# Patient Record
Sex: Female | Born: 1945 | Race: White | Hispanic: No | Smoking: Former smoker
Health system: Southern US, Community
[De-identification: ages and names within clinical notes are randomized; demographics above are authoritative.]

## PROBLEM LIST (undated history)

## (undated) DIAGNOSIS — K5790 Diverticulosis of intestine, part unspecified, without perforation or abscess without bleeding: Secondary | ICD-10-CM

## (undated) DIAGNOSIS — R32 Unspecified urinary incontinence: Secondary | ICD-10-CM

## (undated) DIAGNOSIS — Z86018 Personal history of other benign neoplasm: Secondary | ICD-10-CM

## (undated) DIAGNOSIS — K219 Gastro-esophageal reflux disease without esophagitis: Secondary | ICD-10-CM

## (undated) DIAGNOSIS — M810 Age-related osteoporosis without current pathological fracture: Secondary | ICD-10-CM

## (undated) DIAGNOSIS — T783XXA Angioneurotic edema, initial encounter: Secondary | ICD-10-CM

## (undated) DIAGNOSIS — G4730 Sleep apnea, unspecified: Secondary | ICD-10-CM

## (undated) DIAGNOSIS — G4733 Obstructive sleep apnea (adult) (pediatric): Secondary | ICD-10-CM

## (undated) DIAGNOSIS — K449 Diaphragmatic hernia without obstruction or gangrene: Secondary | ICD-10-CM

## (undated) DIAGNOSIS — K635 Polyp of colon: Secondary | ICD-10-CM

## (undated) DIAGNOSIS — L719 Rosacea, unspecified: Secondary | ICD-10-CM

## (undated) DIAGNOSIS — F329 Major depressive disorder, single episode, unspecified: Secondary | ICD-10-CM

## (undated) DIAGNOSIS — I10 Essential (primary) hypertension: Secondary | ICD-10-CM

## (undated) DIAGNOSIS — E7800 Pure hypercholesterolemia, unspecified: Secondary | ICD-10-CM

## (undated) HISTORY — DX: Gastro-esophageal reflux disease without esophagitis: K21.9

## (undated) HISTORY — DX: Obstructive sleep apnea (adult) (pediatric): G47.33

## (undated) HISTORY — DX: Pure hypercholesterolemia, unspecified: E78.00

## (undated) HISTORY — DX: Rosacea, unspecified: L71.9

## (undated) HISTORY — PX: CATARACT EXTRACTION: SUR2

## (undated) HISTORY — DX: Major depressive disorder, single episode, unspecified: F32.9

## (undated) HISTORY — DX: Polyp of colon: K63.5

## (undated) HISTORY — DX: Angioneurotic edema, initial encounter: T78.3XXA

## (undated) HISTORY — DX: Essential (primary) hypertension: I10

## (undated) HISTORY — DX: Age-related osteoporosis without current pathological fracture: M81.0

## (undated) HISTORY — DX: Unspecified urinary incontinence: R32

## (undated) HISTORY — DX: Sleep apnea, unspecified: G47.30

## (undated) HISTORY — PX: SKIN CANCER EXCISION: SHX779

## (undated) HISTORY — DX: Diaphragmatic hernia without obstruction or gangrene: K44.9

## (undated) HISTORY — PX: KNEE ARTHROSCOPY: SUR90

## (undated) HISTORY — PX: BUNIONECTOMY: SHX129

## (undated) HISTORY — DX: Personal history of other benign neoplasm: Z86.018

## (undated) HISTORY — PX: CHOLECYSTECTOMY: SHX55

## (undated) HISTORY — DX: Diverticulosis of intestine, part unspecified, without perforation or abscess without bleeding: K57.90

---

## 1978-12-19 HISTORY — PX: OTHER SURGICAL HISTORY: SHX169

## 1999-11-23 ENCOUNTER — Other Ambulatory Visit: Admission: RE | Admit: 1999-11-23 | Discharge: 1999-11-23 | Payer: Self-pay | Admitting: Internal Medicine

## 2001-03-02 ENCOUNTER — Other Ambulatory Visit: Admission: RE | Admit: 2001-03-02 | Discharge: 2001-03-02 | Payer: Self-pay | Admitting: Internal Medicine

## 2001-03-06 ENCOUNTER — Encounter (INDEPENDENT_AMBULATORY_CARE_PROVIDER_SITE_OTHER): Payer: Self-pay | Admitting: *Deleted

## 2001-03-06 ENCOUNTER — Ambulatory Visit (HOSPITAL_COMMUNITY): Admission: RE | Admit: 2001-03-06 | Discharge: 2001-03-06 | Payer: Self-pay | Admitting: Internal Medicine

## 2001-03-13 ENCOUNTER — Ambulatory Visit (HOSPITAL_COMMUNITY): Admission: RE | Admit: 2001-03-13 | Discharge: 2001-03-13 | Payer: Self-pay | Admitting: Internal Medicine

## 2001-03-13 ENCOUNTER — Encounter: Payer: Self-pay | Admitting: Internal Medicine

## 2001-03-13 ENCOUNTER — Encounter (INDEPENDENT_AMBULATORY_CARE_PROVIDER_SITE_OTHER): Payer: Self-pay | Admitting: *Deleted

## 2001-04-19 HISTORY — PX: TUMOR REMOVAL: SHX12

## 2001-05-15 ENCOUNTER — Encounter: Payer: Self-pay | Admitting: Surgery

## 2001-05-23 ENCOUNTER — Inpatient Hospital Stay (HOSPITAL_COMMUNITY): Admission: RE | Admit: 2001-05-23 | Discharge: 2001-05-27 | Payer: Self-pay | Admitting: Surgery

## 2001-05-23 ENCOUNTER — Encounter (INDEPENDENT_AMBULATORY_CARE_PROVIDER_SITE_OTHER): Payer: Self-pay | Admitting: Specialist

## 2001-11-03 LAB — HM COLONOSCOPY

## 2002-05-09 ENCOUNTER — Encounter: Payer: Self-pay | Admitting: Surgery

## 2002-05-09 ENCOUNTER — Encounter: Admission: RE | Admit: 2002-05-09 | Discharge: 2002-05-09 | Payer: Self-pay | Admitting: Surgery

## 2002-08-02 ENCOUNTER — Other Ambulatory Visit: Admission: RE | Admit: 2002-08-02 | Discharge: 2002-08-02 | Payer: Self-pay | Admitting: Endocrinology

## 2002-08-21 ENCOUNTER — Ambulatory Visit (HOSPITAL_BASED_OUTPATIENT_CLINIC_OR_DEPARTMENT_OTHER): Admission: RE | Admit: 2002-08-21 | Discharge: 2002-08-21 | Payer: Self-pay | Admitting: Endocrinology

## 2002-11-15 ENCOUNTER — Ambulatory Visit (HOSPITAL_BASED_OUTPATIENT_CLINIC_OR_DEPARTMENT_OTHER): Admission: RE | Admit: 2002-11-15 | Discharge: 2002-11-15 | Payer: Self-pay | Admitting: Pulmonary Disease

## 2002-12-14 ENCOUNTER — Encounter: Payer: Self-pay | Admitting: Endocrinology

## 2002-12-14 ENCOUNTER — Encounter: Admission: RE | Admit: 2002-12-14 | Discharge: 2002-12-14 | Payer: Self-pay | Admitting: Endocrinology

## 2003-12-24 ENCOUNTER — Encounter: Admission: RE | Admit: 2003-12-24 | Discharge: 2003-12-24 | Payer: Self-pay | Admitting: Endocrinology

## 2004-11-23 ENCOUNTER — Ambulatory Visit (HOSPITAL_COMMUNITY): Admission: RE | Admit: 2004-11-23 | Discharge: 2004-11-23 | Payer: Self-pay | Admitting: Orthopedic Surgery

## 2005-01-12 ENCOUNTER — Ambulatory Visit: Payer: Self-pay | Admitting: Endocrinology

## 2005-01-19 ENCOUNTER — Ambulatory Visit: Payer: Self-pay | Admitting: Endocrinology

## 2005-01-28 ENCOUNTER — Ambulatory Visit: Payer: Self-pay | Admitting: Endocrinology

## 2005-02-05 ENCOUNTER — Encounter: Admission: RE | Admit: 2005-02-05 | Discharge: 2005-02-05 | Payer: Self-pay | Admitting: Endocrinology

## 2005-02-09 ENCOUNTER — Ambulatory Visit: Payer: Self-pay | Admitting: Pulmonary Disease

## 2006-01-31 ENCOUNTER — Ambulatory Visit: Payer: Self-pay | Admitting: Endocrinology

## 2006-01-31 LAB — CONVERTED CEMR LAB
Albumin: 3.9 g/dL (ref 3.5–5.2)
Basophils Absolute: 0 10*3/uL (ref 0.0–0.1)
CO2: 31 meq/L (ref 19–32)
Chol/HDL Ratio, serum: 2.3
Creatinine, Ser: 0.8 mg/dL (ref 0.4–1.2)
Hemoglobin: 14 g/dL (ref 12.0–15.0)
Ketones, ur: NEGATIVE mg/dL
LDL Cholesterol: 52 mg/dL (ref 0–99)
MCHC: 33.6 g/dL (ref 30.0–36.0)
Monocytes Absolute: 0.7 10*3/uL (ref 0.2–0.7)
Monocytes Relative: 14.4 % — ABNORMAL HIGH (ref 3.0–11.0)
Neutro Abs: 2.5 10*3/uL (ref 1.4–7.7)
Neutrophils Relative %: 47.8 % (ref 43.0–77.0)
Nitrite: NEGATIVE
RBC: 4.36 M/uL (ref 3.87–5.11)
RDW: 12.9 % (ref 11.5–14.6)
Sodium: 142 meq/L (ref 135–145)
Specific Gravity, Urine: 1.02 (ref 1.000–1.03)
Total Bilirubin: 0.7 mg/dL (ref 0.3–1.2)
Total Protein, Urine: NEGATIVE mg/dL
Urine Glucose: NEGATIVE mg/dL

## 2006-02-07 ENCOUNTER — Encounter: Admission: RE | Admit: 2006-02-07 | Discharge: 2006-02-07 | Payer: Self-pay | Admitting: Endocrinology

## 2006-02-07 ENCOUNTER — Ambulatory Visit: Payer: Self-pay | Admitting: Endocrinology

## 2006-02-07 HISTORY — PX: ELECTROCARDIOGRAM: SHX264

## 2006-02-14 ENCOUNTER — Encounter: Payer: Self-pay | Admitting: Endocrinology

## 2006-02-14 ENCOUNTER — Ambulatory Visit: Payer: Self-pay

## 2006-02-14 HISTORY — PX: OTHER SURGICAL HISTORY: SHX169

## 2006-02-22 ENCOUNTER — Encounter: Admission: RE | Admit: 2006-02-22 | Discharge: 2006-02-22 | Payer: Self-pay | Admitting: Endocrinology

## 2006-04-18 ENCOUNTER — Ambulatory Visit: Payer: Self-pay | Admitting: Endocrinology

## 2006-07-18 ENCOUNTER — Ambulatory Visit: Payer: Self-pay | Admitting: Endocrinology

## 2006-08-27 ENCOUNTER — Ambulatory Visit: Payer: Self-pay | Admitting: Family Medicine

## 2006-10-19 ENCOUNTER — Ambulatory Visit: Payer: Self-pay | Admitting: Internal Medicine

## 2006-11-17 ENCOUNTER — Encounter: Payer: Self-pay | Admitting: Internal Medicine

## 2006-11-17 ENCOUNTER — Ambulatory Visit: Payer: Self-pay | Admitting: Internal Medicine

## 2006-11-17 HISTORY — PX: ESOPHAGOGASTRODUODENOSCOPY: SHX1529

## 2006-11-30 ENCOUNTER — Ambulatory Visit (HOSPITAL_BASED_OUTPATIENT_CLINIC_OR_DEPARTMENT_OTHER): Admission: RE | Admit: 2006-11-30 | Discharge: 2006-11-30 | Payer: Self-pay | Admitting: Orthopedic Surgery

## 2006-12-03 ENCOUNTER — Encounter: Payer: Self-pay | Admitting: Endocrinology

## 2006-12-03 DIAGNOSIS — F329 Major depressive disorder, single episode, unspecified: Secondary | ICD-10-CM

## 2006-12-03 DIAGNOSIS — E039 Hypothyroidism, unspecified: Secondary | ICD-10-CM

## 2006-12-03 DIAGNOSIS — R32 Unspecified urinary incontinence: Secondary | ICD-10-CM

## 2006-12-03 DIAGNOSIS — F325 Major depressive disorder, single episode, in full remission: Secondary | ICD-10-CM

## 2006-12-03 DIAGNOSIS — Z860100 Personal history of colon polyps, unspecified: Secondary | ICD-10-CM | POA: Insufficient documentation

## 2006-12-03 DIAGNOSIS — I10 Essential (primary) hypertension: Secondary | ICD-10-CM

## 2006-12-03 DIAGNOSIS — N3281 Overactive bladder: Secondary | ICD-10-CM

## 2006-12-03 DIAGNOSIS — M810 Age-related osteoporosis without current pathological fracture: Secondary | ICD-10-CM

## 2006-12-03 DIAGNOSIS — K635 Polyp of colon: Secondary | ICD-10-CM

## 2006-12-03 DIAGNOSIS — F3289 Other specified depressive episodes: Secondary | ICD-10-CM

## 2006-12-03 HISTORY — DX: Age-related osteoporosis without current pathological fracture: M81.0

## 2006-12-03 HISTORY — DX: Major depressive disorder, single episode, unspecified: F32.9

## 2006-12-03 HISTORY — DX: Polyp of colon: K63.5

## 2006-12-03 HISTORY — DX: Unspecified urinary incontinence: R32

## 2006-12-03 HISTORY — DX: Essential (primary) hypertension: I10

## 2006-12-03 HISTORY — DX: Other specified depressive episodes: F32.89

## 2007-02-27 ENCOUNTER — Ambulatory Visit: Payer: Self-pay | Admitting: Endocrinology

## 2007-02-27 LAB — CONVERTED CEMR LAB
ALT: 27 units/L (ref 0–35)
AST: 28 units/L (ref 0–37)
Basophils Relative: 0.9 % (ref 0.0–1.0)
Bilirubin, Direct: 0.1 mg/dL (ref 0.0–0.3)
CO2: 31 meq/L (ref 19–32)
Calcium: 9.3 mg/dL (ref 8.4–10.5)
Chloride: 103 meq/L (ref 96–112)
Cholesterol: 142 mg/dL (ref 0–200)
Creatinine, Ser: 0.6 mg/dL (ref 0.4–1.2)
Eosinophils Relative: 4.5 % (ref 0.0–5.0)
Glucose, Bld: 94 mg/dL (ref 70–99)
HCT: 39 % (ref 36.0–46.0)
Ketones, ur: NEGATIVE mg/dL
LDL Cholesterol: 61 mg/dL (ref 0–99)
MCV: 93 fL (ref 78.0–100.0)
Neutrophils Relative %: 48.3 % (ref 43.0–77.0)
Nitrite: NEGATIVE
RBC: 4.19 M/uL (ref 3.87–5.11)
RDW: 12.7 % (ref 11.5–14.6)
Sodium: 141 meq/L (ref 135–145)
Specific Gravity, Urine: 1.02 (ref 1.000–1.03)
TSH: 3.64 microintl units/mL (ref 0.35–5.50)
Total Bilirubin: 0.7 mg/dL (ref 0.3–1.2)
Total Protein, Urine: NEGATIVE mg/dL
Urobilinogen, UA: 0.2 (ref 0.0–1.0)
WBC: 4.1 10*3/uL — ABNORMAL LOW (ref 4.5–10.5)
pH: 6 (ref 5.0–8.0)

## 2007-03-03 ENCOUNTER — Ambulatory Visit: Payer: Self-pay | Admitting: Endocrinology

## 2007-03-03 ENCOUNTER — Other Ambulatory Visit: Admission: RE | Admit: 2007-03-03 | Discharge: 2007-03-03 | Payer: Self-pay | Admitting: Endocrinology

## 2007-03-03 ENCOUNTER — Encounter: Payer: Self-pay | Admitting: Endocrinology

## 2007-03-03 DIAGNOSIS — K219 Gastro-esophageal reflux disease without esophagitis: Secondary | ICD-10-CM | POA: Insufficient documentation

## 2007-03-03 HISTORY — DX: Gastro-esophageal reflux disease without esophagitis: K21.9

## 2007-03-09 ENCOUNTER — Ambulatory Visit: Payer: Self-pay | Admitting: Internal Medicine

## 2007-03-09 ENCOUNTER — Encounter: Payer: Self-pay | Admitting: Endocrinology

## 2007-04-21 ENCOUNTER — Encounter: Admission: RE | Admit: 2007-04-21 | Discharge: 2007-04-21 | Payer: Self-pay | Admitting: Endocrinology

## 2008-01-06 ENCOUNTER — Ambulatory Visit: Payer: Self-pay | Admitting: Family Medicine

## 2008-01-16 ENCOUNTER — Ambulatory Visit: Payer: Self-pay | Admitting: Endocrinology

## 2008-02-27 ENCOUNTER — Ambulatory Visit: Payer: Self-pay | Admitting: Endocrinology

## 2008-02-28 LAB — CONVERTED CEMR LAB
ALT: 28 units/L (ref 0–35)
AST: 26 units/L (ref 0–37)
Albumin: 4 g/dL (ref 3.5–5.2)
Alkaline Phosphatase: 64 units/L (ref 39–117)
BUN: 17 mg/dL (ref 6–23)
Bilirubin Urine: NEGATIVE
Bilirubin, Direct: 0.1 mg/dL (ref 0.0–0.3)
CO2: 30 meq/L (ref 19–32)
Chloride: 107 meq/L (ref 96–112)
Eosinophils Relative: 4.9 % (ref 0.0–5.0)
Glucose, Bld: 99 mg/dL (ref 70–99)
HCT: 39.9 % (ref 36.0–46.0)
Hemoglobin, Urine: NEGATIVE
Leukocytes, UA: NEGATIVE
Lymphocytes Relative: 33.6 % (ref 12.0–46.0)
Monocytes Absolute: 0.7 10*3/uL (ref 0.1–1.0)
Monocytes Relative: 12.3 % — ABNORMAL HIGH (ref 3.0–12.0)
Nitrite: NEGATIVE
Platelets: 219 10*3/uL (ref 150–400)
Potassium: 4.4 meq/L (ref 3.5–5.1)
RDW: 12.9 % (ref 11.5–14.6)
Sodium: 143 meq/L (ref 135–145)
Total CHOL/HDL Ratio: 3.1
Total Protein, Urine: NEGATIVE mg/dL
Total Protein: 6.5 g/dL (ref 6.0–8.3)
VLDL: 38 mg/dL (ref 0–40)
WBC: 5.3 10*3/uL (ref 4.5–10.5)
pH: 5.5 (ref 5.0–8.0)

## 2008-03-04 ENCOUNTER — Ambulatory Visit: Payer: Self-pay | Admitting: Endocrinology

## 2008-03-19 ENCOUNTER — Encounter: Payer: Self-pay | Admitting: Endocrinology

## 2008-05-11 ENCOUNTER — Ambulatory Visit: Payer: Self-pay | Admitting: Family Medicine

## 2008-05-14 ENCOUNTER — Encounter: Admission: RE | Admit: 2008-05-14 | Discharge: 2008-05-14 | Payer: Self-pay | Admitting: Endocrinology

## 2008-05-22 ENCOUNTER — Telehealth (INDEPENDENT_AMBULATORY_CARE_PROVIDER_SITE_OTHER): Payer: Self-pay | Admitting: *Deleted

## 2008-05-27 ENCOUNTER — Ambulatory Visit: Payer: Self-pay | Admitting: Endocrinology

## 2008-08-22 ENCOUNTER — Encounter: Payer: Self-pay | Admitting: Endocrinology

## 2008-09-04 ENCOUNTER — Telehealth (INDEPENDENT_AMBULATORY_CARE_PROVIDER_SITE_OTHER): Payer: Self-pay | Admitting: *Deleted

## 2008-09-13 ENCOUNTER — Encounter (INDEPENDENT_AMBULATORY_CARE_PROVIDER_SITE_OTHER): Payer: Self-pay | Admitting: *Deleted

## 2008-12-14 ENCOUNTER — Ambulatory Visit: Payer: Self-pay | Admitting: Internal Medicine

## 2009-02-28 ENCOUNTER — Ambulatory Visit: Payer: Self-pay | Admitting: Endocrinology

## 2009-03-05 ENCOUNTER — Ambulatory Visit: Payer: Self-pay | Admitting: Endocrinology

## 2009-03-06 LAB — CONVERTED CEMR LAB
ALT: 38 units/L — ABNORMAL HIGH (ref 0–35)
Albumin: 4.2 g/dL (ref 3.5–5.2)
Alkaline Phosphatase: 69 units/L (ref 39–117)
Basophils Relative: 0.1 % (ref 0.0–3.0)
Bilirubin Urine: NEGATIVE
Bilirubin, Direct: 0.1 mg/dL (ref 0.0–0.3)
CO2: 28 meq/L (ref 19–32)
Chloride: 107 meq/L (ref 96–112)
Creatinine, Ser: 0.6 mg/dL (ref 0.4–1.2)
Eosinophils Relative: 0.1 % (ref 0.0–5.0)
Hemoglobin, Urine: NEGATIVE
Hemoglobin: 13.6 g/dL (ref 12.0–15.0)
LDL Cholesterol: 71 mg/dL (ref 0–99)
Lymphocytes Relative: 11.1 % — ABNORMAL LOW (ref 12.0–46.0)
MCV: 96.6 fL (ref 78.0–100.0)
Monocytes Absolute: 1.1 10*3/uL — ABNORMAL HIGH (ref 0.1–1.0)
Neutro Abs: 12.6 10*3/uL — ABNORMAL HIGH (ref 1.4–7.7)
Neutrophils Relative %: 81.3 % — ABNORMAL HIGH (ref 43.0–77.0)
Potassium: 4.5 meq/L (ref 3.5–5.1)
RBC: 4.11 M/uL (ref 3.87–5.11)
Sodium: 144 meq/L (ref 135–145)
Total CHOL/HDL Ratio: 2
Total Protein, Urine: NEGATIVE mg/dL
Total Protein: 6.9 g/dL (ref 6.0–8.3)
Triglycerides: 116 mg/dL (ref 0.0–149.0)
Urine Glucose: NEGATIVE mg/dL
WBC: 15.4 10*3/uL — ABNORMAL HIGH (ref 4.5–10.5)

## 2009-03-10 ENCOUNTER — Ambulatory Visit: Payer: Self-pay | Admitting: Endocrinology

## 2009-03-10 ENCOUNTER — Telehealth (INDEPENDENT_AMBULATORY_CARE_PROVIDER_SITE_OTHER): Payer: Self-pay | Admitting: *Deleted

## 2009-03-21 ENCOUNTER — Telehealth: Payer: Self-pay | Admitting: Endocrinology

## 2009-05-06 ENCOUNTER — Ambulatory Visit: Payer: Self-pay | Admitting: Endocrinology

## 2009-05-06 ENCOUNTER — Ambulatory Visit: Payer: Self-pay | Admitting: Family Medicine

## 2009-05-13 ENCOUNTER — Telehealth: Payer: Self-pay | Admitting: Internal Medicine

## 2009-08-07 ENCOUNTER — Encounter: Admission: RE | Admit: 2009-08-07 | Discharge: 2009-08-07 | Payer: Self-pay | Admitting: Endocrinology

## 2009-08-12 ENCOUNTER — Encounter (INDEPENDENT_AMBULATORY_CARE_PROVIDER_SITE_OTHER): Payer: Self-pay

## 2009-08-12 ENCOUNTER — Ambulatory Visit: Payer: Self-pay | Admitting: Internal Medicine

## 2009-08-22 ENCOUNTER — Ambulatory Visit: Payer: Self-pay | Admitting: Internal Medicine

## 2009-08-22 DIAGNOSIS — T783XXA Angioneurotic edema, initial encounter: Secondary | ICD-10-CM

## 2009-08-22 HISTORY — DX: Angioneurotic edema, initial encounter: T78.3XXA

## 2009-08-22 LAB — HM COLONOSCOPY

## 2009-08-25 ENCOUNTER — Encounter: Payer: Self-pay | Admitting: Internal Medicine

## 2009-12-03 ENCOUNTER — Ambulatory Visit: Payer: Self-pay | Admitting: Internal Medicine

## 2009-12-04 ENCOUNTER — Telehealth: Payer: Self-pay | Admitting: Internal Medicine

## 2009-12-11 ENCOUNTER — Ambulatory Visit: Payer: Self-pay | Admitting: Endocrinology

## 2009-12-11 ENCOUNTER — Telehealth: Payer: Self-pay | Admitting: Endocrinology

## 2009-12-11 DIAGNOSIS — T783XXA Angioneurotic edema, initial encounter: Secondary | ICD-10-CM | POA: Insufficient documentation

## 2009-12-12 LAB — CONVERTED CEMR LAB
Calcium: 9.5 mg/dL (ref 8.4–10.5)
Creatinine, Ser: 0.8 mg/dL (ref 0.4–1.2)
GFR calc non Af Amer: 80.02 mL/min (ref >60)

## 2009-12-13 ENCOUNTER — Encounter: Payer: Self-pay | Admitting: Endocrinology

## 2009-12-15 ENCOUNTER — Telehealth: Payer: Self-pay | Admitting: Endocrinology

## 2009-12-16 ENCOUNTER — Telehealth: Payer: Self-pay | Admitting: Endocrinology

## 2009-12-19 ENCOUNTER — Ambulatory Visit (HOSPITAL_COMMUNITY): Admission: RE | Admit: 2009-12-19 | Discharge: 2009-12-19 | Payer: Self-pay | Admitting: Endocrinology

## 2010-01-01 ENCOUNTER — Telehealth: Payer: Self-pay | Admitting: Endocrinology

## 2010-01-02 ENCOUNTER — Ambulatory Visit: Payer: Self-pay | Admitting: Endocrinology

## 2010-01-08 ENCOUNTER — Ambulatory Visit: Payer: Self-pay | Admitting: Internal Medicine

## 2010-01-08 DIAGNOSIS — L719 Rosacea, unspecified: Secondary | ICD-10-CM

## 2010-01-08 DIAGNOSIS — L299 Pruritus, unspecified: Secondary | ICD-10-CM

## 2010-01-08 HISTORY — DX: Rosacea, unspecified: L71.9

## 2010-01-22 LAB — CONVERTED CEMR LAB: IgE (Immunoglobulin E), Serum: 7.2 intl units/mL (ref 0.0–180.0)

## 2010-02-23 ENCOUNTER — Ambulatory Visit: Payer: Self-pay | Admitting: Endocrinology

## 2010-02-23 DIAGNOSIS — E7800 Pure hypercholesterolemia, unspecified: Secondary | ICD-10-CM

## 2010-02-23 HISTORY — DX: Pure hypercholesterolemia, unspecified: E78.00

## 2010-02-23 LAB — CONVERTED CEMR LAB
AST: 29 units/L (ref 0–37)
Albumin: 4 g/dL (ref 3.5–5.2)
Alkaline Phosphatase: 53 units/L (ref 39–117)
Basophils Absolute: 0 10*3/uL (ref 0.0–0.1)
Calcium: 9.1 mg/dL (ref 8.4–10.5)
Direct LDL: 92.9 mg/dL
GFR calc non Af Amer: 113.14 mL/min (ref >60)
Glucose, Bld: 102 mg/dL — ABNORMAL HIGH (ref 70–99)
HDL: 57.6 mg/dL (ref >39.00)
Hemoglobin, Urine: NEGATIVE
Hemoglobin: 13.7 g/dL (ref 12.0–15.0)
Leukocytes, UA: NEGATIVE
Lymphocytes Relative: 28.8 % (ref 12.0–46.0)
Monocytes Relative: 13.7 % — ABNORMAL HIGH (ref 3.0–12.0)
Neutro Abs: 2.7 10*3/uL (ref 1.4–7.7)
Nitrite: NEGATIVE
RBC: 4.18 M/uL (ref 3.87–5.11)
RDW: 13.9 % (ref 11.5–14.6)
Sodium: 141 meq/L (ref 135–145)
Urobilinogen, UA: 0.2 (ref 0.0–1.0)
VLDL: 61 mg/dL — ABNORMAL HIGH (ref 0.0–40.0)
WBC: 5.3 10*3/uL (ref 4.5–10.5)

## 2010-05-17 LAB — CONVERTED CEMR LAB
ALT: 31 units/L (ref 0–35)
AST: 25 units/L (ref 0–37)
Albumin: 3.9 g/dL (ref 3.5–5.2)
Alkaline Phosphatase: 63 units/L (ref 39–117)
Total Protein: 6.3 g/dL (ref 6.0–8.3)

## 2010-05-19 NOTE — Miscellaneous (Signed)
Summary: Lec previsit  Clinical Lists Changes  Medications: Added new medication of MIRALAX   POWD (POLYETHYLENE GLYCOL 3350) As per prep  instructions. - Signed Added new medication of REGLAN 10 MG  TABS (METOCLOPRAMIDE HCL) As per prep instructions. - Signed Added new medication of DULCOLAX 5 MG  TBEC (BISACODYL) Day before procedure take 2 at 3pm and 2 at 8pm. - Signed Rx of MIRALAX   POWD (POLYETHYLENE GLYCOL 3350) As per prep  instructions.;  #255gm x 0;  Signed;  Entered by: Ulis Rias RN;  Authorized by: Hart Carwin MD;  Method used: Electronically to [facility] [phone]*, [address] [place], [place], [place]  [address], Ph: [phone] or [phone], Fax: [phone] Rx of REGLAN 10 MG  TABS (METOCLOPRAMIDE HCL) As per prep instructions.;  #2 x 0;  Signed;  Entered by: Ulis Rias RN;  Authorized by: Hart Carwin MD;  Method used: Electronically to [facility] [phone]*, [address] [place], [place], [place]  [address], Ph: [phone] or [phone], Fax: [phone] Rx of DULCOLAX 5 MG  TBEC (BISACODYL) Day before procedure take 2 at 3pm and 2 at 8pm.;  #4 x 0;  Signed;  Entered by: Ulis Rias RN;  Authorized by: Hart Carwin MD;  Method used: Electronically to [facility] [phone]*, [address], [place], [place], [place]  [address], Ph: [phone] or [phone], Fax: [phone] Observations: Added new observation of ALLERGY REV: Done (08/12/2009 12:40)    Prescriptions: DULCOLAX 5 MG  TBEC (BISACODYL) Day before procedure take 2 at 3pm and 2 at 8pm.  #4 x 0   Entered by:   Ulis Rias RN   Authorized by:   Hart Carwin MD   Signed by:   Ulis Rias RN on 08/12/2009   Method used:   Electronically to        [facility] [phone]* (retail)       [address]       [place], [place]  [address]       Ph: [phone] or [phone]       Fax: [phone]   RxID:   [id number] REGLAN 10 MG  TABS  (METOCLOPRAMIDE HCL) As per prep instructions.  #2 x 0   Entered by:   Ulis Rias RN   Authorized by:   Hart Carwin MD   Signed by:   Ulis Rias RN on 08/12/2009   Method used:   Electronically to        [facility] [phone]* (retail)       [address]       [place], [place]  [address]       Ph: [phone] or [phone]       Fax: [phone]   RxID:   [id number] MIRALAX   POWD (POLYETHYLENE GLYCOL 3350) As per prep  instructions.  #255gm x 0   Entered by:   Ulis Rias RN   Authorized by:   Hart Carwin MD   Signed by:   Ulis Rias RN on 08/12/2009   Method used:   Electronically to        [facility] [phone]* (retail)       [address]       [place], [place]  [address]       Ph: [phone] or [phone]  Fax: [phone]   RxID:   [id number]   Appended Document: Lec previsit Pt requested to take the Osmoprep instead of the Miralax prep. Spoke with Dr Juanda Chance and it was OK'd with her as long as the pt signed the waiver. Pt states she understood the risks  and did sign the waiver regarding the side effects with Osmoprep. New RX for Osmoprep called [facility], operational at [facility].Pt will call our office if she has any questions.

## 2010-05-19 NOTE — Progress Notes (Signed)
Summary: rash/dizziness  Phone Note Call from Patient Call back at Home Phone [phone]   Caller: Patient Call For: Minus Breeding MD Reason for Call: Talk to Doctor Summary of Call: Pt left msg on vm came and saw md couple weeks ago for rash. md d/c losartan and rx HCTZ. Pt states rash has came back and she would like to start back on losartan the HCTZ makes her dizzy. Initial call taken by: Orlan Leavens RMA,  January 01, 2010 1:33 PM  Follow-up for Phone Call        i would like to see pt back in the office, as there are several probs:  htn, dizziness, and rash. Follow-up by: Minus Breeding MD,  January 01, 2010 2:10 PM  Additional Follow-up for Phone Call Additional follow up Details #1::        called pt was'nt @ home left msg with husband for her to RTC Additional Follow-up by: Orlan Leavens RMA,  January 01, 2010 2:40 PM    Additional Follow-up for Phone Call Additional follow up Details #2::    Pt return call back. Gave her md recomendation. Pt made appt for tomorrow @ 11:00am Follow-up by: Orlan Leavens RMA,  January 01, 2010 2:56 PM

## 2010-05-19 NOTE — Progress Notes (Signed)
  Phone Note Call from Patient Call back at Home Phone [phone]   Caller: Patient Call For: Dr Yetta Barre Summary of Call: Pt states rash around mouth is far worse today than it was yesterday. Pt had o.v yesterday. Please advise. Initial call taken by: Verdell Face,  December 04, 2009 8:47 AM  Follow-up for Phone Call        increase valtrex to three times a day for 10 days Follow-up by: Etta Grandchild MD,  December 04, 2009 9:03 AM  Additional Follow-up for Phone Call Additional follow up Details #1::        Pt informed  Additional Follow-up by: Lamar Sprinkles, CMA,  December 04, 2009 10:06 AM    New/Updated Medications: VALTREX 500 MG TABS (VALACYCLOVIR HCL) 1 three times a day x 10 days Prescriptions: VALTREX 500 MG TABS (VALACYCLOVIR HCL) 1 three times a day x 10 days  #30 x 0   Entered by:   Lamar Sprinkles, CMA   Authorized by:   Etta Grandchild MD   Signed by:   Lamar Sprinkles, CMA on 12/04/2009   Method used:   Electronically to        [facility] [phone]* (retail)       [address]       [place], [place]  [address]       Ph: [phone] or [phone]       Fax: [phone]   RxID:   [id number]

## 2010-05-19 NOTE — Progress Notes (Signed)
Summary: allergist referral  Phone Note Call from Patient Call back at Home Phone [phone]   Caller: Patient Reason for Call: Referral Summary of Call: Pt called about status of an referral to an allergist Initial call taken by: Brenton Grills MA,  December 15, 2009 4:37 PM  Follow-up for Phone Call        sent Follow-up by: Minus Breeding MD,  December 15, 2009 7:06 PM  Additional Follow-up for Phone Call Additional follow up Details #1::        pt notified Additional Follow-up by: Brenton Grills MA,  December 16, 2009 1:50 PM

## 2010-05-19 NOTE — Progress Notes (Signed)
       New/Updated Medications: CALCIUM CARBONATE 600 MG TABS (CALCIUM CARBONATE) take 1 tablet two times a day VITAMIN D 400 UNIT CAPS (CHOLECALCIFEROL) take 1 by mouth once daily

## 2010-05-19 NOTE — Assessment & Plan Note (Signed)
Summary: allergies/jd   Vital Signs:  Patient profile:   65 year old female Height:      68 inches Weight:      178.25 pounds BMI:     27.20 O2 Sat:      95 % on Room air Pulse rate:   81 / minute BP sitting:   138 / 92  (left arm) Cuff size:   regular  Vitals Entered By: Reynaldo Minium CMA (January 08, 2010 10:20 AM)  O2 Flow:  Room air CC: Allergy Consult-Dr. Roselyn Meier around lips.   Primary Demarr Kluever/Referring Reena Borromeo:  ellison  CC:  Allergy Consult-Dr. Roselyn Meier around lips..  History of Present Illness: January 08, 2010- 63 yoF seen in allergy consultation at kind request of FDr Everardo All, mostly concerned about a skin problem with bumps around her lip. This is new in past 6-8 weeks. Mostly bumps on upper lip and corners of mouth without scab or skin break, itch or burn. Not in mouth or nose. May have had something similar in the past once, self-limited then. No benefit from Valtrex and not obviously related to sun exposure, cosmetics or foods. Denies hx allergic skin problems otherwise and no hx of unusually sensitive skin. Prednisone pak helped temmporarily. OTC antihistamine may have helped some. Valtrex for genital herpes w/ no effect on this rash. Denies anemia or unusual diet. .Incidental mild pollen rhinitis in Spring.  Preventive Screening-Counseling & Management  Alcohol-Tobacco     Smoking Status: quit     Year Started: 30 years 2 ppd     Year Quit: 05-22-2001  Current Medications (verified): 1)  Multivitamins   Tabs (Multiple Vitamin) .... Take 1 By Mouth Qd 2)  Ditropan Xl 10 Mg  Tb24 (Oxybutynin Chloride) .... Qd 3)  Prilosec Otc 20 Mg Tbec (Omeprazole Magnesium) .Marland Kitchen.. 1 By Mouth Once Daily 4)  Valtrex 500 Mg Tabs (Valacyclovir Hcl) .Marland Kitchen.. 1 Qd 5)  Prednisone (Pak) 10 Mg Tabs (Prednisone) .... 5-Day Pack, As Dir 6)  Vitamin D 400 Unit Caps (Cholecalciferol) .... Take 1 By Mouth Once Daily 7)  Amlodipine Besylate 2.5 Mg Tabs (Amlodipine Besylate) .Marland Kitchen.. 1 Tab  Once Daily 8)  Claritin-D 24 Hour 10-240 Mg Xr24h-Tab (Loratadine-Pseudoephedrine) .... Take 1 By Mouth Once Daily As Needed Allergies 9)  Aspirin 81 Mg Tbec (Aspirin) .... Take 1 By Mouth Once Daily 10)  Vitamin C 1200mg  .... Take 1 By Mouth Once Daily  Allergies (verified): 1)  ! Doxycycline 2)  ! Cozaar (Losartan Potassium) 3)  ! * Shrimp  Past History:  Family History: Last updated: 03/10/2009 father had colon cancer at an advanced age brother died from cancer involving the pancreas and liver. mother has melanoma  Social History: Last updated: 01/08/2010 retired - Engineering geologist and travel married, 1 dog Drug use-no Regular exercise-yes Patient states former smoker. 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 Hx of nasal septal reconstruction in the 1980's for deviated septum. 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 Quit 05-22-2001 Smoking Status:  quit  Review of Systems      See HPI       The patient complains of headaches, nasal congestion/difficulty breathing through nose, sneezing, itching, joint stiffness or pain, and rash.  The patient denies shortness of breath with activity,  shortness of breath at rest, productive cough, non-productive cough, coughing up blood, chest pain, irregular heartbeats, acid heartburn, indigestion, loss of appetite, weight change, abdominal pain, difficulty swallowing, sore throat, tooth/dental problems, ear ache, anxiety, depression, hand/feet swelling, change in color of mucus, and fever.    Physical Exam  Additional Exam:  General: A/Ox3; pleasant and cooperative, NAD, wdwn SKIN: no rash, lesions. No rash evident to me on upper lip now. Minimal 1-2 small red spots upper lip NODES: no lymphadenopathy HEENT: Malone/AT, EOM- WNL, Conjuctivae- clear, PERRLA, TM-WNL, Nose- clear, Throat- clear and wnl, Mallampati  II.  Tongue and oral mucosa NL NECK: Supple w/ fair ROM, JVD- none, normal carotid impulses w/o bruits Thyroid- normal to palpation CHEST: Clear to P&A HEART: RRR, no m/g/r heard ABDOMEN: Soft and nl; nml bowel sounds; no organomegaly or masses noted ZOX:WRUE, nl pulses, no edema  NEURO: Grossly intact to observation      Impression & Recommendations:  Problem # 1:  DERMATITIS, PERIORAL (ICD-695.3)  Nonspecific persistent rash. It isn't obviously sun related, allergic or viral, but could be any of those. There is barely any dermatosis evident now. Vitamin deficiency maight give an angular stomatitiis but that isn't quite what she is describing. i would rather have her avoid systemic steroids. We will send a food IgE panel, but have her try a weak topical steroid cream as needed. If this isn't sufficient she already uses Dr Donzetta Starch and is encouraged to see him about this problem.,.  Medications Added to Medication List This Visit: 1)  Claritin-d 24 Hour 10-240 Mg Xr24h-tab (Loratadine-pseudoephedrine) .... Take 1 by mouth once daily as needed allergies 2)  Aspirin 81 Mg Tbec (Aspirin) .... Take 1 by mouth once daily 3)  Vitamin C 1200mg   .... Take 1 by mouth once daily  Other Orders: Consultation Level III (45409) T-Food  Allergy Profile Specific IgE (86003/[id number])  Patient Instructions: 1)  Please schedule a follow-up appointment as needed. 2)  Try using a weak topical otc steroid cream like hydrocortisone when needed. If this isn't sufficient I suggest you see your dermatilogist. 3)  Lab

## 2010-05-19 NOTE — Assessment & Plan Note (Signed)
Summary: LUMP ON HER NECK   Vital Signs:  Patient profile:   65 year old female Weight:      175 pounds Temp:     98.2 degrees F oral Pulse rate:   94 / minute Pulse rhythm:   regular BP sitting:   120 / 80  (left arm) Cuff size:   regular  Vitals Entered By: Mervin Hack CMA (AAMA) (December 14, 2008 10:20 AM)  History of Present Illness: Chief Complaint: lump in neck  Masseuse  noted a lump in her left neck 2 days ago goes regularly for chronic problems--last was about 1 month ago  She was not aware of lump No pain  No fever No mouth sores No teeth problems (regular periodontal exams but no active problems) No ear pain  No other adenopathy that she is aware of   Allergies: 1)  ! Doxycycline 2)  ! * Shrimp  Past History:  Past medical, surgical, family and social histories (including risk factors) reviewed for relevance to current acute and chronic problems.  Past Medical History: Reviewed history from 03/03/2007 and no changes required. Hypertension Hypothyroidism Osteoporosis Urinary incontinence Significant for a benign tumor that weighed approximately 5 pounds and was resected. Hx of nasal septal reconstruction in the 1980's for deviated septum. Depression Dyslipidemia Colonic polyps, hx of quit smoking 2003 GERD with hiatal hernia  Past Surgical History: Reviewed history from 12/03/2006 and no changes required. Cholecystectomy (R) Knee Arothroscopy EGD (11/17/2006) Stress Cardiolite (02/14/2006) EKG (02/07/2006)  Family History: Reviewed history from 03/03/2007 and no changes required. father had colon cancer at an advanced age brother recently died from cancer involving the pancreas and liver. mother has melanoma  Social History: Reviewed history from 01/06/2008 and no changes required. retired  married non smoker   Review of Systems       Weight has been stable appetite is fine  Physical Exam  General:  alert and normal  appearance.   Eyes:  pupils equal and pupils round.   Ears:  R ear normal and L ear normal.   Mouth:  good dentition, no erythema, no exudates, no lesions, and no tongue abnormalities.   Neck:  supple, full ROM, no masses, no thyromegaly, no carotid bruits, and no cervical lymphadenopathy.   Axillary Nodes:  No palpable lymphadenopathy Inguinal Nodes:  No significant adenopathy   Impression & Recommendations:  Problem # 1:  NECK MASS (ICD-784.2) Assessment New felt by masseuse 2 days ago No abnormalities on exam now ?muscle spasm along SCM Reassured her  Complete Medication List: 1)  Multivitamins Tabs (Multiple vitamin) .... Take 1 by mouth qd 2)  Bl Vitamin C 1000 Mg Tabs (Ascorbic acid) .... Take 1 by mouth qd 3)  Zocor 40 Mg Tabs (Simvastatin) .... Take 1 by mouth qd 4)  Ditropan Xl 10 Mg Tb24 (Oxybutynin chloride) .... Qd 5)  Prilosec Otc 20 Mg Tbec (Omeprazole magnesium) .Marland Kitchen.. 1 by mouth once daily 6)  Adult Aspirin Low Strength 81 Mg Tbdp (Aspirin) .... Take 1 by mouth qd 7)  Cozaar 100 Mg Tabs (Losartan potassium) .... Qd 8)  Valtrex 500 Mg Tabs (Valacyclovir hcl) .Marland Kitchen.. 1 qd  Patient Instructions: 1)  Please schedule a follow-up appointment as needed .   Current Allergies (reviewed today): ! DOXYCYCLINE ! * SHRIMP

## 2010-05-19 NOTE — Assessment & Plan Note (Signed)
Summary: rash around mouth/sae/cd   Vital Signs:  Patient profile:   65 year old female Height:      68 inches Weight:      170 pounds BMI:     25.94 O2 Sat:      96 % on Room air Temp:     98.3 degrees F oral Pulse rate:   77 / minute Pulse rhythm:   regular Resp:     16 per minute BP sitting:   124 / 72  (left arm) Cuff size:   large  Vitals Entered By: Rock Nephew CMA (December 03, 2009 8:42 AM)  Nutrition Counseling: Patient's BMI is greater than 25 and therefore counseled on weight management options.  O2 Flow:  Room air CC: swollen lips x 10 days w/ occasional pain Is Patient Diabetic? No Pain Assessment Patient in pain? no       Does patient need assistance? Functional Status Self care Ambulation Normal Comments Per pt fluorouracil and fluticasone were just temp med/la   Primary Care Provider:  ellison  CC:  swollen lips x 10 days w/ occasional pain.  History of Present Illness: New to me she complains of a 10 day hx. of upper and lower lip swelling. The same thing happened about 2 years ago and went away with OTC antihistmaines. This episode has gotten a little better with OTC antihistamines but has not completely resolved. She has a hx. of cold sores but she has not seen any cold sores during the last week. She takes Advil for pain intermittently.  Preventive Screening-Counseling & Management  Alcohol-Tobacco     Alcohol drinks/day: <1     Alcohol type: wine     >5/day in last 3 mos: no     Alcohol Counseling: not indicated; use of alcohol is not excessive or problematic     Feels need to cut down: no     Feels annoyed by complaints: no     Feels guilty re: drinking: no     Needs 'eye opener' in am: no     Smoking Status: quit > 6 months     Tobacco Counseling: to remain off tobacco products  Caffeine-Diet-Exercise     Does Patient Exercise: yes  Hep-HIV-STD-Contraception     Hepatitis Risk: no risk noted     HIV Risk: no risk noted     STD  Risk: no risk noted      Drug Use:  no.    Medications Prior to Update: 1)  Multivitamins   Tabs (Multiple Vitamin) .... Take 1 By Mouth Qd 2)  Zocor 40 Mg  Tabs (Simvastatin) .... Take 1 By Mouth Qd 3)  Ditropan Xl 10 Mg  Tb24 (Oxybutynin Chloride) .... Qd 4)  Prilosec Otc 20 Mg Tbec (Omeprazole Magnesium) .Marland Kitchen.. 1 By Mouth Once Daily 5)  Adult Aspirin Low Strength 81 Mg Tbdp (Aspirin) .... Take 1 By Mouth Qd 6)  Valtrex 500 Mg Tabs (Valacyclovir Hcl) .Marland Kitchen.. 1 Qd 7)  Losartan Potassium 50 Mg Tabs (Losartan Potassium) .Marland Kitchen.. 1 Qd 8)  40 Fluorouracil 5% .... Apply To Legs Twice Daily For 4 Weeks 9)  60 Fluticasone Propionate 0.05 .... Apply To Legs Twice Daily As Needed  Current Medications (verified): 1)  Multivitamins   Tabs (Multiple Vitamin) .... Take 1 By Mouth Qd 2)  Zocor 40 Mg  Tabs (Simvastatin) .... Take 1 By Mouth Qd 3)  Ditropan Xl 10 Mg  Tb24 (Oxybutynin Chloride) .... Qd 4)  Prilosec Otc 20  Mg Tbec (Omeprazole Magnesium) .Marland Kitchen.. 1 By Mouth Once Daily 5)  Adult Aspirin Low Strength 81 Mg Tbdp (Aspirin) .... Take 1 By Mouth Qd 6)  Valtrex 500 Mg Tabs (Valacyclovir Hcl) .Marland Kitchen.. 1 Qd 7)  Losartan Potassium 50 Mg Tabs (Losartan Potassium) .Marland Kitchen.. 1 Qd 8)  40 Fluorouracil 5% .... Apply To Legs Twice Daily For 4 Weeks 9)  60 Fluticasone Propionate 0.05 .... Apply To Legs Twice Daily As Needed 10)  Silenor 6 Mg Tabs (Doxepin Hcl) .... One By Mouth At Bedtime For Allergy  Allergies (verified): 1)  ! Doxycycline 2)  ! 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 Hx of nasal septal reconstruction in the 1980's for deviated septum. Depression Dyslipidemia Colonic polyps, hx of quit smoking 2003 GERD with hiatal hernia  Past Surgical History: Last updated: 03/10/2009 Cholecystectomy (R) Knee Arothroscopy EGD  (11/17/2006) Stress Cardiolite (02/14/2006) EKG (02/07/2006) removal 5-lb benign tumor from abdomen 2003  Family History: Last updated: 03/10/2009 father had colon cancer at an advanced age brother died from cancer involving the pancreas and liver. mother has melanoma  Social History: Last updated: 12/03/2009 retired  married Drug use-no Regular exercise-yes  Risk Factors: Alcohol Use: <1 (12/03/2009) >5 drinks/d w/in last 3 months: no (12/03/2009) Exercise: yes (12/03/2009)  Risk Factors: Smoking Status: quit > 6 months (12/03/2009)  Family History: Reviewed history from 03/10/2009 and no changes required. father had colon cancer at an advanced age brother died from cancer involving the pancreas and liver. mother has melanoma  Social History: Reviewed history from 01/06/2008 and no changes required. retired  married Drug use-no Regular exercise-yes Smoking Status:  quit > 6 months Hepatitis Risk:  no risk noted HIV Risk:  no risk noted STD Risk:  no risk noted Drug Use:  no Does Patient Exercise:  yes  Review of Systems  The patient denies anorexia, fever, weight loss, weight gain, chest pain, syncope, dyspnea on exertion, peripheral edema, prolonged cough, headaches, hemoptysis, abdominal pain, suspicious skin lesions, depression, abnormal bleeding, and enlarged lymph nodes.   General:  Denies chills, fatigue, fever, loss of appetite, malaise, sleep disorder, sweats, weakness, and weight loss. Endo:  Denies cold intolerance, excessive hunger, excessive thirst, excessive urination, heat intolerance, polyuria, and weight change.  Physical Exam  General:  alert, well-developed, well-nourished, well-hydrated, appropriate dress, normal appearance, healthy-appearing, cooperative to examination, and good hygiene.   Head:  normocephalic, atraumatic, no abnormalities observed, and no abnormalities palpated.   Eyes:  vision grossly intact, pupils equal, pupils round,  pupils reactive to light, pupils react to accomodation, corneas and lenses clear, and no injection.   Ears:  R ear normal and L ear normal.   Nose:  External nasal examination shows no deformity or inflammation. Nasal mucosa are pink and moist without lesions or exudates. Mouth:  upper and lower lips are mildly and diffusely swollen but there are no discreet lesions, vesicles, excoriations, induration, erythema, or scaling. Neck:  supple, full ROM, no masses, no thyromegaly, no thyroid nodules or tenderness, no JVD, normal carotid upstroke, no carotid bruits, and no cervical lymphadenopathy.   Lungs:  normal respiratory effort, no intercostal retractions, no accessory muscle use, normal breath sounds, no dullness, no fremitus, no crackles, and no wheezes.   Heart:  normal rate, regular rhythm, no murmur, no gallop, and no rub.   Abdomen:  soft, non-tender, normal bowel sounds,  no distention, no masses, no guarding, no rigidity, no rebound tenderness, no abdominal hernia, no inguinal hernia, no hepatomegaly, and no splenomegaly.   Msk:  normal ROM, no joint tenderness, no joint swelling, no joint warmth, no redness over joints, no joint deformities, no joint instability, and no crepitation.   Pulses:  R and L carotid,radial,femoral,dorsalis pedis and posterior tibial pulses are full and equal bilaterally Extremities:  No clubbing, cyanosis, edema, or deformity noted with normal full range of motion of all joints.   Neurologic:  No cranial nerve deficits noted. Station and gait are normal. Plantar reflexes are down-going bilaterally. DTRs are symmetrical throughout. Sensory, motor and coordinative functions appear intact. Skin:  turgor normal, color normal, no rashes, no suspicious lesions, no ecchymoses, no petechiae, no purpura, no ulcerations, and no edema.   Cervical Nodes:  no anterior cervical adenopathy and no posterior cervical adenopathy.   Axillary Nodes:  no R axillary adenopathy and no L  axillary adenopathy.   Inguinal Nodes:  no R inguinal adenopathy and no L inguinal adenopathy.   Psych:  Cognition and judgment appear intact. Alert and cooperative with normal attention span and concentration. 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) Assessment New I asked her to stop all nsaids and alcohol, will try doxepin for the powerful antihistamine effetcs, check labs to look for secondary causes Orders: Venipuncture (82956) TLB-BMP (Basic Metabolic Panel-BMET) (80048-METABOL) TLB-CBC Platelet - w/Differential (85025-CBCD) TLB-Hepatic/Liver Function Pnl (80076-HEPATIC) TLB-TSH (Thyroid Stimulating Hormone) (84443-TSH) TLB-Sedimentation Rate (ESR) (85652-ESR)  Problem # 2:  HYPOTHYROIDISM (ICD-244.9) Assessment: Unchanged  Orders: Venipuncture (21308) TLB-BMP (Basic Metabolic Panel-BMET) (80048-METABOL) TLB-CBC Platelet - w/Differential (85025-CBCD) TLB-Hepatic/Liver Function Pnl (80076-HEPATIC) TLB-TSH (Thyroid Stimulating Hormone) (84443-TSH) TLB-Sedimentation Rate (ESR) (85652-ESR)  Problem # 3:  HYPERTENSION (ICD-401.9) Assessment: Improved  Her updated medication list for this problem includes:    Losartan Potassium 50 Mg Tabs (Losartan potassium) .Marland Kitchen... 1 qd  Orders: Venipuncture (65784) TLB-BMP (Basic Metabolic Panel-BMET) (80048-METABOL) TLB-CBC Platelet - w/Differential (85025-CBCD) TLB-Hepatic/Liver Function Pnl (80076-HEPATIC) TLB-TSH (Thyroid Stimulating Hormone) (84443-TSH) TLB-Sedimentation Rate (ESR) (85652-ESR)  BP today: 124/72 Prior BP: 118/80 (03/10/2009)  Labs Reviewed: K+: 4.5 (03/05/2009) Creat: : 0.6 (03/05/2009)   Chol: 162 (03/05/2009)   HDL: 67.50 (03/05/2009)   LDL: 71 (03/05/2009)   TG: 116.0 (03/05/2009)  Complete Medication List: 1)  Multivitamins Tabs (Multiple vitamin) .... Take 1 by mouth qd 2)  Zocor 40 Mg Tabs (Simvastatin) .... Take 1 by  mouth qd 3)  Ditropan Xl 10 Mg Tb24 (Oxybutynin chloride) .... Qd 4)  Prilosec Otc 20 Mg Tbec (Omeprazole magnesium) .Marland Kitchen.. 1 by mouth once daily 5)  Valtrex 500 Mg Tabs (Valacyclovir hcl) .Marland Kitchen.. 1 qd 6)  Losartan Potassium 50 Mg Tabs (Losartan potassium) .Marland Kitchen.. 1 qd 7)  40 Fluorouracil 5%  .... Apply to legs twice daily for 4 weeks 8)  60 Fluticasone Propionate 0.05  .... Apply to legs twice daily as needed 9)  Silenor 6 Mg Tabs (Doxepin hcl) .... One by mouth at bedtime for allergy  Patient Instructions: 1)  Please schedule a follow-up appointment in 1 month. 2)  Take 650-1000mg  of Tylenol every 4-6 hours as needed for relief of pain or comfort of fever AVOID taking more than 4000mg   in a 24 hour period (can cause liver damage in higher doses). Prescriptions: SILENOR 6 MG TABS (DOXEPIN HCL) One by mouth at bedtime for allergy  #40 x 0   Entered and Authorized by:  Etta Grandchild MD   Signed by:   Etta Grandchild MD on 12/03/2009   Method used:   Samples Given   RxID:   [id number]

## 2010-05-19 NOTE — Miscellaneous (Signed)
Summary: BONE DENSITY  Clinical Lists Changes  Orders: Added new Test order of T-Bone Densitometry (77080) - Signed Added new Test order of T-Lumbar Vertebral Assessment (77082) - Signed 

## 2010-05-19 NOTE — Letter (Signed)
Summary: Patient Notice- Polyp Results  [facility]  [address] [place], [place] [address]   Phone: [phone]  Fax: [phone]        Aug 25, 2009 MRN: [id number]    [facility] [address] [place], [place]  [address]    Dear Ms. WEARIN,  I am pleased to inform you that the colon polyp(s) removed during your recent colonoscopy was (were) found to be benign (no cancer detected) upon pathologic examination.The polyp was hyperplastic ( not precancerous)  I recommend you have a repeat colonoscopy examination in 5_ years to look for recurrent polyps, as having colon polyps increases your risk for having recurrent polyps or even colon cancer in the future.  Should you develop new or worsening symptoms of abdominal pain, bowel habit changes or bleeding from the rectum or bowels, please schedule an evaluation with either your primary care physician or with me.  Additional information/recommendations:  _x_ No further action with gastroenterology is needed at this time. Please      follow-up with your primary care physician for your other healthcare      needs.  __ Please call [phone] to schedule a return visit to review your      situation.  __ Please keep your follow-up visit as already scheduled.  __ Continue treatment plan as outlined the day of your exam.  Please call us if you are having persistent problems or have questions about your condition that have not been fully answered at this time.  Sincerely,  Hart Carwin MD  This letter has been electronically signed by your physician.  Appended Document: Patient Notice- Polyp Results letter mailed 5.11.11

## 2010-05-19 NOTE — Progress Notes (Signed)
Summary: relcast  Phone Note Outgoing Call Call back at Home Phone [phone]   Call placed by: Brenton Grills MA,  December 16, 2009 3:42 PM Action Taken: Appt scheduled Summary of Call: Reclast infusion scheduled 12/19/2009 2:00pm. Pt informed.

## 2010-05-19 NOTE — Procedures (Signed)
Summary: Colonoscopy  Patient: Jean Bowen Note: All result statuses are Final unless otherwise noted.  Tests: (1) Colonoscopy (COL)   COL Colonoscopy           DONE (C)     [facility]     [address]     [place], [place]  [address]           COLONOSCOPY PROCEDURE REPORT           PATIENT:  Jean Bowen, Jean Bowen  MR#:  [id number]     BIRTHDATE:  1945-06-27, 63 yrs. old  GENDER:  female     ENDOSCOPIST:  Hedwig Morton. Juanda Chance, MD     REF. 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  No rectal exam performed. The LB [id number]-[id number]     [id number] endoscope was introduced through the anus and advanced to     the cecum, which was identified by both the appendix and ileocecal     valve, without limitations.  The quality of the prep was good,     using MiraLax.  The instrument was then slowly withdrawn as the     colon was fully examined.     <<PROCEDUREIMAGES>>           FINDINGS:  A sessile polyp was found in the sigmoid colon. 5 mm     flat polyp at 15 cm The polyp was removed using cold biopsy     forceps (see image6).  Moderate diverticulosis was found in the     sigmoid colon (see image2 and image1).  This was otherwise a     normal examination of the colon (see image4, image5, image3, and     image7).   Retroflexed views in the rectum revealed no     abnormalities.    The scope was then withdrawn from the patient     and the procedure completed.           COMPLICATIONS:  None     ENDOSCOPIC IMPRESSION:     1) Sessile polyp in the sigmoid colon     2)  Moderate diverticulosis in the sigmoid colon     3) Otherwise normal examination     RECOMMENDATIONS:     1) Await pathology results     2) High fiber diet.     REPEAT EXAM:  In 5 year(s) for.           ______________________________     Hedwig Morton. Juanda Chance, MD           CC:           n.     REVISED:  08/27/2009 05:20 PM     eSIGNED:   Hedwig Morton. Psalm Schappell at 08/27/2009 05:20 PM           Bobby Rumpf, [phone]  Note: An  exclamation mark (!) indicates a result that was not dispersed into the flowsheet. Document Creation Date: 08/27/2009 5:20 PM _______________________________________________________________________  (1) Order result status: Final Collection or observation date-time: 08/22/2009 09:59 Requested date-time:  Receipt date-time:  Reported date-time:  Referring Physician:   Ordering Physician: [name] [phone]) Specimen Source:  Source: Launa Grill Order Number: [id number] Lab site:

## 2010-05-19 NOTE — Progress Notes (Signed)
Summary: Mouth Sores  Phone Note Call from Patient Call back at Home Phone [phone]   Caller: Patient Summary of Call: Pt called stating that she was seen by [facility]J for sores around her mouth. Pt's Rx was increase but she says the sores are getting worse. Please advise, should pt follow up with PCP SAE? Initial call taken by: Margaret Pyle, CMA,  December 11, 2009 9:54 AM  Follow-up for Phone Call        yes, see what SAE says Follow-up by: Etta Grandchild MD,  December 11, 2009 10:01 AM  Additional Follow-up for Phone Call Additional follow up Details #1::        ov today Additional Follow-up by: Minus Breeding MD,  December 11, 2009 11:28 AM    Additional Follow-up for Phone Call Additional follow up Details #2::    pt's spouse informed Follow-up by: Margaret Pyle, CMA,  December 11, 2009 11:51 AM

## 2010-05-19 NOTE — Progress Notes (Signed)
Summary: Schedule recall colon   Phone Note Outgoing Call Call back at Home Phone [phone]   Call placed by: Christie Nottingham CMA Duncan Dull),  May 13, 2009 3:27 PM Call placed to: Patient Summary of Call: Called pt to schedule recall colon. Female answered the phone and states pt cannot take calls right now so I left a message with him for the pt to call back. Initial call taken by: Christie Nottingham CMA Duncan Dull),  May 13, 2009 3:29 PM  Follow-up for Phone Call        Pt states she is going on a cruise in March and only wants to schedule her procedure on Mondays in April. I told her that we do not have a schedule out for April. Pt's states she will call back to schedule next month.  Follow-up by: Christie Nottingham CMA (AAMA),  May 23, 2009 11:02 AM

## 2010-05-19 NOTE — Assessment & Plan Note (Signed)
Summary: welcome to medicare/blue medicare/#/lb   Vital Signs:  Patient profile:   64 year old female Height:      68 inches (172.72 cm) Weight:      178 pounds (80.91 kg) BMI:     27.16 O2 Sat:      95 % on Room air Temp:     98.7 degrees F (37.06 degrees C) oral Pulse rate:   88 / minute BP sitting:   124 / 72  (left arm) Cuff size:   regular  Vitals Entered By: Brenton Grills CMA Duncan Dull) (February 23, 2010 11:04 AM)  O2 Flow:  Room air CC: Medicare Wellness/pt is no longer taking Multivitamin, Claritin D, Vitamin C, or Prednisone/aj Is Patient Diabetic? No   Primary Provider:  ellison  CC:  Medicare Wellness/pt is no longer taking Multivitamin, Claritin D, Vitamin C, and or Prednisone/aj.  History of Present Illness: pt is here for medicare welllness visit.  she denies memory loss and depression.  she says she is able to perform activities of daily living without assistance.  she has no limitations to physical activity, or fall risk.  depression is well-controlled--none now.  Current Medications (verified): 1)  Multivitamins   Tabs (Multiple Vitamin) .... Take 1 By Mouth Qd 2)  Ditropan Xl 10 Mg  Tb24 (Oxybutynin Chloride) .... Qd 3)  Prilosec Otc 20 Mg Tbec (Omeprazole Magnesium) .Marland Kitchen.. 1 By Mouth Once Daily 4)  Valtrex 500 Mg Tabs (Valacyclovir Hcl) .Marland Kitchen.. 1 Qd 5)  Prednisone (Pak) 10 Mg Tabs (Prednisone) .... 5-Day Pack, As Dir 6)  Vitamin D 400 Unit Caps (Cholecalciferol) .... Take 1 By Mouth Once Daily 7)  Amlodipine Besylate 2.5 Mg Tabs (Amlodipine Besylate) .Marland Kitchen.. 1 Tab Once Daily 8)  Claritin-D 24 Hour 10-240 Mg Xr24h-Tab (Loratadine-Pseudoephedrine) .... Take 1 By Mouth Once Daily As Needed Allergies 9)  Aspirin 81 Mg Tbec (Aspirin) .... Take 1 By Mouth Once Daily 10)  Vitamin C 1200mg  .... Take 1 By Mouth Once Daily 11)  Calcium 600 Mg Tabs (Calcium) .Marland Kitchen.. 1 By Mouth Once Daily  Allergies (verified): 1)  ! Doxycycline 2)  ! Cozaar (Losartan Potassium) 3)  ! 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.1) GERD (ICD-530.81) ROUTINE GENERAL MEDICAL EXAM@HEALTH  CARE FACL (ICD-V70.0) COLONIC POLYPS, HX OF (ICD-V12.72) DEPRESSION (ICD-311) URINARY INCONTINENCE (ICD-788.30) OSTEOPOROSIS (ICD-733.00) HYPOTHYROIDISM (ICD-244.9) HYPERTENSION (ICD-401.9)  Past Surgical History: Cholecystectomy Bilateral Knee Arthroscopy- torn miniscus EGD (11/17/2006) Stress Cardiolite (02/14/2006) EKG (02/07/2006) removal 5-lb benign tumor from abdomen 2003 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 retired - Engineering geologist and travel married, 1 dog Drug use-no Regular exercise-yes Patient states former smoker. Quit 05-22-2001 alcohol is 1-2/week no illegal drugs  Physical Exam  Eyes:  (pt is up-to-date with opthal--dr hecker) Ears:  grossly normal hearing.   Additional Exam:  SEPARATE EVALUATION FOLLOWS--EACH PROBLEM HERE IS NEW, NOT RESPONDING TO TREATMENT, OR POSES SIGNIFICANT RISK TO THE PATIENT'S HEALTH: HISTORY OF THE PRESENT ILLNESS: pt says oxybutinin 10 mg does not help incontinence she did not tolerate zocor, due to myalgias. PAST MEDICAL HISTORY reviewed and up to date today REVIEW OF SYSTEMS: denies weight change PHYSICAL EXAMINATION: see  vs page does not appear anxious nor depressed ext: no edema LAB/XRAY RESULTS: Cholesterol LDL   92.9 mg/dL IMPRESSION: incontinence, needs increased rx dyslipidemia, therapy is limited by perceived drug intolerance PLAN: see instruction sheet   Impression &  Recommendations:  Problem # 1:  ROUTINE GENERAL MEDICAL EXAM@HEALTH  CARE FACL (ICD-V70.0)  Medications Added to Medication List This Visit: 1)  Calcium 600 Mg Tabs (Calcium) .Marland Kitchen.. 1 by mouth once daily 2)  Oxybutynin Chloride 15 Mg Xr24h-tab (Oxybutynin chloride) .Marland Kitchen.. 1 once daily  Other Orders: Flu Vaccine 64yrs + MEDICARE PATIENTS (W4132) Administration Flu vaccine - MCR (G0008) Est. Patient Level III (44010) Welcome to Medicare, Physical (U7253)  Patient Instructions: 1)  good diet and exercise habits are good for your health.  please let me know if you wish to be referred to a dietician. you should see an eye doctor every year. 2)  please consider these measures for your health:  minimize alcohol.  do not use tobacco products.  have a colonoscopy at least every 10 years from age 81.  keep firearms safely stored.  always use seat belts.  have working smoke alarms in your home.  see an eye doctor and dentist regularly.  never drive under the influence of alcohol or drugs (including prescription drugs).  those with fair skin should take precautions against the sun. 3)  please let me know what your wishes would be, if artificial life support measures should become necessary.  it is critically important to prevent falling down (keep floor areas well-lit, dry, and free of loose objects). 4)  here is a list of medicare-covered preventive serrvices 5)  increase oxybutinin to 15 mg once daily 6)  (update: i left message on phone-tree: i advised weight-loss) Prescriptions: OXYBUTYNIN CHLORIDE 15 MG XR24H-TAB (OXYBUTYNIN CHLORIDE) 1 once daily  #90 x 3   Entered and Authorized by:   Minus Breeding MD   Signed by:   Minus Breeding MD on 02/23/2010   Method used:   Electronically to        [facility] [phone]* (retail)       [address]       [place], [place]  [address]       Ph: [phone] or [phone]       Fax: [phone]   RxID:   [id number] VALTREX 500  MG TABS (VALACYCLOVIR HCL) 1 qd  #90 Tablet x 3   Entered and Authorized by:   Minus Breeding MD   Signed by:   Minus Breeding MD on 02/23/2010   Method used:   Electronically to        [facility] [phone]* (retail)       [address]       [place], [place]  [address]       Ph: [phone] or [phone]       Fax: [phone]   RxID:   [id number] OXYBUTYNIN CHLORIDE 15 MG XR24H-TAB (OXYBUTYNIN CHLORIDE) 1 once daily  #30 x 11   Entered and Authorized by:   Minus Breeding MD   Signed by:   Minus Breeding MD on 02/23/2010   Method used:   Electronically to        [facility] [phone]* (retail)       [address]       [facility],  [place]  [address]       Ph: [phone] or [phone]       Fax: [phone]   RxID:   [id number]    Orders Added: 1)  Flu Vaccine 51yrs + MEDICARE PATIENTS [Q2039] 2)  Administration Flu vaccine - MCR [G0008] 3)  Est. Patient Level III [51761] 4)  Welcome to Medicare, Physical [G0402]     Preventive Care Screening  Mammogram:    Date:  08/07/2009    Results:  normal  Flu Vaccine Consent Questions     Do you have a history of severe allergic reactions to this vaccine? no    Any prior history of allergic reactions to egg and/or gelatin? no    Do you have a sensitivity to the preservative Thimersol? no    Do you have a past history of Guillan-Barre Syndrome? no    Do you currently have an acute febrile illness? no    Have you ever had a severe reaction to latex? no    Vaccine information given and explained to patient? yes    Are you currently pregnant? no    Lot Number:[id number]   Exp Date:10/17/2010   Site Given  Left Deltoid IM normal           .[email or web address]

## 2010-05-19 NOTE — Assessment & Plan Note (Signed)
Summary: per md/LMB   Vital Signs:  Patient profile:   65 year old female Height:      68 inches (172.72 cm) Weight:      177.50 pounds (80.68 kg) BMI:     27.09 O2 Sat:      94 % on Room air Temp:     98.3 degrees F (36.83 degrees C) oral Pulse rate:   88 / minute BP sitting:   132 / 82  (left arm) Cuff size:   regular  Vitals Entered By: Brenton Grills MA (January 02, 2010 10:56 AM)  O2 Flow:  Room air CC: rash/dizziness from HCTZ?/pt states she has finished course of Prednisone, pt is not taking Zocor/aj   Primary Provider:  ellison  CC:  rash/dizziness from HCTZ?/pt states she has finished course of Prednisone and pt is not taking Zocor/aj.  History of Present Illness: pt started hctz as rx'ed.  since then she has recurrence of 5 days of slight swelling of upper and lower lips, but no assoc itching.  she also has slight lightheadedness, especially when bending over.   she stopped zocor due to myalgias.  Current Medications (verified): 1)  Multivitamins   Tabs (Multiple Vitamin) .... Take 1 By Mouth Qd 2)  Zocor 40 Mg  Tabs (Simvastatin) .... Take 1 By Mouth Qd 3)  Ditropan Xl 10 Mg  Tb24 (Oxybutynin Chloride) .... Qd 4)  Prilosec Otc 20 Mg Tbec (Omeprazole Magnesium) .Marland Kitchen.. 1 By Mouth Once Daily 5)  Valtrex 500 Mg Tabs (Valacyclovir Hcl) .Marland Kitchen.. 1 Qd 6)  Losartan Potassium 50 Mg Tabs (Losartan Potassium) .Marland Kitchen.. 1 Qd 7)  Prednisone (Pak) 10 Mg Tabs (Prednisone) .... 5-Day Pack, As Dir 8)  Hydrochlorothiazide 12.5 Mg Caps (Hydrochlorothiazide) .Marland Kitchen.. 1 Tab Once Daily 9)  Calcium Carbonate 600 Mg Tabs (Calcium Carbonate) .... Take 1 Tablet Two Times A Day 10)  Vitamin D 400 Unit Caps (Cholecalciferol) .... Take 1 By Mouth Once Daily  Allergies (verified): 1)  ! Doxycycline 2)  ! Cozaar (Losartan Potassium) 3)  ! * Shrimp  Past History:  Past Medical History: Last updated: 03/03/2007 Hypertension Hypothyroidism Osteoporosis Urinary incontinence Significant for a benign  tumor that weighed approximately 5 pounds and was resected. Hx of nasal septal reconstruction in the 1980's for deviated septum. Depression Dyslipidemia Colonic polyps, hx of quit smoking 2003 GERD with hiatal hernia  Review of Systems  The patient denies fever and dyspnea on exertion.    Physical Exam  General:  normal appearance.   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 recurrent  Problem # 3:  dizziness uncertain etiology  Medications Added to Medication List This Visit: 1)  Amlodipine Besylate 2.5 Mg Tabs (Amlodipine besylate) .Marland Kitchen.. 1 tab once daily  Other Orders: Est. Patient Level IV (25427)  Patient Instructions: 1)  please repeat the 5-day pred-pack. 2)  please keep appointment with allergist.  3)  stop hydrochlorothiazide. 4)  in 5 days, start amlodipine 2.5 mg once daily. 5)  we'll recheck cholesterol with your physical in november. Prescriptions: AMLODIPINE BESYLATE 2.5 MG TABS (AMLODIPINE BESYLATE) 1 tab once daily  #30 x 11   Entered and Authorized by:   Minus Breeding MD  Signed by:   Minus Breeding MD on 01/02/2010   Method used:   Electronically to        [facility] [phone]* (retail)       [address]       [place], [place]  [address]       Ph: [phone] or [phone]       Fax: [phone]   RxID:   [id number] PREDNISONE (PAK) 10 MG TABS (PREDNISONE) 5-day pack, as dir  #1 pack x 0   Entered and Authorized by:   Minus Breeding MD   Signed by:   Minus Breeding MD on 01/02/2010   Method used:   Electronically to        [facility] [phone]* (retail)       [address]       [place], [place]  [address]       Ph: [phone] or [phone]       Fax: [phone]   RxID:   [id number]

## 2010-05-19 NOTE — Assessment & Plan Note (Signed)
Summary: MOUTH IS NOT BETTER/ PER TRIAGE/NWS   Vital Signs:  Patient profile:   65 year old female Height:      68 inches (172.72 cm) Weight:      170 pounds (77.27 kg) BMI:     25.94 O2 Sat:      95 % on Room air Temp:     97.1 degrees F (36.17 degrees C) oral Pulse rate:   77 / minute Pulse rhythm:   regular BP sitting:   132 / 80  (left arm) Cuff size:   regular  Vitals Entered By: Brenton Grills MA (December 11, 2009 2:57 PM)  O2 Flow:  Room air CC: F/U on rash around mouth/pt is no longer taking Florouracil, Futicasone, or Silenor/aj   Primary Provider:  ellison  CC:  F/U on rash around mouth/pt is no longer taking Florouracil, Futicasone, and or Silenor/aj.  History of Present Illness: 10 days duration of sxs total.  since last week, pt states lip swelling is slightly worse.  the lower lip is slightly more swollen than the upper.  she is unable to cite precip factor.  no associated cough.  no ulcer inside the mouth.   she wants to pursue reclast.  Current Medications (verified): 1)  Multivitamins   Tabs (Multiple Vitamin) .... Take 1 By Mouth Qd 2)  Zocor 40 Mg  Tabs (Simvastatin) .... Take 1 By Mouth Qd 3)  Ditropan Xl 10 Mg  Tb24 (Oxybutynin Chloride) .... Qd 4)  Prilosec Otc 20 Mg Tbec (Omeprazole Magnesium) .Marland Kitchen.. 1 By Mouth Once Daily 5)  Valtrex 500 Mg Tabs (Valacyclovir Hcl) .Marland Kitchen.. 1 Qd 6)  Losartan Potassium 50 Mg Tabs (Losartan Potassium) .Marland Kitchen.. 1 Qd 7)  40 Fluorouracil 5% .... Apply To Legs Twice Daily For 4 Weeks 8)  60 Fluticasone Propionate 0.05 .... Apply To Legs Twice Daily As Needed 9)  Silenor 6 Mg Tabs (Doxepin Hcl) .... One By Mouth At Bedtime For Allergy 10)  Valtrex 500 Mg Tabs (Valacyclovir Hcl) .Marland Kitchen.. 1 Three Times A Day X 10 Days  Allergies (verified): 1)  ! Doxycycline 2)  ! * Shrimp  Past History:  Past Medical History: Last updated: 03/03/2007 Hypertension Hypothyroidism Osteoporosis Urinary incontinence Significant for a benign tumor that  weighed approximately 5 pounds and was resected. Hx of nasal septal reconstruction in the 1980's for deviated septum. Depression Dyslipidemia Colonic polyps, hx of quit smoking 2003 GERD with hiatal hernia  Review of Systems  The patient denies fever and dyspnea on exertion.    Physical Exam  General:  normal appearance.   Mouth:  lips are slightly swollen.  no open ulcer.   Neck:  Supple without thyroid enlargement or tenderness. No cervical lymphadenopathy, neck masses or tracheal deviation.    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 5-day pack, as dir 2)  Hydrochlorothiazide 12.5 Mg Caps (Hydrochlorothiazide) .Marland Kitchen.. 1 tab once daily  Other Orders: TLB-BMP (Basic Metabolic Panel-BMET) (80048-METABOL) Est. Patient Level IV ([address])  Patient Instructions: 1)  stop losartan on a trial basis. 2)  drop valacyclovir back to 500 mg once daily. 3)  take a 5-day pred-pack. 4)  refer allergist.  you will be called with a day and time for an appointment.  5)  call 4 days if no improvement. 6)  take hydrochlorothiazide 12.5 mg once daily. 7)  come in for you physical in a few months, as scheduled.   8)  blood tests are being ordered for you today.  please call [phone] to hear your test results, and i'll set you up for reclast. Prescriptions: HYDROCHLOROTHIAZIDE 12.5 MG CAPS (HYDROCHLOROTHIAZIDE) 1 tab once daily  #30 x 11   Entered and Authorized by:   Minus Breeding MD   Signed by:   Minus Breeding MD on 12/11/2009   Method used:   Electronically to        [facility] [phone]* (retail)       [address]       [place], [place]  [address]       Ph: [phone] or  [phone]       Fax: [phone]   RxID:   [id number] PREDNISONE (PAK) 10 MG TABS (PREDNISONE) 5-day pack, as dir  #1 pack x 0   Entered and Authorized by:   Minus Breeding MD   Signed by:   Minus Breeding MD on 12/11/2009   Method used:   Electronically to        [facility] [phone]* (retail)       [address]       [place], [place]  [address]       Ph: [phone] or [phone]       Fax: [phone]   RxID:   [id number]

## 2010-05-19 NOTE — Op Note (Signed)
Summary: Infusion Order  Infusion Order   Imported By: Lester Haleburg 12/29/2009 09:55:42  _____________________________________________________________________  External Attachment:    Type:   Image     Comment:   External Document

## 2010-05-19 NOTE — Letter (Signed)
Summary: Osmoprep Instructions  [facility]  [address] [place], [place] [address]   Phone: [phone]  Fax: [phone]       Jean Bowen    12-May-1965    MRN: [id number]        Procedure Day Dorna Bloom:  [name]  08/22/09     Arrival Time:  8:30am     Procedure Time:  9:30am     Location of Procedure:                    [name]  [facility] (4th Floor)     PREPARATION FOR COLONOSCOPY WITH OSMOPREP  Starting 5 days prior to your procedure  Sunday 05/01 o not eat nuts, seeds, popcorn, corn, beans, peas,  salads, or any raw vegetables.  Do not take any fiber supplements (e.g. Metamucil, Citrucel, and Benefiber). _________________________________________________________________________________________________  THE DAY BEFORE YOUR PROCEDURE             DATE 05/05 :    DAY: THURSDAY  1.   Drink clear liquids the entire day - NO SOLID FOOD.  Drink at least 64 oz. of fluid during the day to prevent hydration and help the prep work efficiently.    2.   Do not drink anything colored red or purple.  Avoid juices with pulp.  No orange juice.              CLEAR LIQUIDS INCLUDE: Water Jello Ice Popsicles Tea (sugar ok, no milk/cream) Powdered fruit flavored drinks Coffee (sugar ok, no milk/cream) Gatorade Juice: apple, white grape, white cranberry  Lemonade Clear bullion, consomm, broth Carbonated beverages (any kind) Strained chicken noodle soup Hard Candy   3.   Beginning at 5:00 p.m. or 6:00 p.m. the night before your procedure, drink one dose (4 tablets with 8 oz. of any clear liquid) every 15 minutes for a total of 5 doses (20 tablets total).  ___________________________________________________________________________________________________   THE DAY OF YOUR PROCEDURE            DATE: 05/06  DAY: FRIDAY  1.   Beginning at 4:30am  (5 hours before procedure), drink one dose (4 tablets with 8 oz. of any clear liquid) every 15 minutes for a total of  3 doses (12 tablets).  2.   You may drink clear liquids until 7:30am(2 hours before exam).  Do not drink anything after this time.       MEDICATION INSTRUCTIONS  Unless otherwise instructed, you should take regular prescription medications with a small sip of water as early as possible the morning of your procedure.  Diabetic patients - see separate instructions.  Stop taking Plavix or Aggrenox on  _  _  (7 days before procedure).     Stop taking Coumadin on  _  _  (5 days before procedure).  Additional medication instructions:_          OTHER INSTRUCTIONS  You will need a responsible adult at least 66 years of age to accompany you and drive you home.   This person must remain in the waiting room during your procedure.  Wear loose fitting clothing that is easily removed.  Leave jewelry and other valuables at home.  However, you may wish to bring a book to read or an iPod/MP3 player to listen to music as you wait for your procedure to start.  Remove all body piercing jewelry and leave at home.  Total time from sign-in until discharge is approximately  2-3 hours.  You should go home directly after your procedure and rest.  You can resume normal activities the day after your procedure.  The day of your procedure you should not:   Drive   Make legal decisions   Operate machinery   Drink alcohol   Return to work  You will receive specific instructions about eating, activities and medications before you leave.   The above instructions have been reviewed and explained to me by   _______________________   I fully understand and can verbalize these instructions _____________________________ Date _________

## 2010-09-01 NOTE — Op Note (Signed)
Jean Bowen, Jean Bowen                  ACCOUNT NO.:  [id number]   MEDICAL RECORD NO.:  [id number]          PATIENT TYPE:  AMB   LOCATION:  [facility]                         FACILITY:  [facility]   PHYSICIAN:  Ollen Gross, M.D.    DATE OF BIRTH:  1945-10-29   DATE OF PROCEDURE:  11/30/2006  DATE OF DISCHARGE:                               OPERATIVE REPORT   PREOPERATIVE DIAGNOSIS:  Left knee medial meniscal tear.   POSTOPERATIVE DIAGNOSIS:  Left knee medial meniscal tear.   PROCEDURE:  Left knee arthroscopy with meniscal debridement.   SURGEON:  Ollen Gross, M.D.  No assistant.   ANESTHESIA:  General.   ESTIMATED BLOOD LOSS:  Minimal,   DRAINS:  None.   COMPLICATIONS:  None.   CONDITION:  Stable to recovery.   BRIEF CLINICAL NOTE:  [name] is a 65 year old female with  significant history of left knee pain and mechanical symptoms.  Exam and  history is consistent with medial meniscal tear confirmed by MRI.  She  presents now for arthroscopy and debridement.   PROCEDURE IN DETAIL:  After successful administration of general  anesthetic, a tourniquet placed on the left thigh, left lower extremity  prepped and draped in the usual sterile fashion.  Standard superomedial  inferolateral incisions made, inflow cannula passed superomedial, camera  passed inferolateral.  Arthroscopic visualization proceeds.  Undersurface of the patella and trochlea looked normal.  The medial and  lateral gutters visualized and there were no loose bodies.  Flexion  valgus force applied to the knee and medial compartment was entered.  There is evidence of a degenerative tear in the body and posterior horn  of the medial meniscus.  The chondral surfaces on the femoral condyle  and tibial plateau looked normal.  Spinal needle was used to localize  the inferomedial portal.  Small incision made, dilator placed and then I  debrided the meniscus back to a stable base with baskets and a 4.2 mm  shaver.  We sealed  off the meniscus with the ArthroCare device.  The  intercondylar notch was visualized, ACL looks normal.  Lateral  compartment was entered and it is normal.  The joint was again inspected  and no further tears, loose bodies or chondral defects.  The  arthroscopic equipment was then removed from the inferior portals which  were closed with interrupted 4-0 nylon.  20 mL of 0.25% Marcaine with  epi injected through the inflow cannula and that was removed and that  portal closed with nylon.  A bulky sterile dressing was applied and she  was then awakened and transferred to recovery in stable condition.      Ollen Gross, M.D.  Electronically Signed     FA/MEDQ  D:  11/30/2006  T:  12/01/2006  Job:  [id number]

## 2010-09-04 NOTE — Op Note (Signed)
[name], [name]                  ACCOUNT NO.:  [id number]   MEDICAL RECORD NO.:  [id number]          PATIENT TYPE:  OIB   LOCATION:  2899                         FACILITY:  [facility]   PHYSICIAN:  Harvie Junior, M.D.   DATE OF BIRTH:  1945-06-25   DATE OF PROCEDURE:  11/23/2004  DATE OF DISCHARGE:  11/23/2004                                 OPERATIVE REPORT   PREOPERATIVE DIAGNOSIS:  Medial meniscal tear.   POSTOPERATIVE DIAGNOSES:  1.  Medial meniscal tear.  2.  Chondromalacia of patella.   PRINCIPAL PROCEDURES:  1.  Partial posterior horn medial meniscectomy.  2.  Debridement of chondromalacia of patella.   SURGEON:  Harvie Junior, MD.   ASSISTANT:  Marshia Ly, PA.   ANESTHESIA:  General.   BRIEF HISTORY:  [name] is a 65 year old female with a long history of  having a medial side knee pain, catching and locking.  She was originally  evaluated by Dr. Scherrie Bateman and was found to have a medial meniscal tear by  MRI.  Because of continuing complaints of symptoms, she was sent over for  evaluation and ultimately scheduled for arthroscopic intervention.  She is  brought to the operating room for this procedure.   PROCEDURE:  The patient was brought to the operating room and after an  adequate level of anesthesia obtained with general anesthetic, the patient  was placed on the operating room where the right leg was then prepped and  draped in the usual sterile fashion.  Following this, a routine arthroscopic  examination of the knee reveals an obvious posterior horn medial meniscal  tear, which was debrided back to a smooth and stable rim.  Once this was  accomplished, attention turned to the medial femoral condyle, which looked  quite good and no evidence of significant chondromalacia.  The ACL was  normal, the lateral side without significant abnormalities.  Attention was  turned up to the patellofemoral joint where there were some grade 2 and 3  changes on the lateral  aspect, which were debrided by way of a  chondroplasty.  At this point, the knee was copiously irrigated with six  liters of normal saline irrigation and suctioned dry.  The arthroscopic  portals were closed with a bandage.  20 mL of one quarter-percent  Marcaine was instilled into the knee for postoperative anesthesia.  The  patient had a sterile compressive dressing applied and was taken to the  recovery room where she was noted to be in satisfactory condition.  Estimated blood loss for the procedure was none.      Harvie Junior, M.D.  Electronically Signed     JLG/MEDQ  D:  11/23/2004  T:  11/23/2004  Job:  [id number]

## 2010-09-04 NOTE — Op Note (Signed)
[facility]  Patient:    Jean Bowen [facility] Visit Number: [id number] MRN: [id number]          Service Type: SUR Location: 4W 0442 01 Attending Physician:  Katha Cabal Dictated by:   Thornton Park Daphine Deutscher, M.D. Proc. Date: 05/23/01 Admit Date:  05/23/2001   CC:         Corwin Levins, M.D. [facility]             Dora M. Juanda Chance, M.D. [facility]                           Operative Report  PREOPERATIVE DIAGNOSIS:  Spindle cell neoplasm of the stomach.  POSTOPERATIVE DIAGNOSIS:  An approximately 9-cm spherical spindle cell neoplasm taking its origin along the anterior lesser curvature of the stomach, status post excision of gastric spindle cell tumor.  SURGEON:  Thornton Park. Daphine Deutscher, M.D.  ASSISTANT:  Adolph Pollack, M.D.  ANESTHESIA:  General endotracheal.  DESCRIPTION OF PROCEDURE:  [name] is a 65 year old lady who has noted a palpable mass in the upper abdomen. This was noted on CT and was biopsied and found it to be a benign spindle cell neoplasm. This was approached with consideration for being a GIS tumor. She was taken to room #11 and given general anesthesia. The abdomen was prepped with Betadine and draped sterilely. An upper midline incision was made and the abdomen was entered through the linea alba without difficulty. The tumor was readily apparent and it required the size of the incision I had in order to deliver this. I took down the omentum, which was stuck to it superiorly, oversewed those attachments with 2-0 Vicryl suture ligatures. This mobilized this area and we could feel the base and I was then able to get beneath the base with an approximately 3-cm margin using the measuring device. Picture was taken with the endoscopic camera and placed in the chart. It was elevated. I got beneath it with the   TA 60 using the 4.8 mm staples and fired this along the gastric wall and excised it. The rim of stomach appeared to have a good clear  margin and this was walked over by our Dr. Marvel Plan, family practice working with       Dr. Abbey Chatters. This was taken to pathology for orientation. The suture line was oversewn with some figure-of-eight and simple sutures of 2-0 Vicryl for hemostasis. A nice closure was present. An NG tube was placed above it. It appeared there was certainly an adequate tube of stomach for emptying. There was no bleeding encountered. We palpated the liver and there was no evidence of metastatic disease and no palpable adenopathy anywhere else.  The area was irrigated with saline. We changed our gloves. I closed the abdomen with a running #1 PDS from above and below, burying the knots in the above and below position and tying in the midline where the knot was. The wound was irrigated again. The skin was approximated with some vertical mattress sutures of 3-0 nylon and also with staples. The patient tolerated the procedure well and was taken to recovery room in satisfactory condition. Dictated by:   Thornton Park Daphine Deutscher, M.D. Attending Physician:  Katha Cabal DD:  05/23/01 TD:  05/23/01 Job: [id number]

## 2010-09-04 NOTE — Discharge Summary (Signed)
[facility]  Patient:    Jean Bowen [facility] Visit Number: [id number] MRN: [id number]          Service Type: SUR Location: 4W 0442 01 Attending Physician:  Katha Cabal Dictated by:   Thornton Park Daphine Deutscher, M.D. Admit Date:  05/23/2001 Discharge Date: 05/27/2001   CC:         Hedwig Morton. Juanda Chance, M.D.  Corwin Levins, M.D.   Discharge Summary  ADMISSION DIAGNOSIS:  Gastric spindle cell tumor.  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  HOSPITAL COURSE:  Annleigh Knueppel is a 65 year old lady with a palpable mass in her upper abdomen.  She was taken to the operating room, and this mass was found to be taking its origin off the less curvature.  We excised this along with the margin.  The margins were good.  We did have an NG tube down for a couple of days, and went slowly with that.  However, she did well, and she was ready for discharge on 05/27/01.  She had stopped smoking at that point, and arrangements were made as an outpatient to try to get her on some Wellbutrin which was done through the office to try to further facilitate that.  She is given Tylox to take for pain, and went home on a full liquid diet.  FINAL DIAGNOSIS:  GIS tumor as mentioned above. Dictated by:   Thornton Park Daphine Deutscher, M.D. Attending Physician:  Katha Cabal DD:  06/06/01 TD:  06/06/01 Job: [id number]

## 2010-09-04 NOTE — Procedures (Signed)
[facility]  Patient:    Jean Bowen [facility] Visit Number: [id number] MRN: [id number]          Service Type: END Location: ENDO Attending Physician:  Mervin Hack Dictated by:   Hedwig Morton. Juanda Chance, M.D. [facility] Admit Date:  03/06/2001   CC:         Corwin Levins, M.D. [facility]   Procedure Report  PROCEDURE:  Upper endoscopy.  INDICATIONS:  This 65 year old, asymptomatic, white female presented to Corwin Levins, M.D., for general physical exam and was found to have fullness in the left upper quadrant.  A CT scan of the abdomen confirmed the presence of a mass in the left upper quadrant measuring 7-8 cm in diameter being posterior to the gastric wall.  The patient denies any abdominal pain, weight loss, and early satiety.  She is undergo upper endoscopy to rule out intragastric mass and obtain appropriate biopsies.  ENDOSCOPE:  Olympus single-channel video endoscope.  SEDATION:  Versed 9 mg IV and Demerol 100 mg IV.  FINDINGS:  The Olympus single-channel video endoscope was passed under direct vision through the posterior pharynx into the esophagus.  The patient was monitored by pulse oximeter.  Oxygen saturations were normal.  She was quite cooperative.  Proximal and distal esophageal mucosa were unremarkable with normal-appearing squamocolumnar junction.  There was no evidence of esophagitis or hiatal hernia.  Stomach:  The stomach was insufflated with air.  It showed a small amount of gastric juice pooled along the greater curvature of the stomach.  There was mild paucity of the gastric folds in the body of the stomach, but the rugae were not excessively large.  They were easily distensible when the stomach was insufflated with air.  The lesser curvature of the stomach was normal.  There was no extrinsic compression of the stomach.  Retroflexion of the endoscope revealed normal symmetrical gastric fundus and cardia without evidence of compression.  There  were no mucosal lesions.  The gastric antrum and pyloric outlet were normal.  Duodenum:  The duodenal bulb showed minimal thickening of the folds in the duodenal bulb, but it was borderline of questionable significance.  The descending duodenum was normal.  There was no displacement or compression of the duodenal bulb.  Biopsies were taken from the duodenal bulb and separate biopsies were taken randomly from gastric folds.  Video photographs were obtained.  The patient tolerated the procedure well.  IMPRESSION:  Normal-appearing upper endoscopy, esophagus, stomach, and duodenum with biopsies of the duodenal bulb and gastric body and antrum.  No evidence of corresponding to abnormalities on the CT scan.  PLAN:  The CT scan was reviewed with Dr. Clearance Coots from [facility] again. Again, the colon confirms the presence of a posterior gastric mass, probably extrinsic to the stomach in the left upper quadrant.  This is a solid mass. We will proceed with MRI and if this mass is not vascular, suggest accessory spleen.  This may have to be biopsied percutaneously. Dictated by:   Hedwig Morton. Juanda Chance, M.D. [facility] Attending Physician:  Mervin Hack DD:  03/06/01 TD:  03/06/01 Job: [id number]

## 2010-10-26 ENCOUNTER — Other Ambulatory Visit: Payer: Self-pay | Admitting: Endocrinology

## 2010-10-26 DIAGNOSIS — Z1231 Encounter for screening mammogram for malignant neoplasm of breast: Secondary | ICD-10-CM

## 2010-11-09 ENCOUNTER — Ambulatory Visit
Admission: RE | Admit: 2010-11-09 | Discharge: 2010-11-09 | Disposition: A | Discharge status: Home / Self Care | Payer: Medicare Other | Source: Ambulatory Visit | Attending: Endocrinology | Admitting: Endocrinology

## 2010-11-09 DIAGNOSIS — Z1231 Encounter for screening mammogram for malignant neoplasm of breast: Secondary | ICD-10-CM

## 2010-11-13 ENCOUNTER — Other Ambulatory Visit: Payer: Self-pay | Admitting: Endocrinology

## 2010-12-26 ENCOUNTER — Other Ambulatory Visit: Payer: Self-pay | Admitting: Endocrinology

## 2010-12-29 ENCOUNTER — Telehealth: Payer: Self-pay | Admitting: *Deleted

## 2010-12-29 DIAGNOSIS — Z0000 Encounter for general adult medical examination without abnormal findings: Secondary | ICD-10-CM

## 2010-12-29 NOTE — Telephone Encounter (Signed)
Message copied by Carin Primrose on Tue Dec 29, 2010 11:44 AM ------      Message from: Newell Coral      Created: Mon Dec 28, 2010  1:19 PM      Regarding: sch physical - needs labs       This pt scheduled a physical and needs labs.  Let me know if there is anything else I can do!             Thanks so much!

## 2010-12-29 NOTE — Telephone Encounter (Signed)
Labs placed into Epic for upcoming CPX appointment.  

## 2011-02-01 LAB — POCT I-STAT 4, (NA,K, GLUC, HGB,HCT)
Glucose, Bld: 98
Potassium: 4.7
Sodium: 138

## 2011-02-15 ENCOUNTER — Other Ambulatory Visit: Payer: Self-pay | Admitting: Dermatology

## 2011-03-02 ENCOUNTER — Ambulatory Visit (INDEPENDENT_AMBULATORY_CARE_PROVIDER_SITE_OTHER): Payer: Medicare Other | Admitting: Endocrinology

## 2011-03-02 ENCOUNTER — Encounter: Payer: Self-pay | Admitting: Endocrinology

## 2011-03-02 ENCOUNTER — Other Ambulatory Visit (INDEPENDENT_AMBULATORY_CARE_PROVIDER_SITE_OTHER): Payer: Medicare Other

## 2011-03-02 VITALS — BP 122/78 | HR 81 | Temp 97.8°F | Ht 68.0 in | Wt 181.0 lb

## 2011-03-02 DIAGNOSIS — Z0000 Encounter for general adult medical examination without abnormal findings: Secondary | ICD-10-CM

## 2011-03-02 DIAGNOSIS — R1310 Dysphagia, unspecified: Secondary | ICD-10-CM | POA: Insufficient documentation

## 2011-03-02 DIAGNOSIS — Z136 Encounter for screening for cardiovascular disorders: Secondary | ICD-10-CM

## 2011-03-02 DIAGNOSIS — I10 Essential (primary) hypertension: Secondary | ICD-10-CM

## 2011-03-02 LAB — CBC WITH DIFFERENTIAL/PLATELET
Basophils Absolute: 0 10*3/uL (ref 0.0–0.1)
Basophils Relative: 0.6 % (ref 0.0–3.0)
HCT: 42.7 % (ref 36.0–46.0)
Hemoglobin: 14.4 g/dL (ref 12.0–15.0)
Lymphocytes Relative: 37.4 % (ref 12.0–46.0)
Lymphs Abs: 1.8 10*3/uL (ref 0.7–4.0)
MCHC: 33.8 g/dL (ref 30.0–36.0)
Monocytes Relative: 13.6 % — ABNORMAL HIGH (ref 3.0–12.0)
Neutro Abs: 2.1 10*3/uL (ref 1.4–7.7)
RBC: 4.57 Mil/uL (ref 3.87–5.11)
RDW: 14.1 % (ref 11.5–14.6)

## 2011-03-02 LAB — URINALYSIS, ROUTINE W REFLEX MICROSCOPIC
Nitrite: NEGATIVE
Specific Gravity, Urine: 1.02 (ref 1.000–1.030)
Total Protein, Urine: NEGATIVE
Urine Glucose: NEGATIVE
pH: 6 (ref 5.0–8.0)

## 2011-03-02 LAB — LDL CHOLESTEROL, DIRECT: Direct LDL: 95.8 mg/dL

## 2011-03-02 LAB — HEPATIC FUNCTION PANEL
ALT: 28 U/L (ref 0–35)
Bilirubin, Direct: 0 mg/dL (ref 0.0–0.3)
Total Bilirubin: 0.7 mg/dL (ref 0.3–1.2)

## 2011-03-02 LAB — BASIC METABOLIC PANEL
CO2: 31 mEq/L (ref 19–32)
Calcium: 9.2 mg/dL (ref 8.4–10.5)
GFR: 100.49 mL/min (ref >60.00)
Glucose, Bld: 87 mg/dL (ref 70–99)
Potassium: 4.5 mEq/L (ref 3.5–5.1)
Sodium: 142 mEq/L (ref 135–145)

## 2011-03-02 LAB — LIPID PANEL
HDL: 56.9 mg/dL (ref >39.00)
VLDL: 54.6 mg/dL — ABNORMAL HIGH (ref 0.0–40.0)

## 2011-03-02 MED ORDER — OXYBUTYNIN CHLORIDE 5 MG PO TABS: 5.0000 mg | ORAL_TABLET | Freq: Three times a day (TID) | ORAL | Status: DC

## 2011-03-02 MED ORDER — ACYCLOVIR 400 MG PO TABS: 800.0000 mg | ORAL_TABLET | ORAL | Status: AC

## 2011-03-02 MED ORDER — AMLODIPINE BESYLATE 2.5 MG PO TABS: 2.5000 mg | ORAL_TABLET | Freq: Every day | ORAL | Status: DC

## 2011-03-02 NOTE — Patient Instructions (Addendum)
please consider these measures for your health:  minimize alcohol.  do not use tobacco products.  have a colonoscopy at least every 10 years from age 66.  Women should have an annual mammogram from age 51.  keep firearms safely stored.  always use seat belts.  have working smoke alarms in your home.  see an eye doctor and dentist regularly.  never drive under the influence of alcohol or drugs (including prescription drugs).  those with fair skin should take precautions against the sun. please let me know what your wishes would be, if artificial life support measures should become necessary.  it is critically important to prevent falling down (keep floor areas well-lit, dry, and free of loose objects.  If you have a cane, walker, or wheelchair, you should use it, even for short trips around the house.  Also, try not to rush). blood tests are being requested for you today.  please call [phone] to hear your test results.  You will be prompted to enter the 9-digit "MRN" number that appears at the top left of this page, followed by #.  Then you will hear the message. Please return in 1 year. i'll request "reclast" for you.  you will receive a phone call, about a day and time for an appointment. Refer to a gastroenterology specialist.  you will receive a phone call, about a day and time for an appointment.   Change valacyclovir to acyclovir 2x400 mg daily.   Change oxybutinin to immediate-release 5 mg, 3x a day. Take calcium 500 mg 2x a day.

## 2011-03-02 NOTE — Progress Notes (Signed)
Subjective:    Patient ID: Jean Bowen, female    DOB: [birth year], 65 y.o.   MRN: [id number]  HPI Pt states 6 mos of intermittent slight food-only dysphagia sensation.  No assoc pain. She requests cheaper alternatives to zovirax and ditropan-xl Past Medical History  Diagnosis Date  . URINARY INCONTINENCE 12/03/2006  . OSTEOPOROSIS 12/03/2006  . HYPOTHYROIDISM 12/03/2006  . HYPERTENSION 12/03/2006  . HYPERCHOLESTEROLEMIA 02/23/2010  . GERD 03/03/2007  . DEPRESSION 12/03/2006  . COLONIC POLYPS, HX OF 12/03/2006  . ANGIOEDEMA 12/11/2009  . DERMATITIS, PERIORAL 01/08/2010  . History of benign gastric tumor     significant for a benign gastric tumor that weighed approximately 5 pounds and was resected.    Past Surgical History  Procedure Date  . Cholecystectomy   . Knee arthroscopy     Bilateral-torn meniscus  . Esophagogastroduodenoscopy 11/17/2006  . Stress cardiolite 02/14/2006  . Tumor removal 2003    removal 5 lb benign tumor from abdomen  . 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 Bunionectomy     both feet  . Electrocardiogram 02/07/2006  . Hx of nasal septal reconstruction for deviated septum 1980's    History   Social History  . Marital Status: Married    Spouse Name: N/A    Number of Children: N/A  . Years of Education: N/A   Occupational History  . 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 Smoking status: Former Smoker    Quit date: 05/22/2001  . Smokeless tobacco: Not on file  . Alcohol Use: Yes     1-2/week  . Drug Use: No  . Sexually Active: Not on file   Other Topics Concern  . Not on file   Social History Narrative   1 dogRegular exercise-yes    Current Outpatient Prescriptions on File Prior to Visit  Medication Sig Dispense Refill  . aspirin 81 MG tablet Take 81 mg by mouth daily.        Marland Kitchen loratadine-pseudoephedrine (CLARITIN-D 24-HOUR) 10-240 MG per 24 hr tablet Take 1 tablet by  mouth daily as needed. For allergies       . omeprazole (PRILOSEC OTC) 20 MG tablet Take 20 mg by mouth daily.        . calcium carbonate (OS-CAL) 600 MG TABS Take 600 mg by mouth daily.          Allergies  Allergen Reactions  . Doxycycline     REACTION: swollen lips and throat  . Losartan Potassium     REACTION: ? of angioedema    Family History  Problem Relation Age of Onset  . Melanoma Mother   . Cancer Father     Colon Cancer-at advanced age  . Cancer Brother     Cancer involving pancreas and liver    BP 122/78  Pulse 81  Temp(Src) 97.8 F (36.6 C) (Oral)  Ht 5\' 8"  (1.727 m)  Wt 181 lb (82.101 kg)  BMI 27.52 kg/m2  SpO2 94%     Review of Systems  Constitutional: Negative for fever and unexpected weight change.  Eyes: Negative for visual disturbance.  Respiratory: Negative for shortness of breath.   Cardiovascular: Negative for chest pain.  Gastrointestinal: Negative for anal bleeding.  Genitourinary: Negative for hematuria.  Psychiatric/Behavioral: Negative for dysphoric mood.       Objective:   Physical Exam VS: see vs page GEN: no distress HEAD: head: no deformity eyes:  no periorbital swelling, no proptosis external nose and ears are normal mouth: no lesion seen NECK: supple, thyroid is not enlarged CHEST WALL: no deformity LUNGS:  Clear to auscultation BREASTS:  No mass.  No d/c CV: reg rate and rhythm, no murmur ABD: abdomen is soft, nontender.  no hepatosplenomegaly.  not distended.  no hernia GENITALIA:  Normal external female.  Normal bimanual exam RECTAL: normal external and internal exam.  heme neg MUSCULOSKELETAL: muscle bulk and strength are grossly normal.  no obvious joint swelling.  gait is normal and steady EXTEMITIES: no deformity.  no ulcer on the feet.  feet are of normal color and temp.  Trace edema.  There is bilateral onychomycosis PULSES: dorsalis pedis intact bilat.  no carotid bruit NEURO:  cn 2-12 grossly intact.   readily  moves all 4's.  sensation is intact to touch on the feet SKIN:  Normal texture and temperature.  No rash or suspicious lesion is visible.   NODES:  None palpable at the neck PSYCH: alert, oriented x3.  Does not appear anxious nor depressed.   Lab Results  Component Value Date   WBC 4.8 03/02/2011   HGB 14.4 03/02/2011   HCT 42.7 03/02/2011   PLT 243.0 03/02/2011   GLUCOSE 87 03/02/2011   CHOL 231* 03/02/2011   TRIG 273.0* 03/02/2011   HDL 56.90 03/02/2011   LDLDIRECT 95.8 03/02/2011   LDLCALC 71 03/05/2009   ALT 28 03/02/2011   AST 21 03/02/2011   NA 142 03/02/2011   K 4.5 03/02/2011   CL 104 03/02/2011   CREATININE 0.6 03/02/2011   BUN 19 03/02/2011   CO2 31 03/02/2011   TSH 5.11 03/02/2011   HGBA1C 5.9 03/05/2009  i reviewed electrocardiogram    Assessment & Plan:  Dysphagia, new, uncertain etiology Dyslipidemia, well-controlled Hypothyroidism, with normal tsh now Pt requests cheaper meds     Subjective:   Patient here for Medicare annual wellness visit and management of other chronic and acute problems.     Risk factors: advanced age    Roster of Physicians Providing Medical Care to Patient: Opthal: hecker Derm: drew jones Ortho: alusio and supple.    Activities of Daily Living: In your present state of health, do you have any difficulty performing the following activities?:  Preparing food and eating?: No  Bathing yourself: No  Getting dressed: No  Using the toilet: No  Moving around from place to place: No  In the past year have you fallen or had a near fall?: No    Home Safety: Has smoke detector and wears seat belts. Firearms are safely stored. No excess sun exposure.  Diet and Exercise  Current exercise habits:  Pt says limited by arthralgias Dietary issues discussed: healthy diet   Depression Screen  Q1: Over the past two weeks, have you felt down, depressed or hopeless? no  Q2: Over the past two weeks, have you felt little interest or  pleasure in doing things? no   The following portions of the patient's history were reviewed and updated as appropriate: allergies, current medications, past family history, past medical history, past social history, past surgical history and problem list.  Past Medical History  Diagnosis Date  . URINARY INCONTINENCE 12/03/2006  . OSTEOPOROSIS 12/03/2006  . HYPOTHYROIDISM 12/03/2006  . HYPERTENSION 12/03/2006  . HYPERCHOLESTEROLEMIA 02/23/2010  . GERD 03/03/2007  . DEPRESSION 12/03/2006  . COLONIC POLYPS, HX OF 12/03/2006  . ANGIOEDEMA 12/11/2009  . DERMATITIS, PERIORAL 01/08/2010  . History of benign gastric  tumor     significant for a benign gastric tumor that weighed approximately 5 pounds and was resected.    Past Surgical History  Procedure Date  . Cholecystectomy   . Knee arthroscopy     Bilateral-torn meniscus  . Esophagogastroduodenoscopy 11/17/2006  . Stress cardiolite 02/14/2006  . Tumor removal 2003    removal 5 lb benign tumor from abdomen  . 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 Bunionectomy     both feet  . Electrocardiogram 02/07/2006  . Hx of nasal septal reconstruction for deviated septum 1980's    History   Social History  . Marital Status: Married    Spouse Name: N/A    Number of Children: N/A  . Years of Education: N/A   Occupational History  . 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 Smoking status: Former Smoker    Quit date: 05/22/2001  . Smokeless tobacco: Not on file  . Alcohol Use: Yes     1-2/week  . Drug Use: No  . Sexually Active: Not on file   Other Topics Concern  . Not on file   Social History Narrative   1 dogRegular exercise-yes    Current Outpatient Prescriptions on File Prior to Visit  Medication Sig Dispense Refill  . amLODipine (NORVASC) 2.5 MG tablet 1 TAB ONCE DAILY  30 tablet  2  . aspirin 81 MG tablet Take 81 mg by mouth daily.        Marland Kitchen  loratadine-pseudoephedrine (CLARITIN-D 24-HOUR) 10-240 MG per 24 hr tablet Take 1 tablet by mouth daily as needed. For allergies       . omeprazole (PRILOSEC OTC) 20 MG tablet Take 20 mg by mouth daily.        Marland Kitchen oxybutynin (DITROPAN XL) 15 MG 24 hr tablet Take 15 mg by mouth daily.        . valACYclovir (VALTREX) 500 MG tablet TAKE ONE TABLET BY MOUTH ONE TIME DAILY  30 tablet  3  . calcium carbonate (OS-CAL) 600 MG TABS Take 600 mg by mouth daily.          Allergies  Allergen Reactions  . Doxycycline     REACTION: swollen lips and throat  . Losartan Potassium     REACTION: ? of angioedema    Family History  Problem Relation Age of Onset  . Melanoma Mother   . Cancer Father     Colon Cancer-at advanced age  . Cancer Brother     Cancer involving pancreas and liver    BP 122/78  Pulse 81  Temp(Src) 97.8 F (36.6 C) (Oral)  Ht 5\' 8"  (1.727 m)  Wt 181 lb (82.101 kg)  BMI 27.52 kg/m2  SpO2 94%   Review of Systems  Denies hearing loss, and visual loss Objective:   Vision:  Sees opthalmologist Hearing: grossly normal Body mass index:  See vs page Msk: pt easily and quickly performs "get-up-and-go" from a sitting position. Cognitive Impairment Assessment: cognition, memory and judgment appear normal.  remembers 3/3 at 5 minutes.  excellent recall.  can easily read and write a sentence.  alert and oriented x 3   Assessment:   Medicare wellness utd on preventive parameters    Plan:   During the course of the visit the patient was educated and counseled about appropriate screening and preventive services including:        Fall prevention  Screening mammography  Bone densitometry screening  Diabetes screening  Nutrition counseling   Vaccines / LABS Zostavax / Pnemonccoal Vaccine  today  PSA  Patient Instructions (the written plan) was given to the patient.

## 2011-03-17 ENCOUNTER — Telehealth: Payer: Self-pay | Admitting: *Deleted

## 2011-03-17 MED ORDER — ZOLEDRONIC ACID 5 MG/100ML IV SOLN: 5.0000 mg | Freq: Once | INTRAVENOUS | Status: DC

## 2011-03-17 NOTE — Telephone Encounter (Signed)
Short Stay needs order for Reclast infusion placed into Epic for pt.

## 2011-03-17 NOTE — Telephone Encounter (Signed)
Pt informed of Reclast appointment date and time.

## 2011-03-17 NOTE — Telephone Encounter (Signed)
Written Order was faxed to them, but they also need orders placed into Epic now that they are now using Epic.

## 2011-03-17 NOTE — Telephone Encounter (Signed)
i did this form within the past 2 days

## 2011-03-17 NOTE — Telephone Encounter (Signed)
i entered into meds.  i there something else i need to enter?

## 2011-03-17 NOTE — Telephone Encounter (Signed)
No. Appointment scheduled for 04/01/2011 at 1:00pm. Left message for pt to callback office to inform pt of appointment.

## 2011-03-23 ENCOUNTER — Encounter: Payer: Self-pay | Admitting: *Deleted

## 2011-03-29 ENCOUNTER — Encounter: Payer: Self-pay | Admitting: Internal Medicine

## 2011-03-29 ENCOUNTER — Ambulatory Visit (INDEPENDENT_AMBULATORY_CARE_PROVIDER_SITE_OTHER): Payer: Medicare Other | Admitting: Internal Medicine

## 2011-03-29 VITALS — BP 138/84 | Ht 68.0 in | Wt 182.0 lb

## 2011-03-29 DIAGNOSIS — R1319 Other dysphagia: Secondary | ICD-10-CM

## 2011-03-29 NOTE — Progress Notes (Signed)
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 She is now complaining of a choking sensation in her throat in the mornings, feeling like dry mouth. Her last upper endoscopy in 2008 showed a small hiatal hernia. She has been on Prilosec 20 mg in the morning. She sleeps with her mouth open as per her husband's observations. She denies any dysphagia. Her last colonoscopy in May 2011 showed a hyperplastic polyp. There is a family history of colorectal cancer in her father.    Past Medical History  Diagnosis Date  . URINARY INCONTINENCE 12/03/2006  . OSTEOPOROSIS 12/03/2006  . HYPOTHYROIDISM 12/03/2006  . HYPERTENSION 12/03/2006  . HYPERCHOLESTEROLEMIA 02/23/2010  . GERD 03/03/2007  . DEPRESSION 12/03/2006  . Hyperplastic colon polyp 12/03/2006  . ANGIOEDEMA 08/22/09  . DERMATITIS, PERIORAL 01/08/2010  . History of benign gastric tumor     significant for a benign gastric tumor that weighed approximately 5 pounds and was resected.  . OSA (obstructive sleep apnea)   . Hiatal hernia   . Diverticulosis    Past Surgical History  Procedure Date  . Cholecystectomy   . Knee arthroscopy     Bilateral-torn meniscus  . Esophagogastroduodenoscopy 11/17/2006  . Stress cardiolite 02/14/2006  . Tumor removal 2003    removal 5 lb benign tumor from abdomen  . 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 Bunionectomy     both feet  . Electrocardiogram 02/07/2006  . Hx of nasal septal reconstruction for deviated septum 1980's    reports that she quit smoking about 9 years ago. She does not have any smokeless tobacco history on file. She reports that she drinks alcohol. She reports that she does not use illicit drugs. family history includes Arthritis in her father; Colon cancer in  her father; Diabetes in some unspecified family members; Heart attack in an unspecified family member; Liver cancer in her brother; Melanoma in her mother; and Pancreatic cancer in her brother. Allergies  Allergen Reactions  . Doxycycline     REACTION: swollen lips and throat  . Losartan Potassium     REACTION: ? of angioedema        Review of Systems: As needed for dysphagia. Negative for odynophagia or chest pain  The remainder of the 10 point ROS is negative except as outlined in H&P   Physical Exam: General appearance  Well developed, in no distress. Eyes- non icteric. HEENT nontraumatic, normocephalic. Mouth no lesions, tongue papillated, no cheilosis. Neck supple without adenopathy, thyroid not enlarged, no carotid bruits, no JVD. Lungs Clear to auscultation bilaterally. Cor normal S1, normal S2, regular rhythm, no murmur,  quiet precordium. Abdomen: Soft nontender with normoactive bowel sounds. Well-healed surgical scar. No mass. Rectal: Not done. Extremities no pedal edema. Skin no lesions. Neurological alert and oriented x 3. Psychological normal mood and affect.  Assessment and Plan:  Problem #1 Globus sensation in the mornings may indicate a dry mouth. She does have obstructive sleep apnea which may result in a dry mouth. We need to rule out an esophageal stricture. We also need to rule out esophageal dysmotility. We will proceed with a barium esophagram to assess the motility. I asked her to sleep on her side to favor her mouth being closed. She may  also take sips of water during the night. She will switch her Prilosec from a morning dose to bedtime dose.  Problem #2 Positive family history of colorectal cancer. A recall colonoscopy will be due in May 2016.   03/29/2011 Jean Bowen

## 2011-03-29 NOTE — Patient Instructions (Signed)
You have been scheduled for a Barium Esophogram at [facility] (1st floor of the hospital) on Friday 04/02/11 at 9:30 am. Please arrive 15 minutes prior to your appointment for registration. Make certain not to have anything to eat or drink 6 hours prior to your test. If you need to reschedule for any reason, please contact radiology at [phone] to do so. CC: Dr Romero Belling

## 2011-04-01 ENCOUNTER — Encounter (HOSPITAL_COMMUNITY)
Admission: RE | Admit: 2011-04-01 | Discharge: 2011-04-01 | Disposition: A | Discharge status: Home / Self Care | Payer: Medicare Other | Source: Ambulatory Visit | Attending: Endocrinology | Admitting: Endocrinology

## 2011-04-01 ENCOUNTER — Encounter (HOSPITAL_COMMUNITY): Payer: Self-pay

## 2011-04-01 DIAGNOSIS — M810 Age-related osteoporosis without current pathological fracture: Secondary | ICD-10-CM | POA: Insufficient documentation

## 2011-04-01 MED ORDER — SODIUM CHLORIDE 0.9 % IV SOLN
INTRAVENOUS | Status: AC
  Administered 2011-04-01: 14:00:00 via INTRAVENOUS

## 2011-04-01 MED ORDER — ZOLEDRONIC ACID 5 MG/100ML IV SOLN
5.0000 mg | Freq: Once | INTRAVENOUS | Status: AC
  Administered 2011-04-01: 5 mg via INTRAVENOUS
  Filled 2011-04-01: qty 100

## 2011-04-02 ENCOUNTER — Ambulatory Visit (HOSPITAL_COMMUNITY)
Admission: RE | Admit: 2011-04-02 | Discharge: 2011-04-02 | Disposition: A | Discharge status: Home / Self Care | Payer: Medicare Other | Source: Ambulatory Visit | Attending: Internal Medicine | Admitting: Internal Medicine

## 2011-04-02 DIAGNOSIS — R1319 Other dysphagia: Secondary | ICD-10-CM

## 2011-04-02 DIAGNOSIS — K224 Dyskinesia of esophagus: Secondary | ICD-10-CM | POA: Insufficient documentation

## 2011-04-02 DIAGNOSIS — R1310 Dysphagia, unspecified: Secondary | ICD-10-CM | POA: Insufficient documentation

## 2011-04-02 DIAGNOSIS — K449 Diaphragmatic hernia without obstruction or gangrene: Secondary | ICD-10-CM | POA: Insufficient documentation

## 2011-04-02 DIAGNOSIS — I700 Atherosclerosis of aorta: Secondary | ICD-10-CM | POA: Insufficient documentation

## 2011-04-02 DIAGNOSIS — M47812 Spondylosis without myelopathy or radiculopathy, cervical region: Secondary | ICD-10-CM | POA: Insufficient documentation

## 2011-04-05 ENCOUNTER — Telehealth: Payer: Self-pay | Admitting: *Deleted

## 2011-04-05 NOTE — Telephone Encounter (Signed)
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

## 2011-04-05 NOTE — Telephone Encounter (Signed)
Spoke with patient and gave her the results as per Dr. Juanda Chance and recommendation

## 2011-08-07 ENCOUNTER — Other Ambulatory Visit: Payer: Self-pay | Admitting: Family Medicine

## 2011-08-07 ENCOUNTER — Ambulatory Visit (INDEPENDENT_AMBULATORY_CARE_PROVIDER_SITE_OTHER): Payer: Medicare Other | Admitting: Family Medicine

## 2011-08-07 ENCOUNTER — Encounter: Payer: Self-pay | Admitting: Family Medicine

## 2011-08-07 VITALS — BP 140/88 | Temp 98.3°F | Wt 177.0 lb

## 2011-08-07 DIAGNOSIS — T148XXA Other injury of unspecified body region, initial encounter: Secondary | ICD-10-CM | POA: Diagnosis not present

## 2011-08-07 DIAGNOSIS — W57XXXA Bitten or stung by nonvenomous insect and other nonvenomous arthropods, initial encounter: Secondary | ICD-10-CM

## 2011-08-07 DIAGNOSIS — L988 Other specified disorders of the skin and subcutaneous tissue: Secondary | ICD-10-CM | POA: Diagnosis not present

## 2011-08-07 DIAGNOSIS — T148 Other injury of unspecified body region: Secondary | ICD-10-CM

## 2011-08-07 DIAGNOSIS — R238 Other skin changes: Secondary | ICD-10-CM

## 2011-08-07 MED ORDER — TRIAMCINOLONE ACETONIDE 0.5 % EX OINT: TOPICAL_OINTMENT | Freq: Two times a day (BID) | CUTANEOUS | Status: AC

## 2011-08-07 NOTE — Patient Instructions (Signed)
Wood Tick Bite Ticks are insects that attach themselves to the skin. Most tick bites are harmless, but sometimes ticks carry diseases that can make a person quite ill. The chance of getting ill depends on:  The kind of tick that bites you.   Time of year.   How long the tick is attached.   Geographic location.  Wood ticks are also called dog ticks. They are generally black. They can have white markings. They live in shrubs and grassy areas. They are larger than deer ticks. Wood ticks are about the size of a watermelon seed. They have a hard body. The most common places for ticks to attach themselves are the scalp, neck, armpits, waist, and groin. Wood ticks may stay attached for up to 2 weeks. TICKS MUST BE REMOVED AS SOON AS POSSIBLE TO HELP PREVENT DISEASES CAUSED BY TICK BITES.  TO REMOVE A TICK: 1. If available, put on latex gloves before trying to remove a tick.  2. Grasp the tick as close to the skin as possible, with curved forceps, fine tweezers or a special tick removal tool.  3. Pull gently with steady pressure until the tick lets go. Do not twist the tick or jerk it suddenly. This may break off the tick's head or mouth parts.  4. Do not crush the tick's body. This could force disease-carrying fluids from the tick into your body.  5. After the tick is removed, wash the bite area and your hands with soap and water or other disinfectant.  6. Apply a small amount of antiseptic cream or ointment to the bite site.  7. Wash and disinfect any instruments that were used.  8. Save the tick in a jar or plastic bag for later identification. Preserve the tick with a bit of alcohol or put it in the freezer.  9. Do not apply a hot match, petroleum jelly, or fingernail polish to the tick. This does not work and may increase the chances of disease from the tick bite.  YOU MAY NEED TO SEE YOUR CAREGIVER FOR A TETANUS SHOT NOW IF:  You have no idea when you had the last one.   You have never had a  tetanus shot before.  If you need a tetanus shot, and you decide not to get one, there is a rare chance of getting tetanus. Sickness from tetanus can be serious. If you get a tetanus shot, your arm may swell, get red and warm to the touch at the shot site. This is common and not a problem. PREVENTION  Wear protective clothing. Long sleeves and pants are best.   Wear white clothes to see ticks more easily   Tuck your pant legs into your socks.   If walking on trail, stay in the middle of the trail to avoid brushing against bushes.   Put insect repellent on all exposed skin and along boot tops, pant legs and sleeve cuffs   Check clothing, hair and skin repeatedly and before coming inside.   Brush off any ticks that are not attached.  SEEK MEDICAL CARE IF:   You cannot remove a tick or part of the tick that is left in the skin.   Unexplained fever.   Redness and swelling in the area of the tick bite.   Tender, swollen lymph glands.   Diarrhea.   Weight loss.   Cough.   Fatigue.   Muscle, joint or bone pain.   Belly pain.   Headache.   Rash.    SEEK IMMEDIATE MEDICAL CARE IF:   You develop an oral temperature above 102 F (38.9 C).   You are having trouble walking or moving your legs.   Numbness in the legs.   Shortness of breath.   Confusion.   Repeated vomiting.  Document Released: 04/02/2000 Document Revised: 03/25/2011 Document Reviewed: 03/11/2008 [employer] Patient Information 2012 [employer]. 

## 2011-08-07 NOTE — Progress Notes (Signed)
  Subjective:    Patient ID: Jean Bowen, female    DOB: 1946/02/10, 66 y.o.   MRN: [id number]  HPI Saw tick on her about 3 days ago but wasn't attached at the time but found it on the same leg as the rash started.  Noticed red itchy spot about 3 days ago on her right shin.  No fever or fatigue, malaise or muscle aches.  She has been using bandaids bc it is very itchey so she won't scratch it.     Review of Systems     Objective:   Physical Exam  Constitutional: She appears well-developed and well-nourished.  Skin: Skin is warm and dry.  Psychiatric: She has a normal mood and affect. Her behavior is normal.  on right shin has 2 cm erythematous macule that appear dry with a large clear vesicle in the center. N oacitive drianage or sign of cellulits.          Assessment & Plan:  Skin Lesion -Moslty likely localized inflammtory reaction from tick bite.  Wound culture obtained form the vesicle in the center to rule out secondary infection. Consider this could be viral if not really a tick bite. Consider is ringworm, if not getting better on the topical steroid cream. Can continue the antihistamine for icthing since it is helping.

## 2011-08-10 LAB — WOUND CULTURE: Organism ID, Bacteria: NO GROWTH

## 2011-09-07 ENCOUNTER — Encounter: Payer: Self-pay | Admitting: Internal Medicine

## 2011-09-09 DIAGNOSIS — H2510 Age-related nuclear cataract, unspecified eye: Secondary | ICD-10-CM | POA: Diagnosis not present

## 2011-09-09 DIAGNOSIS — H538 Other visual disturbances: Secondary | ICD-10-CM | POA: Diagnosis not present

## 2011-09-09 DIAGNOSIS — H43399 Other vitreous opacities, unspecified eye: Secondary | ICD-10-CM | POA: Diagnosis not present

## 2011-09-09 DIAGNOSIS — H43819 Vitreous degeneration, unspecified eye: Secondary | ICD-10-CM | POA: Diagnosis not present

## 2011-09-09 DIAGNOSIS — H524 Presbyopia: Secondary | ICD-10-CM | POA: Diagnosis not present

## 2011-10-05 DIAGNOSIS — H269 Unspecified cataract: Secondary | ICD-10-CM | POA: Diagnosis not present

## 2011-10-05 DIAGNOSIS — H2510 Age-related nuclear cataract, unspecified eye: Secondary | ICD-10-CM | POA: Diagnosis not present

## 2011-11-08 DIAGNOSIS — H2510 Age-related nuclear cataract, unspecified eye: Secondary | ICD-10-CM | POA: Diagnosis not present

## 2011-11-16 DIAGNOSIS — H2510 Age-related nuclear cataract, unspecified eye: Secondary | ICD-10-CM | POA: Diagnosis not present

## 2011-11-16 DIAGNOSIS — H269 Unspecified cataract: Secondary | ICD-10-CM | POA: Diagnosis not present

## 2011-11-22 ENCOUNTER — Other Ambulatory Visit: Payer: Self-pay | Admitting: Endocrinology

## 2011-11-22 DIAGNOSIS — Z1231 Encounter for screening mammogram for malignant neoplasm of breast: Secondary | ICD-10-CM

## 2011-12-09 ENCOUNTER — Ambulatory Visit
Admission: RE | Admit: 2011-12-09 | Discharge: 2011-12-09 | Disposition: A | Discharge status: Home / Self Care | Payer: Medicare Other | Source: Ambulatory Visit | Attending: Endocrinology | Admitting: Endocrinology

## 2011-12-09 DIAGNOSIS — Z1231 Encounter for screening mammogram for malignant neoplasm of breast: Secondary | ICD-10-CM

## 2012-01-03 DIAGNOSIS — Z23 Encounter for immunization: Secondary | ICD-10-CM | POA: Diagnosis not present

## 2012-01-06 ENCOUNTER — Telehealth: Payer: Self-pay | Admitting: *Deleted

## 2012-01-06 NOTE — Telephone Encounter (Signed)
Patient to come by on 01/10/12 for EGD instructions.

## 2012-01-12 ENCOUNTER — Ambulatory Visit (AMBULATORY_SURGERY_CENTER): Payer: Medicare Other | Admitting: Internal Medicine

## 2012-01-12 ENCOUNTER — Encounter: Payer: Self-pay | Admitting: Internal Medicine

## 2012-01-12 VITALS — BP 142/87 | HR 66 | Temp 98.7°F | Resp 24 | Ht 68.0 in | Wt 182.0 lb

## 2012-01-12 DIAGNOSIS — K2970 Gastritis, unspecified, without bleeding: Secondary | ICD-10-CM | POA: Diagnosis not present

## 2012-01-12 DIAGNOSIS — K219 Gastro-esophageal reflux disease without esophagitis: Secondary | ICD-10-CM | POA: Diagnosis not present

## 2012-01-12 DIAGNOSIS — I10 Essential (primary) hypertension: Secondary | ICD-10-CM | POA: Diagnosis not present

## 2012-01-12 DIAGNOSIS — R1310 Dysphagia, unspecified: Secondary | ICD-10-CM | POA: Diagnosis not present

## 2012-01-12 DIAGNOSIS — F329 Major depressive disorder, single episode, unspecified: Secondary | ICD-10-CM | POA: Diagnosis not present

## 2012-01-12 DIAGNOSIS — K2990 Gastroduodenitis, unspecified, without bleeding: Secondary | ICD-10-CM | POA: Diagnosis not present

## 2012-01-12 DIAGNOSIS — D131 Benign neoplasm of stomach: Secondary | ICD-10-CM | POA: Diagnosis not present

## 2012-01-12 DIAGNOSIS — E039 Hypothyroidism, unspecified: Secondary | ICD-10-CM | POA: Diagnosis not present

## 2012-01-12 MED ORDER — SODIUM CHLORIDE 0.9 % IV SOLN: 500.0000 mL | INTRAVENOUS | Status: DC

## 2012-01-12 MED ORDER — OMEPRAZOLE 40 MG PO CPDR: 40.0000 mg | DELAYED_RELEASE_CAPSULE | Freq: Every day | ORAL | Status: DC

## 2012-01-12 NOTE — Op Note (Signed)
[facility] [address] [place], [address]   ENDOSCOPY PROCEDURE REPORT  PATIENT: Jean, Bowen  MR#: #[id number] BIRTHDATE: 08-Mar-1946 , 65  yrs. old GENDER: Female ENDOSCOPIST: Hart Carwin, MD REFERRED BY:  Minus Breeding, M.D. PROCEDURE DATE:  01/12/2012 PROCEDURE:  EGD w/ biopsy ,Maloney dilation ASA CLASS:     Class II INDICATIONS:  dysphagia.   Barium esophagram 03/2011 showed esophageal dismotility, she is having heartburn and pill dysphagia, on Prilosec 20 mg/day. 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  The LB-GIF Q180 [id number] endoscope was introduced through the mouth and advanced to the second portion of the duodenum. Without limitations.  The instrument was slowly withdrawn as the mucosa was fully examined.      ESOPHAGUS: The mucosa of the esophagus appeared normal. there was no stricture, lumen not dilated, no retained food, Malone dilator 50 F and %2 F passed without difficulty, there was no blood on the dilator  STOMACH: Mild gastritis (inflammation) was found in the gastric antrum.  A biopsy was performed using cold forceps.  Sample obtained for helicobacter pylori testing.  Retroflexed views revealed no abnormalities.     The scope was then withdrawn from the patient and the procedure completed.  COMPLICATIONS: There were no complications. ENDOSCOPIC IMPRESSION: 1.   The mucosa of the esophagus appeared normal , no stricture, 50 ans 52 F maloney dilators passed through without resistance 2.   Gastritis (inflammation) was found in the gastric antrum; biopsy  RECOMMENDATIONS: Await pathology results dysphagia predominantle ti  pill is due to esophageal dysmotility documented on Barium esophagram last December 2012,  it may  affect the acid clearance and esophagitis, she ought to be on a long term acid supressing agent. Currently Prilosec 20 mg daily is not adequate. We will incerease to 40 mg bid x 2 weeks then 40 mg po hs Pt to take her medications with apple sauce or thick liqids  REPEAT EXAM: no recall needed  eSigned:  Hart Carwin, MD 01/12/2012 12:28 PM   CC:  PATIENT NAME:  Jean, Bowen MR#: #[id number]

## 2012-01-12 NOTE — Progress Notes (Signed)
Patient did not have preoperative order for IV antibiotic SSI prophylaxis. (G8918)  Patient did not experience any of the following events: a burn prior to discharge; a fall within the facility; wrong site/side/patient/procedure/implant event; or a hospital transfer or hospital admission upon discharge from the facility. (G8907)  

## 2012-01-12 NOTE — Progress Notes (Signed)
The pt tolerated the egd with dilatation very well. Maw   

## 2012-01-12 NOTE — Patient Instructions (Addendum)
YOU HAD AN ENDOSCOPIC PROCEDURE TODAY AT THE [facility]: Refer to the procedure report that was given to you for any specific questions about what was found during the examination.  If the procedure report does not answer your questions, please call your gastroenterologist to clarify.  If you requested that your care partner not be given the details of your procedure findings, then the procedure report has been included in a sealed envelope for you to review at your convenience later.  YOU SHOULD EXPECT: Some feelings of bloating in the abdomen. Passage of more gas than usual.  Walking can help get rid of the air that was put into your GI tract during the procedure and reduce the bloating. If you had a lower endoscopy (such as a colonoscopy or flexible sigmoidoscopy) you may notice spotting of blood in your stool or on the toilet paper. If you underwent a bowel prep for your procedure, then you may not have a normal bowel movement for a few days.  DIET: Clear liquids for one hour then soft foods for the rest of today.  Heavy or fried foods are harder to digest and may make you feel nauseous or bloated.  Likewise meals heavy in dairy and vegetables can cause extra gas to form and this can also increase the bloating.  Drink plenty of fluids but you should avoid alcoholic beverages for 24 hours.  ACTIVITY: Your care partner should take you home directly after the procedure.  You should plan to take it easy, moving slowly for the rest of the day.  You can resume normal activity the day after the procedure however you should NOT DRIVE or use heavy machinery for 24 hours (because of the sedation medicines used during the test).    SYMPTOMS TO REPORT IMMEDIATELY: A gastroenterologist can be reached at any hour.  During normal business hours, 8:30 AM to 5:00 PM Monday through Friday, call [phone].  After hours and on weekends, please call the GI answering service at [phone] who will take  a message and have the physician on call contact you.   Following lower endoscopy (colonoscopy or flexible sigmoidoscopy):  Excessive amounts of blood in the stool  Significant tenderness or worsening of abdominal pains  Swelling of the abdomen that is new, acute  Fever of 100F or higher  Following upper endoscopy (EGD)  Vomiting of blood or coffee ground material  New chest pain or pain under the shoulder blades  Painful or persistently difficult swallowing  New shortness of breath  Fever of 100F or higher  Black, tarry-looking stools  FOLLOW UP: If any biopsies were taken you will be contacted by phone or by letter within the next 1-3 weeks.  Call your gastroenterologist if you have not heard about the biopsies in 3 weeks.  Our staff will call the home number listed on your records the next business day following your procedure to check on you and address any questions or concerns that you may have at that time regarding the information given to you following your procedure. This is a courtesy call and so if there is no answer at the home number and we have not heard from you through the emergency physician on call, we will assume that you have returned to your regular daily activities without incident.  SIGNATURES/CONFIDENTIALITY: You and/or your care partner have signed paperwork which will be entered into your electronic medical record.  These signatures attest to the fact that that the information  above on your After Visit Summary has been reviewed and is understood.  Full responsibility of the confidentiality of this discharge information lies with you and/or your care-partner.  

## 2012-01-13 ENCOUNTER — Telehealth: Payer: Self-pay | Admitting: *Deleted

## 2012-01-13 NOTE — Telephone Encounter (Signed)
  Follow up Call-  Call back number 01/12/2012  Post procedure Call Back phone  # [phone]  Permission to leave phone message Yes     Patient questions:  Do you have a fever, pain , or abdominal swelling? no Pain Score  0 *  Have you tolerated food without any problems? yes  Have you been able to return to your normal activities? yes  Do you have any questions about your discharge instructions: Diet   no Medications  yes Follow up visit no  Do you have questions or concerns about your Care? no  Actions: * If pain score is 4 or above: No action needed, pain <4.  Pt. Unclear on how to take her prilosec.  Advised to take 40 mg bid for 2 weeks and then 40 mg. Daily at bedtime, per Dr. Regino Schultze note.

## 2012-01-17 ENCOUNTER — Encounter: Payer: Self-pay | Admitting: Internal Medicine

## 2012-01-27 DIAGNOSIS — H04129 Dry eye syndrome of unspecified lacrimal gland: Secondary | ICD-10-CM | POA: Diagnosis not present

## 2012-01-27 DIAGNOSIS — H43399 Other vitreous opacities, unspecified eye: Secondary | ICD-10-CM | POA: Diagnosis not present

## 2012-01-27 DIAGNOSIS — H26499 Other secondary cataract, unspecified eye: Secondary | ICD-10-CM | POA: Diagnosis not present

## 2012-01-27 DIAGNOSIS — Z961 Presence of intraocular lens: Secondary | ICD-10-CM | POA: Diagnosis not present

## 2012-02-04 ENCOUNTER — Encounter: Payer: Medicare Other | Admitting: Internal Medicine

## 2012-02-21 ENCOUNTER — Other Ambulatory Visit: Payer: Self-pay | Admitting: Dermatology

## 2012-02-21 DIAGNOSIS — T148 Other injury of unspecified body region: Secondary | ICD-10-CM | POA: Diagnosis not present

## 2012-02-21 DIAGNOSIS — L821 Other seborrheic keratosis: Secondary | ICD-10-CM | POA: Diagnosis not present

## 2012-02-21 DIAGNOSIS — L570 Actinic keratosis: Secondary | ICD-10-CM | POA: Diagnosis not present

## 2012-02-21 DIAGNOSIS — W57XXXA Bitten or stung by nonvenomous insect and other nonvenomous arthropods, initial encounter: Secondary | ICD-10-CM | POA: Diagnosis not present

## 2012-02-21 DIAGNOSIS — D485 Neoplasm of uncertain behavior of skin: Secondary | ICD-10-CM | POA: Diagnosis not present

## 2012-02-21 DIAGNOSIS — C44319 Basal cell carcinoma of skin of other parts of face: Secondary | ICD-10-CM | POA: Diagnosis not present

## 2012-02-21 DIAGNOSIS — L738 Other specified follicular disorders: Secondary | ICD-10-CM | POA: Diagnosis not present

## 2012-03-06 ENCOUNTER — Ambulatory Visit (INDEPENDENT_AMBULATORY_CARE_PROVIDER_SITE_OTHER): Payer: Medicare Other | Admitting: Endocrinology

## 2012-03-06 ENCOUNTER — Other Ambulatory Visit: Payer: Medicare Other

## 2012-03-06 VITALS — BP 116/80 | HR 77 | Temp 98.2°F | Wt 176.0 lb

## 2012-03-06 DIAGNOSIS — Z79899 Other long term (current) drug therapy: Secondary | ICD-10-CM

## 2012-03-06 DIAGNOSIS — Z0000 Encounter for general adult medical examination without abnormal findings: Secondary | ICD-10-CM

## 2012-03-06 DIAGNOSIS — E7800 Pure hypercholesterolemia, unspecified: Secondary | ICD-10-CM | POA: Diagnosis not present

## 2012-03-06 DIAGNOSIS — Z1331 Encounter for screening for depression: Secondary | ICD-10-CM | POA: Diagnosis not present

## 2012-03-06 DIAGNOSIS — I10 Essential (primary) hypertension: Secondary | ICD-10-CM

## 2012-03-06 DIAGNOSIS — M810 Age-related osteoporosis without current pathological fracture: Secondary | ICD-10-CM

## 2012-03-06 DIAGNOSIS — E039 Hypothyroidism, unspecified: Secondary | ICD-10-CM | POA: Diagnosis not present

## 2012-03-06 DIAGNOSIS — R32 Unspecified urinary incontinence: Secondary | ICD-10-CM | POA: Diagnosis not present

## 2012-03-06 LAB — CBC WITH DIFFERENTIAL/PLATELET
Basophils Absolute: 0 10*3/uL (ref 0.0–0.1)
Eosinophils Absolute: 0.2 10*3/uL (ref 0.0–0.7)
Eosinophils Relative: 3 % (ref 0–5)
Lymphocytes Relative: 35 % (ref 12–46)
MCH: 30.7 pg (ref 26.0–34.0)
MCV: 90 fL (ref 78.0–100.0)
Neutrophils Relative %: 49 % (ref 43–77)
Platelets: 252 10*3/uL (ref 150–400)
RDW: 13.8 % (ref 11.5–15.5)
WBC: 6.1 10*3/uL (ref 4.0–10.5)

## 2012-03-06 MED ORDER — ZOLEDRONIC ACID 5 MG/100ML IV SOLN: 5.0000 mg | Freq: Once | INTRAVENOUS | Status: DC

## 2012-03-06 MED ORDER — AMLODIPINE BESYLATE 2.5 MG PO TABS: 2.5000 mg | ORAL_TABLET | Freq: Every day | ORAL | Status: DC

## 2012-03-06 NOTE — Progress Notes (Signed)
Subjective:    Patient ID: Jean Bowen, female    DOB: [birth year], 66 y.o.   MRN: [id number]  HPI The state of at least three ongoing medical problems is addressed today, with interval history of each noted here: Osteoporosis: pt has tolerated reclast in the past. Hypothyroidism: she denies weight change. Dyslipidemia: she denies chest pain  and sob and dysuria. Past Medical History  Diagnosis Date  . URINARY INCONTINENCE 12/03/2006  . OSTEOPOROSIS 12/03/2006  . HYPOTHYROIDISM 12/03/2006  . HYPERTENSION 12/03/2006  . HYPERCHOLESTEROLEMIA 02/23/2010  . GERD 03/03/2007  . DEPRESSION 12/03/2006  . Hyperplastic colon polyp 12/03/2006  . ANGIOEDEMA 08/22/09  . DERMATITIS, PERIORAL 01/08/2010  . History of benign gastric tumor     significant for a benign gastric tumor that weighed approximately 5 pounds and was resected.  . OSA (obstructive sleep apnea)   . Hiatal hernia   . Diverticulosis     Past Surgical History  Procedure Date  . Cholecystectomy   . Knee arthroscopy     Bilateral-torn meniscus  . Esophagogastroduodenoscopy 11/17/2006  . Stress cardiolite 02/14/2006  . Tumor removal 2003    removal 5 lb benign tumor from abdomen  . 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 Bunionectomy     both feet  . Electrocardiogram 02/07/2006  . Hx of nasal septal reconstruction for deviated septum 1980's  . Cataract extraction     History   Social History  . Marital Status: Married    Spouse Name: N/A    Number of Children: N/A  . Years of Education: N/A   Occupational History  . 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 Smoking status: Former Smoker    Quit date: 05/22/2001  . Smokeless tobacco: Not on file  . Alcohol Use: Yes     Comment: 1-2/week  . Drug Use: No  . Sexually Active: Not on file   Other Topics Concern  . Not on file   Social History Narrative   1 dogRegular exercise-yes    Current  Outpatient Prescriptions on File Prior to Visit  Medication Sig Dispense Refill  . amLODipine (NORVASC) 2.5 MG tablet Take 1 tablet (2.5 mg total) by mouth daily.  90 tablet  3  . aspirin 81 MG tablet Take 81 mg by mouth daily.        . calcium carbonate (OS-CAL) 600 MG TABS Take 600 mg by mouth daily.        . cholecalciferol (VITAMIN D-400) 400 UNITS TABS Take 400 Units by mouth daily.        Marland Kitchen loratadine-pseudoephedrine (CLARITIN-D 24-HOUR) 10-240 MG per 24 hr tablet Take 1 tablet by mouth daily as needed. For allergies       . omeprazole (PRILOSEC) 40 MG capsule Take 1 capsule (40 mg total) by mouth daily.  90 capsule  3  . oxybutynin (DITROPAN) 5 MG tablet Take 1 tablet (5 mg total) by mouth 3 (three) times daily.  270 tablet  3  . triamcinolone ointment (KENALOG) 0.5 % Apply topically 2 (two) times daily.  30 g  0    Allergies  Allergen Reactions  . Doxycycline     REACTION: swollen lips and throat  . Losartan Potassium     REACTION: ? of angioedema    Family History  Problem Relation Age of Onset  . Melanoma Mother   . Colon cancer Father     at  advanced age  . Pancreatic cancer Brother   . Liver cancer Brother   . Arthritis Father   . Diabetes      grandmother  . Heart attack      grandfather  . Diabetes      grandfather    BP 116/80  Pulse 77  Temp 98.2 F (36.8 C) (Oral)  Wt 176 lb (79.833 kg)  SpO2 93%    Review of Systems  Respiratory: Negative for shortness of breath.   Genitourinary: Negative for dysuria.   Denies leg edema.    Objective:   Physical Exam VS: see vs page GEN: no distress HEAD: head: no deformity eyes: no periorbital swelling, no proptosis external nose and ears are normal mouth: no lesion seen NECK: supple, thyroid is not enlarged CHEST WALL: no deformity LUNGS:  Clear to auscultation CV: reg rate and rhythm, no murmur ABD: abdomen is soft, nontender.  no hepatosplenomegaly.  not distended.  no hernia MUSCULOSKELETAL:  muscle bulk and strength are grossly normal.  no obvious joint swelling.  gait is normal and steady EXTEMITIES: no deformity.  no ulcer on the feet.  feet are of normal color and temp.  no edema PULSES: dorsalis pedis intact bilat.  no carotid bruit NEURO:  cn 2-12 grossly intact.   readily moves all 4's.  sensation is intact to touch on the feet SKIN:  Normal texture and temperature.  No rash or suspicious lesion is visible.   NODES:  None palpable at the neck PSYCH: alert, oriented x3.  Does not appear anxious nor depressed.   Lab Results  Component Value Date   WBC 6.1 03/06/2012   HGB 14.2 03/06/2012   HCT 41.6 03/06/2012   PLT 252 03/06/2012   GLUCOSE 92 03/06/2012   CHOL 226* 03/06/2012   TRIG 287* 03/06/2012   HDL 48 03/06/2012   LDLDIRECT 95.8 03/02/2011   LDLCALC 121* 03/06/2012   ALT 19 03/06/2012   AST 17 03/06/2012   NA 141 03/06/2012   K 4.2 03/06/2012   CL 105 03/06/2012   CREATININE 0.63 03/06/2012   BUN 17 03/06/2012   CO2 26 03/06/2012   TSH 2.447 03/06/2012   HGBA1C 5.9 03/05/2009        Assessment & Plan:  Osteoporosis.  She is due for another infusion of reclast Dyslipidemia: she needs increased rx Hypothyroidism, well-replaced  Subjective:   Patient here for Medicare annual wellness visit and management of other chronic and acute problems.     Risk factors: advanced age    Roster of Physicians Providing Medical Care to Patient:  See "snapshot"   Activities of Daily Living: In your present state of health, do you have any difficulty performing the following activities?:  Preparing food and eating?: No  Bathing yourself: No  Getting dressed: No  Using the toilet:No  Moving around from place to place: No  In the past year have you fallen or had a near fall?: No    Home Safety: Has smoke detector and wears seat belts. Firearms are safely stored. No excess sun exposure.  Diet and Exercise  Current exercise habits: pt says good.  Dietary  issues discussed: pt reports a healthy diet   Depression Screen  Q1: Over the past two weeks, have you felt down, depressed or hopeless? no  Q2: Over the past two weeks, have you felt little interest or pleasure in doing things? no   The following portions of the patient's history were reviewed and updated as  appropriate: allergies, current medications, past family history, past medical history, past social history, past surgical history and problem list.   Review of Systems  Denies hearing loss, and visual loss Objective:   Vision:  Sees opthalmologist Hearing: grossly normal Body mass index:  See vs page Msk: pt easily and quickly performs "get-up-and-go" from a sitting position Cognitive Impairment Assessment: cognition, memory and judgment appear normal.  remembers 3/3 at 5 minutes.  excellent recall.  can easily read and write a sentence.  alert and oriented x 3.     Assessment:   Medicare wellness utd on preventive parameters.     Plan:   During the course of the visit the patient was educated and counseled about appropriate screening and preventive services including:       Fall prevention   Screening mammography  Bone densitometry screening  Diabetes screening  Nutrition counseling   Vaccines / LABS Zostavax / Pnemonccoal Vaccine  today   Patient Instructions (the written plan) was given to the patient.

## 2012-03-06 NOTE — Patient Instructions (Addendum)
please consider these measures for your health:  minimize alcohol.  do not use tobacco products.  have a colonoscopy at least every 10 years from age 66.  Women should have an annual mammogram from age 35.  keep firearms safely stored.  always use seat belts.  have working smoke alarms in your home.  see an eye doctor and dentist regularly.  never drive under the influence of alcohol or drugs (including prescription drugs).  those with fair skin should take precautions against the sun. please let me know what your wishes would be, if artificial life support measures should become necessary.  it is critically important to prevent falling down (keep floor areas well-lit, dry, and free of loose objects.  If you have a cane, walker, or wheelchair, you should use it, even for short trips around the house.  Also, try not to rush) i've requested another infusion of "reclast."  Please return in 1 year. (update: we discussed code status.  pt requests full code, but would not want to be started or maintained on artificial life-support measures if there was not a reasonable chance of recovery)

## 2012-03-07 LAB — BASIC METABOLIC PANEL
BUN: 17 mg/dL (ref 6–23)
CO2: 26 mEq/L (ref 19–32)
Calcium: 9.1 mg/dL (ref 8.4–10.5)
Chloride: 105 mEq/L (ref 96–112)
Creat: 0.63 mg/dL (ref 0.50–1.10)

## 2012-03-07 LAB — HEPATIC FUNCTION PANEL
Albumin: 4.2 g/dL (ref 3.5–5.2)
Alkaline Phosphatase: 56 U/L (ref 39–117)
Total Bilirubin: 0.4 mg/dL (ref 0.3–1.2)
Total Protein: 6.6 g/dL (ref 6.0–8.3)

## 2012-03-07 LAB — LIPID PANEL
Cholesterol: 226 mg/dL — ABNORMAL HIGH (ref 0–200)
HDL: 48 mg/dL (ref >39)
LDL Cholesterol: 121 mg/dL — ABNORMAL HIGH (ref 0–99)
Triglycerides: 287 mg/dL — ABNORMAL HIGH (ref <150)
VLDL: 57 mg/dL — ABNORMAL HIGH (ref 0–40)

## 2012-03-07 LAB — URINALYSIS, ROUTINE W REFLEX MICROSCOPIC
Bilirubin Urine: NEGATIVE
Ketones, ur: NEGATIVE mg/dL
Nitrite: NEGATIVE
Specific Gravity, Urine: 1.022 (ref 1.005–1.030)
pH: 5.5 (ref 5.0–8.0)

## 2012-03-07 LAB — PTH, INTACT AND CALCIUM: PTH: 83.8 pg/mL — ABNORMAL HIGH (ref 14.0–72.0)

## 2012-03-08 ENCOUNTER — Telehealth: Payer: Self-pay | Admitting: *Deleted

## 2012-03-08 NOTE — Telephone Encounter (Signed)
PATIENT NOTIFIED OF HER LAB RESULTS OF CHOLESTEROL. REQUEST AT THIS TIME NOT TO HAVE ANY MEDICATIONS CALLED IN FOR THIS AS DR. ELLISON SUGGESTED. PATIENT STATES WILL TRY TO HANDLE THIS WITH DIET AND EXERCISE.

## 2012-03-08 NOTE — Telephone Encounter (Signed)
Message copied by Elnora Morrison on Wed Mar 08, 2012 10:02 AM ------      Message from: Romero Belling      Created: Tue Mar 07, 2012  4:12 PM       please call patient:      Cholesterol is high.  i would be happy to prescribe a medication for you.

## 2012-03-14 ENCOUNTER — Encounter: Payer: Self-pay | Admitting: Endocrinology

## 2012-03-22 ENCOUNTER — Telehealth: Payer: Self-pay | Admitting: Endocrinology

## 2012-03-22 NOTE — Telephone Encounter (Signed)
Spoke with [facility] order faxed over to [facility] short stay

## 2012-03-24 ENCOUNTER — Other Ambulatory Visit: Payer: Self-pay

## 2012-03-24 MED ORDER — ACYCLOVIR 400 MG PO TABS: ORAL_TABLET | ORAL | Status: DC

## 2012-04-03 ENCOUNTER — Other Ambulatory Visit (HOSPITAL_COMMUNITY): Payer: Self-pay | Admitting: Endocrinology

## 2012-04-04 ENCOUNTER — Encounter (HOSPITAL_COMMUNITY): Payer: Medicare Other

## 2012-04-05 ENCOUNTER — Ambulatory Visit (HOSPITAL_COMMUNITY)
Admission: RE | Admit: 2012-04-05 | Discharge: 2012-04-05 | Disposition: A | Discharge status: Home / Self Care | Payer: Medicare Other | Source: Ambulatory Visit | Attending: Endocrinology | Admitting: Endocrinology

## 2012-04-05 ENCOUNTER — Encounter (HOSPITAL_COMMUNITY): Payer: Self-pay

## 2012-04-05 DIAGNOSIS — M810 Age-related osteoporosis without current pathological fracture: Secondary | ICD-10-CM | POA: Insufficient documentation

## 2012-04-05 MED ORDER — ZOLEDRONIC ACID 5 MG/100ML IV SOLN
5.0000 mg | Freq: Once | INTRAVENOUS | Status: AC
  Administered 2012-04-05: 5 mg via INTRAVENOUS
  Filled 2012-04-05: qty 100

## 2012-04-05 MED ORDER — SODIUM CHLORIDE 0.9 % IV SOLN
INTRAVENOUS | Status: AC
  Administered 2012-04-05: 14:00:00 via INTRAVENOUS

## 2012-04-24 DIAGNOSIS — C44319 Basal cell carcinoma of skin of other parts of face: Secondary | ICD-10-CM | POA: Diagnosis not present

## 2012-05-01 ENCOUNTER — Ambulatory Visit (INDEPENDENT_AMBULATORY_CARE_PROVIDER_SITE_OTHER)
Admission: RE | Admit: 2012-05-01 | Discharge: 2012-05-01 | Disposition: A | Discharge status: Home / Self Care | Payer: Medicare Other | Source: Ambulatory Visit

## 2012-05-01 DIAGNOSIS — M810 Age-related osteoporosis without current pathological fracture: Secondary | ICD-10-CM | POA: Diagnosis not present

## 2012-05-22 DIAGNOSIS — L570 Actinic keratosis: Secondary | ICD-10-CM | POA: Diagnosis not present

## 2012-05-31 ENCOUNTER — Encounter: Payer: Self-pay | Admitting: Pulmonary Disease

## 2012-06-22 DIAGNOSIS — H524 Presbyopia: Secondary | ICD-10-CM | POA: Diagnosis not present

## 2012-06-22 DIAGNOSIS — H43819 Vitreous degeneration, unspecified eye: Secondary | ICD-10-CM | POA: Diagnosis not present

## 2012-06-22 DIAGNOSIS — H43399 Other vitreous opacities, unspecified eye: Secondary | ICD-10-CM | POA: Diagnosis not present

## 2012-06-22 DIAGNOSIS — H04129 Dry eye syndrome of unspecified lacrimal gland: Secondary | ICD-10-CM | POA: Diagnosis not present

## 2012-06-22 DIAGNOSIS — H26499 Other secondary cataract, unspecified eye: Secondary | ICD-10-CM | POA: Diagnosis not present

## 2012-06-28 ENCOUNTER — Telehealth: Payer: Self-pay | Admitting: Endocrinology

## 2012-06-28 DIAGNOSIS — H9190 Unspecified hearing loss, unspecified ear: Secondary | ICD-10-CM | POA: Insufficient documentation

## 2012-06-28 DIAGNOSIS — H9193 Unspecified hearing loss, bilateral: Secondary | ICD-10-CM

## 2012-06-28 NOTE — Telephone Encounter (Signed)
done

## 2012-06-28 NOTE — Telephone Encounter (Signed)
The patient is requesting a referral to [facility].  The patient may be reached at [phone].

## 2012-06-30 DIAGNOSIS — M1710 Unilateral primary osteoarthritis, unspecified knee: Secondary | ICD-10-CM | POA: Diagnosis not present

## 2012-07-17 DIAGNOSIS — H903 Sensorineural hearing loss, bilateral: Secondary | ICD-10-CM | POA: Diagnosis not present

## 2012-08-14 ENCOUNTER — Other Ambulatory Visit: Payer: Self-pay | Admitting: *Deleted

## 2012-08-14 DIAGNOSIS — H26499 Other secondary cataract, unspecified eye: Secondary | ICD-10-CM | POA: Diagnosis not present

## 2012-08-14 MED ORDER — ACYCLOVIR 400 MG PO TABS: ORAL_TABLET | ORAL | Status: DC

## 2012-08-31 DIAGNOSIS — Z961 Presence of intraocular lens: Secondary | ICD-10-CM | POA: Diagnosis not present

## 2012-08-31 DIAGNOSIS — H26499 Other secondary cataract, unspecified eye: Secondary | ICD-10-CM | POA: Diagnosis not present

## 2012-09-13 ENCOUNTER — Other Ambulatory Visit: Payer: Self-pay

## 2012-09-13 MED ORDER — OXYBUTYNIN CHLORIDE 5 MG PO TABS: 5.0000 mg | ORAL_TABLET | Freq: Three times a day (TID) | ORAL | Status: DC

## 2012-09-15 ENCOUNTER — Other Ambulatory Visit: Payer: Self-pay | Admitting: *Deleted

## 2012-09-15 MED ORDER — OXYBUTYNIN CHLORIDE 5 MG PO TABS: 5.0000 mg | ORAL_TABLET | Freq: Three times a day (TID) | ORAL | Status: DC

## 2012-10-24 ENCOUNTER — Other Ambulatory Visit: Payer: Self-pay

## 2012-10-24 MED ORDER — ACYCLOVIR 400 MG PO TABS: ORAL_TABLET | ORAL | Status: DC

## 2012-11-22 ENCOUNTER — Other Ambulatory Visit: Payer: Self-pay

## 2012-12-14 ENCOUNTER — Other Ambulatory Visit: Payer: Self-pay | Admitting: Internal Medicine

## 2012-12-22 ENCOUNTER — Other Ambulatory Visit: Payer: Self-pay

## 2012-12-22 DIAGNOSIS — Z1231 Encounter for screening mammogram for malignant neoplasm of breast: Secondary | ICD-10-CM

## 2012-12-26 DIAGNOSIS — H43819 Vitreous degeneration, unspecified eye: Secondary | ICD-10-CM | POA: Diagnosis not present

## 2012-12-26 DIAGNOSIS — H04129 Dry eye syndrome of unspecified lacrimal gland: Secondary | ICD-10-CM | POA: Diagnosis not present

## 2012-12-26 DIAGNOSIS — Z961 Presence of intraocular lens: Secondary | ICD-10-CM | POA: Diagnosis not present

## 2013-01-11 ENCOUNTER — Ambulatory Visit
Admission: RE | Admit: 2013-01-11 | Discharge: 2013-01-11 | Disposition: A | Discharge status: Home / Self Care | Payer: Medicare Other | Source: Ambulatory Visit

## 2013-01-11 DIAGNOSIS — Z1231 Encounter for screening mammogram for malignant neoplasm of breast: Secondary | ICD-10-CM

## 2013-01-18 DIAGNOSIS — Z23 Encounter for immunization: Secondary | ICD-10-CM | POA: Diagnosis not present

## 2013-02-09 ENCOUNTER — Other Ambulatory Visit: Payer: Self-pay

## 2013-02-09 MED ORDER — AMLODIPINE BESYLATE 2.5 MG PO TABS: 2.5000 mg | ORAL_TABLET | Freq: Every day | ORAL | Status: DC

## 2013-02-09 MED ORDER — ACYCLOVIR 400 MG PO TABS: ORAL_TABLET | ORAL | Status: DC

## 2013-02-22 ENCOUNTER — Other Ambulatory Visit: Payer: Self-pay

## 2013-02-23 DIAGNOSIS — Z85828 Personal history of other malignant neoplasm of skin: Secondary | ICD-10-CM | POA: Diagnosis not present

## 2013-02-23 DIAGNOSIS — L821 Other seborrheic keratosis: Secondary | ICD-10-CM | POA: Diagnosis not present

## 2013-02-23 DIAGNOSIS — L738 Other specified follicular disorders: Secondary | ICD-10-CM | POA: Diagnosis not present

## 2013-02-23 DIAGNOSIS — D2370 Other benign neoplasm of skin of unspecified lower limb, including hip: Secondary | ICD-10-CM | POA: Diagnosis not present

## 2013-02-23 DIAGNOSIS — L570 Actinic keratosis: Secondary | ICD-10-CM | POA: Diagnosis not present

## 2013-02-23 DIAGNOSIS — D1801 Hemangioma of skin and subcutaneous tissue: Secondary | ICD-10-CM | POA: Diagnosis not present

## 2013-03-09 ENCOUNTER — Encounter: Payer: Medicare Other | Admitting: Endocrinology

## 2013-03-20 ENCOUNTER — Ambulatory Visit (INDEPENDENT_AMBULATORY_CARE_PROVIDER_SITE_OTHER): Payer: Medicare Other | Admitting: Endocrinology

## 2013-03-20 ENCOUNTER — Encounter: Payer: Self-pay | Admitting: Endocrinology

## 2013-03-20 VITALS — BP 118/80 | HR 95 | Temp 98.9°F | Ht 68.0 in | Wt 176.2 lb

## 2013-03-20 DIAGNOSIS — M810 Age-related osteoporosis without current pathological fracture: Secondary | ICD-10-CM | POA: Diagnosis not present

## 2013-03-20 DIAGNOSIS — E039 Hypothyroidism, unspecified: Secondary | ICD-10-CM | POA: Diagnosis not present

## 2013-03-20 DIAGNOSIS — Z79899 Other long term (current) drug therapy: Secondary | ICD-10-CM

## 2013-03-20 DIAGNOSIS — Z0000 Encounter for general adult medical examination without abnormal findings: Secondary | ICD-10-CM

## 2013-03-20 DIAGNOSIS — E7800 Pure hypercholesterolemia, unspecified: Secondary | ICD-10-CM | POA: Diagnosis not present

## 2013-03-20 DIAGNOSIS — I10 Essential (primary) hypertension: Secondary | ICD-10-CM | POA: Diagnosis not present

## 2013-03-20 LAB — URINALYSIS, ROUTINE W REFLEX MICROSCOPIC
Leukocytes, UA: NEGATIVE
Nitrite: NEGATIVE
Specific Gravity, Urine: >=1.03 (ref 1.000–1.030)
Total Protein, Urine: NEGATIVE

## 2013-03-20 LAB — LDL CHOLESTEROL, DIRECT: Direct LDL: 91.2 mg/dL

## 2013-03-20 LAB — HEPATIC FUNCTION PANEL
AST: 17 U/L (ref 0–37)
Alkaline Phosphatase: 50 U/L (ref 39–117)
Total Bilirubin: 0.5 mg/dL (ref 0.3–1.2)

## 2013-03-20 LAB — CBC WITH DIFFERENTIAL/PLATELET
Basophils Relative: 0.6 % (ref 0.0–3.0)
Eosinophils Absolute: 0.2 10*3/uL (ref 0.0–0.7)
HCT: 41.7 % (ref 36.0–46.0)
Hemoglobin: 13.9 g/dL (ref 12.0–15.0)
Lymphs Abs: 1.6 10*3/uL (ref 0.7–4.0)
MCHC: 33.4 g/dL (ref 30.0–36.0)
MCV: 91.1 fl (ref 78.0–100.0)
Monocytes Absolute: 0.6 10*3/uL (ref 0.1–1.0)
Neutro Abs: 2.5 10*3/uL (ref 1.4–7.7)
RBC: 4.58 Mil/uL (ref 3.87–5.11)

## 2013-03-20 LAB — BASIC METABOLIC PANEL
BUN: 15 mg/dL (ref 6–23)
CO2: 29 mEq/L (ref 19–32)
Chloride: 105 mEq/L (ref 96–112)
Creatinine, Ser: 0.6 mg/dL (ref 0.4–1.2)
Potassium: 4.9 mEq/L (ref 3.5–5.1)

## 2013-03-20 LAB — TSH: TSH: 2.48 u[IU]/mL (ref 0.35–5.50)

## 2013-03-20 LAB — LIPID PANEL
Cholesterol: 219 mg/dL — ABNORMAL HIGH (ref 0–200)
Total CHOL/HDL Ratio: 4
VLDL: 43.4 mg/dL — ABNORMAL HIGH (ref 0.0–40.0)

## 2013-03-20 NOTE — Progress Notes (Signed)
Subjective:    Patient ID: Jean Bowen, female    DOB: [birth year], 67 y.o.   MRN: [id number]  HPI The state of at least three ongoing medical problems is addressed today, with interval history of each noted here: Hypothyroidism: she denies weight change Dyslipidemia: she denies chest pain HTN: she denies sob Past Medical History  Diagnosis Date  . URINARY INCONTINENCE 12/03/2006  . OSTEOPOROSIS 12/03/2006  . HYPOTHYROIDISM 12/03/2006  . HYPERTENSION 12/03/2006  . HYPERCHOLESTEROLEMIA 02/23/2010  . GERD 03/03/2007  . DEPRESSION 12/03/2006  . Hyperplastic colon polyp 12/03/2006  . ANGIOEDEMA 08/22/09  . DERMATITIS, PERIORAL 01/08/2010  . History of benign gastric tumor     significant for a benign gastric tumor that weighed approximately 5 pounds and was resected.  . OSA (obstructive sleep apnea)   . Hiatal hernia   . Diverticulosis     Past Surgical History  Procedure Laterality Date  . Cholecystectomy    . Knee arthroscopy      Bilateral-torn meniscus  . Esophagogastroduodenoscopy  11/17/2006  . Stress cardiolite  02/14/2006  . Tumor removal  2003    removal 5 lb benign tumor from abdomen  . 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 Bunionectomy      both feet  . Electrocardiogram  02/07/2006  . Hx of nasal septal reconstruction for deviated septum  1980's  . Cataract extraction      History   Social History  . Marital Status: Married    Spouse Name: N/A    Number of Children: N/A  . Years of Education: N/A   Occupational History  . 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 Smoking status: Former Smoker    Quit date: 05/22/2001  . Smokeless tobacco: Not on file  . Alcohol Use: Yes     Comment: 1-2/week  . Drug Use: No  . Sexual Activity: Not on file   Other Topics Concern  . Not on file   Social History Narrative   1 dog   Regular exercise-yes          Current Outpatient Prescriptions on  File Prior to Visit  Medication Sig Dispense Refill  . acyclovir (ZOVIRAX) 400 MG tablet Take 2 tabs by mouth every 4 hours while awake.  90 tablet  1  . amLODipine (NORVASC) 2.5 MG tablet Take 1 tablet (2.5 mg total) by mouth daily.  90 tablet  3  . aspirin 81 MG tablet Take 81 mg by mouth daily.        . calcium carbonate (OS-CAL) 600 MG TABS Take 600 mg by mouth daily.        . cholecalciferol (VITAMIN D-400) 400 UNITS TABS Take 400 Units by mouth daily.        Marland Kitchen loratadine-pseudoephedrine (CLARITIN-D 24-HOUR) 10-240 MG per 24 hr tablet Take 1 tablet by mouth daily as needed. For allergies       . omeprazole (PRILOSEC) 40 MG capsule TAKE ONE CAPSULE BY MOUTH ONE TIME DAILY  90 capsule  1  . oxybutynin (DITROPAN) 5 MG tablet Take 1 tablet (5 mg total) by mouth 3 (three) times daily.  270 tablet  PRN  . zoledronic acid (RECLAST) 5 MG/100ML SOLN Inject 100 mLs (5 mg total) into the vein once.  100 mL  0  . [DISCONTINUED] oxybutynin (DITROPAN) 5 MG tablet Take 1 tablet (5 mg total) by mouth 3 (three) times  daily.  270 tablet  3   No current facility-administered medications on file prior to visit.   Allergies  Allergen Reactions  . Doxycycline     REACTION: swollen lips and throat  . Losartan Potassium     REACTION: ? of angioedema   Family History  Problem Relation Age of Onset  . Melanoma Mother   . Colon cancer Father     at advanced age  . Pancreatic cancer Brother   . Liver cancer Brother   . Arthritis Father   . Diabetes      grandmother  . Heart attack      grandfather  . Diabetes      grandfather   BP 118/80  Pulse 95  Temp(Src) 98.9 F (37.2 C) (Oral)  Ht 5\' 8"  (1.727 m)  Wt 176 lb 3 oz (79.918 kg)  BMI 26.80 kg/m2  SpO2 90%  Review of Systems Denies heartburn and edema.    Objective:   Physical Exam VS: see vs page GEN: no distress HEAD: head: no deformity eyes: no periorbital swelling, no proptosis external nose and ears are normal mouth: no lesion  seen NECK: supple, thyroid is not enlarged CHEST WALL: no deformity LUNGS:  Clear to auscultation BREASTS:  sees gyn CV: reg rate and rhythm, no murmur ABD: abdomen is soft, nontender.  no hepatosplenomegaly.  not distended.  no hernia GENITALIA/RECTAL: sees gyn MUSCULOSKELETAL: muscle bulk and strength are grossly normal.  no obvious joint swelling.  gait is normal and steady EXTEMITIES: no deformity.  no ulcer on the feet.  feet are of normal color and temp.  no edema.  There is bilateral onychomycosis.   PULSES: dorsalis pedis intact bilat.  no carotid bruit NEURO:  cn 2-12 grossly intact.   readily moves all 4's.  sensation is intact to touch on the feet SKIN:  Normal texture and temperature.  No rash or suspicious lesion is visible.   NODES:  None palpable at the neck PSYCH: alert, oriented x3.  Does not appear anxious nor depressed.     Lab Results  Component Value Date   WBC 5.0 03/20/2013   HGB 13.9 03/20/2013   HCT 41.7 03/20/2013   PLT 259.0 03/20/2013   GLUCOSE 94 03/20/2013   CHOL 219* 03/20/2013   TRIG 217.0* 03/20/2013   HDL 55.00 03/20/2013   LDLDIRECT 91.2 03/20/2013   LDLCALC 121* 03/06/2012   ALT 25 03/20/2013   AST 17 03/20/2013   NA 141 03/20/2013   K 4.9 03/20/2013   CL 105 03/20/2013   CREATININE 0.6 03/20/2013   BUN 15 03/20/2013   CO2 29 03/20/2013   TSH 2.48 03/20/2013   HGBA1C 5.9 03/05/2009      Assessment & Plan:  HTN: well-controlled Dyslipidemia: well-controlled Hypothyroidism: well-replaced   Subjective:   Patient here for Medicare annual wellness visit and management of other chronic and acute problems.     Risk factors: advanced age    Roster of Physicians Providing Medical Care to Patient:  See "snapshot"   Activities of Daily Living: In your present state of health, do you have any difficulty performing the following activities?:  Preparing food and eating?: No  Bathing yourself: No  Getting dressed: No  Using the toilet:No  Moving  around from place to place: No  In the past year have you fallen or had a near fall?:No    Home Safety: Has smoke detector and wears seat belts. No firearms. No excess sun exposure.  Diet and  Exercise  Current exercise habits: pt says good Dietary issues discussed: pt reports a healthy diet   Depression Screen  Q1: Over the past two weeks, have you felt down, depressed or hopeless? no  Q2: Over the past two weeks, have you felt little interest or pleasure in doing things? no   The following portions of the patient's history were reviewed and updated as appropriate: allergies, current medications, past family history, past medical history, past social history, past surgical history and problem list.  Past Medical History  Diagnosis Date  . URINARY INCONTINENCE 12/03/2006  . OSTEOPOROSIS 12/03/2006  . HYPOTHYROIDISM 12/03/2006  . HYPERTENSION 12/03/2006  . HYPERCHOLESTEROLEMIA 02/23/2010  . GERD 03/03/2007  . DEPRESSION 12/03/2006  . Hyperplastic colon polyp 12/03/2006  . ANGIOEDEMA 08/22/09  . DERMATITIS, PERIORAL 01/08/2010  . History of benign gastric tumor     significant for a benign gastric tumor that weighed approximately 5 pounds and was resected.  . OSA (obstructive sleep apnea)   . Hiatal hernia   . Diverticulosis     Past Surgical History  Procedure Laterality Date  . Cholecystectomy    . Knee arthroscopy      Bilateral-torn meniscus  . Esophagogastroduodenoscopy  11/17/2006  . Stress cardiolite  02/14/2006  . Tumor removal  2003    removal 5 lb benign tumor from abdomen  . 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 Bunionectomy      both feet  . Electrocardiogram  02/07/2006  . Hx of nasal septal reconstruction for deviated septum  1980's  . Cataract extraction      History   Social History  . Marital Status: Married    Spouse Name: N/A    Number of Children: N/A  . Years of Education: N/A   Occupational History  .  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 Smoking status: Former Smoker    Quit date: 05/22/2001  . Smokeless tobacco: Not on file  . Alcohol Use: Yes     Comment: 1-2/week  . Drug Use: No  . Sexual Activity: Not on file   Other Topics Concern  . Not on file   Social History Narrative   1 dog   Regular exercise-yes          Current Outpatient Prescriptions on File Prior to Visit  Medication Sig Dispense Refill  . acyclovir (ZOVIRAX) 400 MG tablet Take 2 tabs by mouth every 4 hours while awake.  90 tablet  1  . amLODipine (NORVASC) 2.5 MG tablet Take 1 tablet (2.5 mg total) by mouth daily.  90 tablet  3  . aspirin 81 MG tablet Take 81 mg by mouth daily.        . calcium carbonate (OS-CAL) 600 MG TABS Take 600 mg by mouth daily.        . cholecalciferol (VITAMIN D-400) 400 UNITS TABS Take 400 Units by mouth daily.        Marland Kitchen loratadine-pseudoephedrine (CLARITIN-D 24-HOUR) 10-240 MG per 24 hr tablet Take 1 tablet by mouth daily as needed. For allergies       . omeprazole (PRILOSEC) 40 MG capsule TAKE ONE CAPSULE BY MOUTH ONE TIME DAILY  90 capsule  1  . oxybutynin (DITROPAN) 5 MG tablet Take 1 tablet (5 mg total) by mouth 3 (three) times daily.  270 tablet  PRN  . zoledronic acid (RECLAST) 5 MG/100ML SOLN Inject 100 mLs (  5 mg total) into the vein once.  100 mL  0  . [DISCONTINUED] oxybutynin (DITROPAN) 5 MG tablet Take 1 tablet (5 mg total) by mouth 3 (three) times daily.  270 tablet  3   No current facility-administered medications on file prior to visit.    Allergies  Allergen Reactions  . Doxycycline     REACTION: swollen lips and throat  . Losartan Potassium     REACTION: ? of angioedema    Family History  Problem Relation Age of Onset  . Melanoma Mother   . Colon cancer Father     at advanced age  . Pancreatic cancer Brother   . Liver cancer Brother   . Arthritis Father   . Diabetes      grandmother  . Heart attack      grandfather   . Diabetes      grandfather   BP 118/80  Pulse 95  Temp(Src) 98.9 F (37.2 C) (Oral)  Ht 5\' 8"  (1.727 m)  Wt 176 lb 3 oz (79.918 kg)  BMI 26.80 kg/m2  SpO2 90%  Review of Systems  Denies hearing loss, and visual loss Objective:   Vision:  Sees opthalmologist Hearing: grossly normal Body mass index:  See vs page Msk: pt easily and quickly performs "get-up-and-go" from a sitting position Cognitive Impairment Assessment: cognition, memory and judgment appear normal.  remembers 3/3 at 5 minutes.  excellent recall.  can easily read and write a sentence.  alert and oriented x 3.    Assessment:   Medicare wellness utd on preventive parameters.  Plan:   During the course of the visit the patient was educated and counseled about appropriate screening and preventive services including:        Fall prevention   Screening mammography  Bone densitometry screening  Diabetes screening  Nutrition counseling   Vaccines / LABS Zostavax / Pneumococcal Vaccine  Are up to date today.    Patient Instructions (the written plan) was given to the patient.

## 2013-03-20 NOTE — Patient Instructions (Addendum)
please consider these measures for your health:  minimize alcohol.  do not use tobacco products.  have a colonoscopy at least every 10 years from age 67.  Women should have an annual mammogram from age 26.  keep firearms safely stored.  always use seat belts.  have working smoke alarms in your home.  see an eye doctor and dentist regularly.  never drive under the influence of alcohol or drugs (including prescription drugs).  those with fair skin should take precautions against the sun.   i'll request for you the reclast.  you will receive a phone call, about a day and time for an appointment.   Please return in 1 year.

## 2013-03-23 ENCOUNTER — Encounter: Payer: Self-pay | Admitting: Endocrinology

## 2013-03-26 NOTE — Telephone Encounter (Signed)
Please reschedule to January, thanks.   Please advise pt of new date.

## 2013-04-05 ENCOUNTER — Ambulatory Visit (HOSPITAL_COMMUNITY)
Admission: RE | Admit: 2013-04-05 | Discharge: 2013-04-05 | Disposition: A | Discharge status: Home / Self Care | Payer: Medicare Other | Source: Ambulatory Visit | Attending: Endocrinology | Admitting: Endocrinology

## 2013-04-05 ENCOUNTER — Other Ambulatory Visit (HOSPITAL_COMMUNITY): Payer: Self-pay | Admitting: Endocrinology

## 2013-04-05 ENCOUNTER — Encounter (HOSPITAL_COMMUNITY): Payer: Self-pay

## 2013-04-05 DIAGNOSIS — M810 Age-related osteoporosis without current pathological fracture: Secondary | ICD-10-CM | POA: Insufficient documentation

## 2013-04-05 DIAGNOSIS — K219 Gastro-esophageal reflux disease without esophagitis: Secondary | ICD-10-CM | POA: Diagnosis not present

## 2013-04-05 MED ORDER — SODIUM CHLORIDE 0.9 % IV SOLN: Freq: Once | INTRAVENOUS | Status: DC

## 2013-04-05 MED ORDER — ZOLEDRONIC ACID 5 MG/100ML IV SOLN
5.0000 mg | Freq: Once | INTRAVENOUS | Status: AC
  Administered 2013-04-05: 5 mg via INTRAVENOUS
  Filled 2013-04-05: qty 100

## 2013-04-05 MED ORDER — ZOLEDRONIC ACID 5 MG/100ML IV SOLN: 5.0000 mg | Freq: Once | INTRAVENOUS | Status: DC

## 2013-04-05 MED ORDER — SODIUM CHLORIDE 0.9 % IV SOLN
Freq: Once | INTRAVENOUS | Status: AC
  Administered 2013-04-05: 250 mL via INTRAVENOUS

## 2013-04-10 ENCOUNTER — Inpatient Hospital Stay (HOSPITAL_COMMUNITY): Admission: RE | Admit: 2013-04-10 | Payer: Medicare Other | Source: Ambulatory Visit

## 2013-04-10 ENCOUNTER — Other Ambulatory Visit (HOSPITAL_COMMUNITY): Payer: Self-pay | Admitting: Endocrinology

## 2013-05-01 ENCOUNTER — Other Ambulatory Visit: Payer: Self-pay | Admitting: Endocrinology

## 2013-06-08 ENCOUNTER — Other Ambulatory Visit: Payer: Self-pay | Admitting: *Deleted

## 2013-06-08 MED ORDER — OMEPRAZOLE 40 MG PO CPDR: 40.0000 mg | DELAYED_RELEASE_CAPSULE | Freq: Every day | ORAL | Status: DC

## 2013-09-02 ENCOUNTER — Ambulatory Visit (INDEPENDENT_AMBULATORY_CARE_PROVIDER_SITE_OTHER): Payer: Medicare Other | Admitting: Family Medicine

## 2013-09-02 VITALS — BP 132/86 | HR 84 | Temp 97.3°F | Resp 15 | Ht 66.5 in | Wt 176.6 lb

## 2013-09-02 DIAGNOSIS — T148 Other injury of unspecified body region: Secondary | ICD-10-CM | POA: Diagnosis not present

## 2013-09-02 DIAGNOSIS — L299 Pruritus, unspecified: Secondary | ICD-10-CM

## 2013-09-02 DIAGNOSIS — W57XXXA Bitten or stung by nonvenomous insect and other nonvenomous arthropods, initial encounter: Secondary | ICD-10-CM

## 2013-09-02 MED ORDER — PREDNISONE 20 MG PO TABS: 40.0000 mg | ORAL_TABLET | Freq: Every day | ORAL | Status: DC

## 2013-09-02 MED ORDER — FLUOCINONIDE-E 0.05 % EX CREA: 1.0000 "application " | TOPICAL_CREAM | Freq: Two times a day (BID) | CUTANEOUS | Status: DC | PRN

## 2013-09-02 MED ORDER — HYDROXYZINE HCL 25 MG PO TABS: 25.0000 mg | ORAL_TABLET | Freq: Four times a day (QID) | ORAL | Status: DC | PRN

## 2013-09-02 NOTE — Progress Notes (Signed)
Subjective:    Patient ID: Jean Bowen, female    DOB: 09/25/1945, 68 y.o.   MRN: [id number] This chart was scribed for Jean Ray, MD by Vernell Barrier, Medical Scribe. This patient's care was started at 12:10 PM.   Rash Pertinent negatives include no fever or shortness of breath.   HPI Comments: Jean Bowen is a 68 y.o. female who presents to the [facility] complaining of a diffuse itchy rash located on her back; onset 3-4 days ago. Does also state one area of possible rash presented on her right dorsal foot over 1 week ago but she did not pay any attention to it. States she has been working in her garden and believes she may have been bitten by something. Believes something similar to this has presented before but not this aggressive and persistent. Has been treating with fluocinonide .05% as prescribed by her doctor; using 4x per day. Prescription is 69 years old and has since expired. Reports no rash on the chest or in between fingers. States itchiness has been keeping her up at night. Has also been using Claritin 1 pill once a day for the past 2 weeks for pollen and allergies; believes this has been providing some relief. Does report she had a tick bite 1 month ago. Husband has not had any similar symptoms but does share a bed with pt. Denies SOB, trouble swallowing, fever, or chills.   Patient Active Problem List   Diagnosis Date Noted  . Hearing loss 06/28/2012  . Encounter for long-term (current) use of other medications 03/06/2012  . Dysphagia 03/02/2011  . HYPERCHOLESTEROLEMIA 02/23/2010  . DERMATITIS, PERIORAL 01/08/2010  . ANGIOEDEMA 12/11/2009  . GERD 03/03/2007  . HYPOTHYROIDISM 12/03/2006  . DEPRESSION 12/03/2006  . HYPERTENSION 12/03/2006  . OSTEOPOROSIS 12/03/2006  . URINARY INCONTINENCE 12/03/2006  . COLONIC POLYPS, HX OF 12/03/2006   Past Medical History  Diagnosis Date  . URINARY INCONTINENCE 12/03/2006  . OSTEOPOROSIS 12/03/2006  .  HYPOTHYROIDISM 12/03/2006  . HYPERTENSION 12/03/2006  . HYPERCHOLESTEROLEMIA 02/23/2010  . GERD 03/03/2007  . DEPRESSION 12/03/2006  . Hyperplastic colon polyp 12/03/2006  . ANGIOEDEMA 08/22/09  . DERMATITIS, PERIORAL 01/08/2010  . History of benign gastric tumor     significant for a benign gastric tumor that weighed approximately 5 pounds and was resected.  . OSA (obstructive sleep apnea)   . Hiatal hernia   . Diverticulosis    Past Surgical History  Procedure Laterality Date  . Cholecystectomy    . Knee arthroscopy      Bilateral-torn meniscus  . Esophagogastroduodenoscopy  11/17/2006  . Stress cardiolite  02/14/2006  . Tumor removal  2003    removal 5 lb benign tumor from abdomen  . 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 Bunionectomy      both feet  . Electrocardiogram  02/07/2006  . Hx of nasal septal reconstruction for deviated septum  1980's  . Cataract extraction    . Eye surgery     Allergies  Allergen Reactions  . Doxycycline     REACTION: swollen lips and throat  . Losartan Potassium     REACTION: ? of angioedema   Prior to Admission medications   Medication Sig Start Date End Date Taking? Authorizing Provider  acyclovir (ZOVIRAX) 400 MG tablet TAKE 2 TABS BY MOUTH EVERY 4 HOURS WHILE AWAKE. 05/01/13  Yes Renato Shin, MD  amLODipine (NORVASC) 2.5 MG tablet Take  1 tablet (2.5 mg total) by mouth daily. 02/09/13  Yes Renato Shin, MD  aspirin 81 MG tablet Take 81 mg by mouth daily.     Yes Historical Provider, MD  calcium carbonate (OS-CAL) 600 MG TABS Take 600 mg by mouth daily.     Yes Historical Provider, MD  cholecalciferol (VITAMIN D-400) 400 UNITS TABS Take 400 Units by mouth daily.     Yes Historical Provider, MD  fluocinonide cream (LIDEX) 1.61 % Apply 1 application topically as needed.   Yes Historical Provider, MD  loratadine-pseudoephedrine (CLARITIN-D 24-HOUR) 10-240 MG per 24 hr tablet Take 1 tablet by mouth daily as needed.  For allergies    Yes Historical Provider, MD  omeprazole (PRILOSEC) 40 MG capsule Take 1 capsule (40 mg total) by mouth daily. 06/08/13  Yes Renato Shin, MD  oxybutynin (DITROPAN) 5 MG tablet Take 1 tablet (5 mg total) by mouth 3 (three) times daily. 09/15/12 09/15/13 Yes Renato Shin, MD  zoledronic acid (RECLAST) 5 MG/100ML SOLN Inject 100 mLs (5 mg total) into the vein once. 03/06/12  Yes Renato Shin, MD   History   Social History  . Marital Status: Married    Spouse Name: N/A    Number of Children: N/A  . Years of Education: N/A   Occupational History  . 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 Smoking status: Former Smoker    Quit date: 05/22/2001  . Smokeless tobacco: Not on file  . Alcohol Use: Yes     Comment: 1-2/week  . Drug Use: No  . Sexual Activity: Not on file   Other Topics Concern  . Not on file   Social History Narrative   1 dog   Regular exercise-yes         Review of Systems  Constitutional: Negative for fever and chills.  HENT: Negative for trouble swallowing.   Respiratory: Negative for shortness of breath.   Skin: Positive for rash.   Objective:  Physical Exam  Nursing note and vitals reviewed. Constitutional: She is oriented to person, place, and time. She appears well-developed and well-nourished. No distress.  HENT:  Head: Normocephalic and atraumatic.  Eyes: EOM are normal.  Neck: Neck supple. No tracheal deviation present.  Cardiovascular: Normal rate.   Pulmonary/Chest: Effort normal. No respiratory distress.  Musculoskeletal: Normal range of motion.  Neurological: She is alert and oriented to person, place, and time.  Skin: Skin is warm and dry. Rash noted.  Few scattered erythematous papules with slight central elevation on right posterior shoulder blade. One area of three in a row. One small erythematous papule at left lower lumbar area. One on right mid to lower back. One on right lateral thigh. One on  right anterior ankle.  Psychiatric: She has a normal mood and affect. Her behavior is normal.   Filed Vitals:   09/02/13 1145  BP: 132/86  Pulse: 84  Temp: 97.3 F (36.3 C)  TempSrc: Oral  Resp: 15  Height: 5' 6.5" (1.689 m)  Weight: 176 lb 9.6 oz (80.105 kg)  SpO2: 95%    Assessment & Plan:   Camylle Whicker is a 68 y.o. female Pruritus - Plan: hydrOXYzine (ATARAX/VISTARIL) 25 MG tablet, fluocinonide-emollient (LIDEX-E) 0.05 % cream, predniSONE (DELTASONE) 20 MG tablet  Bug bites - Plan: hydrOXYzine (ATARAX/VISTARIL) 25 MG tablet, fluocinonide-emollient (LIDEX-E) 0.05 % cream  Scattered papules - appear to be bites, without apparent infection and with appearance - possible bed bugs vs other insects.  Continue topical steroid, hydroxyzine 25-50mg  Q6h prn - SED and fall/sedation precautions. If not improving in next few days - prednisone Rx given.  rtc precautions.     Meds ordered this encounter  Medications  . fluocinonide cream (LIDEX) 0.05 %    Sig: Apply 1 application topically as needed.  . hydrOXYzine (ATARAX/VISTARIL) 25 MG tablet    Sig: Take 1-2 tablets (25-50 mg total) by mouth every 6 (six) hours as needed for itching.    Dispense:  30 tablet    Refill:  0  . fluocinonide-emollient (LIDEX-E) 0.05 % cream    Sig: Apply 1 application topically 2 (two) times daily as needed.    Dispense:  30 g    Refill:  0  . predniSONE (DELTASONE) 20 MG tablet    Sig: Take 2 tablets (40 mg total) by mouth daily with breakfast.    Dispense:  10 tablet    Refill:  0   Patient Instructions  Your rash appears to be form bug bites, and possible bed bugs - check mattress corners as discussed. steroid cream to affected areas, and if not improving in 2-3 days., can start prednisone.  Hydroxyzine - start with one at a time for itching. Return to the clinic or go to the nearest emergency room if any of your symptoms worsen or new symptoms occur. Pruritus  Pruritis is an itch. There are  many different problems that can cause an itch. Dry skin is one of the most common causes of itching. Most cases of itching do not require medical attention.  HOME CARE INSTRUCTIONS  Make sure your skin is moistened on a regular basis. A moisturizer that contains petroleum jelly is best for keeping moisture in your skin. If you develop a rash, you may try the following for relief:   Use corticosteroid cream.  Apply cool compresses to the affected areas.  Bathe with Epsom salts or baking soda in the bathwater.  Soak in colloidal oatmeal baths. These are available at your pharmacy.  Apply baking soda paste to the rash. Stir water into baking soda until it reaches a paste-like consistency.  Use an anti-itch lotion.  Take over-the-counter diphenhydramine medicine by mouth as the instructions direct.  Avoid scratching. Scratching may cause the rash to become infected. If itching is very bad, your caregiver may suggest prescription lotions or creams to lessen your symptoms.  Avoid hot showers, which can make itching worse. A cold shower may help with itching as long as you use a moisturizer after the shower. SEEK MEDICAL CARE IF: The itching does not go away after several days. Document Released: 12/16/2010 Document Revised: 06/28/2011 Document Reviewed: 12/16/2010 [facility] Patient Information 2014 [place], [place]. Insect Bite Mosquitoes, flies, fleas, bedbugs, and many other insects can bite. Insect bites are different from insect stings. A sting is when venom is injected into the skin. Some insect bites can transmit infectious diseases. SYMPTOMS  Insect bites usually turn red, swell, and itch for 2 to 4 days. They often go away on their own. TREATMENT  Your caregiver may prescribe antibiotic medicines if a bacterial infection develops in the bite. HOME CARE INSTRUCTIONS  Do not scratch the bite area.  Keep the bite area clean and dry. Wash the bite area thoroughly with soap and  water.  Put ice or cool compresses on the bite area.  Put ice in a plastic bag.  Place a towel between your skin and the bag.  Leave the ice on for 20 minutes,  4 times a day for the first 2 to 3 days, or as directed.  You may apply a baking soda paste, cortisone cream, or calamine lotion to the bite area as directed by your caregiver. This can help reduce itching and swelling.  Only take over-the-counter or prescription medicines as directed by your caregiver.  If you are given antibiotics, take them as directed. Finish them even if you start to feel better. You may need a tetanus shot if:  You cannot remember when you had your last tetanus shot.  You have never had a tetanus shot.  The injury broke your skin. If you get a tetanus shot, your arm may swell, get red, and feel warm to the touch. This is common and not a problem. If you need a tetanus shot and you choose not to have one, there is a rare chance of getting tetanus. Sickness from tetanus can be serious. SEEK IMMEDIATE MEDICAL CARE IF:   You have increased pain, redness, or swelling in the bite area.  You see a red line on the skin coming from the bite.  You have a fever.  You have joint pain.  You have a headache or neck pain.  You have unusual weakness.  You have a rash.  You have chest pain or shortness of breath.  You have abdominal pain, nausea, or vomiting.  You feel unusually tired or sleepy. MAKE SURE YOU:   Understand these instructions.  Will watch your condition.  Will get help right away if you are not doing well or get worse. Document Released: 05/13/2004 Document Revised: 06/28/2011 Document Reviewed: 11/04/2010 [facility] Patient Information 2014 [place].

## 2013-09-02 NOTE — Patient Instructions (Addendum)
Your rash appears to be form bug bites, and possible bed bugs - check mattress corners as discussed. steroid cream to affected areas, and if not improving in 2-3 days., can start prednisone.  Hydroxyzine - start with one at a time for itching. Return to the clinic or go to the nearest emergency room if any of your symptoms worsen or new symptoms occur. Pruritus  Pruritis is an itch. There are many different problems that can cause an itch. Dry skin is one of the most common causes of itching. Most cases of itching do not require medical attention.  HOME CARE INSTRUCTIONS  Make sure your skin is moistened on a regular basis. A moisturizer that contains petroleum jelly is best for keeping moisture in your skin. If you develop a rash, you may try the following for relief:   Use corticosteroid cream.  Apply cool compresses to the affected areas.  Bathe with Epsom salts or baking soda in the bathwater.  Soak in colloidal oatmeal baths. These are available at your pharmacy.  Apply baking soda paste to the rash. Stir water into baking soda until it reaches a paste-like consistency.  Use an anti-itch lotion.  Take over-the-counter diphenhydramine medicine by mouth as the instructions direct.  Avoid scratching. Scratching may cause the rash to become infected. If itching is very bad, your caregiver may suggest prescription lotions or creams to lessen your symptoms.  Avoid hot showers, which can make itching worse. A cold shower may help with itching as long as you use a moisturizer after the shower. SEEK MEDICAL CARE IF: The itching does not go away after several days. Document Released: 12/16/2010 Document Revised: 06/28/2011 Document Reviewed: 12/16/2010 [facility] Patient Information 2014 [place], [place]. Insect Bite Mosquitoes, flies, fleas, bedbugs, and many other insects can bite. Insect bites are different from insect stings. A sting is when venom is injected into the skin. Some insect  bites can transmit infectious diseases. SYMPTOMS  Insect bites usually turn red, swell, and itch for 2 to 4 days. They often go away on their own. TREATMENT  Your caregiver may prescribe antibiotic medicines if a bacterial infection develops in the bite. HOME CARE INSTRUCTIONS  Do not scratch the bite area.  Keep the bite area clean and dry. Wash the bite area thoroughly with soap and water.  Put ice or cool compresses on the bite area.  Put ice in a plastic bag.  Place a towel between your skin and the bag.  Leave the ice on for 20 minutes, 4 times a day for the first 2 to 3 days, or as directed.  You may apply a baking soda paste, cortisone cream, or calamine lotion to the bite area as directed by your caregiver. This can help reduce itching and swelling.  Only take over-the-counter or prescription medicines as directed by your caregiver.  If you are given antibiotics, take them as directed. Finish them even if you start to feel better. You may need a tetanus shot if:  You cannot remember when you had your last tetanus shot.  You have never had a tetanus shot.  The injury broke your skin. If you get a tetanus shot, your arm may swell, get red, and feel warm to the touch. This is common and not a problem. If you need a tetanus shot and you choose not to have one, there is a rare chance of getting tetanus. Sickness from tetanus can be serious. SEEK IMMEDIATE MEDICAL CARE IF:   You have increased  pain, redness, or swelling in the bite area.  You see a red line on the skin coming from the bite.  You have a fever.  You have joint pain.  You have a headache or neck pain.  You have unusual weakness.  You have a rash.  You have chest pain or shortness of breath.  You have abdominal pain, nausea, or vomiting.  You feel unusually tired or sleepy. MAKE SURE YOU:   Understand these instructions.  Will watch your condition.  Will get help right away if you are not  doing well or get worse. Document Released: 05/13/2004 Document Revised: 06/28/2011 Document Reviewed: 11/04/2010 [facility] Patient Information 2014 [place].

## 2013-11-18 ENCOUNTER — Ambulatory Visit (INDEPENDENT_AMBULATORY_CARE_PROVIDER_SITE_OTHER): Payer: Medicare Other | Admitting: Emergency Medicine

## 2013-11-18 VITALS — BP 128/90 | HR 79 | Temp 98.4°F | Resp 12 | Ht 67.0 in | Wt 178.4 lb

## 2013-11-18 DIAGNOSIS — W57XXXA Bitten or stung by nonvenomous insect and other nonvenomous arthropods, initial encounter: Secondary | ICD-10-CM

## 2013-11-18 DIAGNOSIS — T148 Other injury of unspecified body region: Secondary | ICD-10-CM

## 2013-11-18 NOTE — Progress Notes (Signed)
[facility] [address] [place] [address] [phone]  Date:  11/18/2013   Name:  Jean Bowen   DOB:  October 24, 1945   MRN:  [id number]  PCP:  Renato Shin, MD    Chief Complaint: Insect Bite   History of Present Illness:  Jean Bowen is a 68 y.o. very pleasant female patient who presents with the following:  Bitten by an insect last week and now has a 1 inch diameter bulla on right leg.  Not pruritic No improvement with over the counter medications or other home remedies.  Denies other complaint or health concern today.   Patient Active Problem List   Diagnosis Date Noted  . Hearing loss 06/28/2012  . Encounter for long-term (current) use of other medications 03/06/2012  . Dysphagia 03/02/2011  . HYPERCHOLESTEROLEMIA 02/23/2010  . DERMATITIS, PERIORAL 01/08/2010  . ANGIOEDEMA 12/11/2009  . GERD 03/03/2007  . HYPOTHYROIDISM 12/03/2006  . DEPRESSION 12/03/2006  . HYPERTENSION 12/03/2006  . OSTEOPOROSIS 12/03/2006  . URINARY INCONTINENCE 12/03/2006  . COLONIC POLYPS, HX OF 12/03/2006    Past Medical History  Diagnosis Date  . URINARY INCONTINENCE 12/03/2006  . OSTEOPOROSIS 12/03/2006  . HYPOTHYROIDISM 12/03/2006  . HYPERTENSION 12/03/2006  . HYPERCHOLESTEROLEMIA 02/23/2010  . GERD 03/03/2007  . DEPRESSION 12/03/2006  . Hyperplastic colon polyp 12/03/2006  . ANGIOEDEMA 08/22/09  . DERMATITIS, PERIORAL 01/08/2010  . History of benign gastric tumor     significant for a benign gastric tumor that weighed approximately 5 pounds and was resected.  . OSA (obstructive sleep apnea)   . Hiatal hernia   . Diverticulosis     Past Surgical History  Procedure Laterality Date  . Cholecystectomy    . Knee arthroscopy      Bilateral-torn meniscus  . Esophagogastroduodenoscopy  11/17/2006  . Stress cardiolite  02/14/2006  . Tumor removal  2003    removal 5 lb benign tumor from abdomen  . Skin cancer excision      non melanoma cancer removed from right  leg  . Bunionectomy      both feet  . Electrocardiogram  02/07/2006  . Hx of nasal septal reconstruction for deviated septum  1980's  . Cataract extraction    . Eye surgery      History  Substance Use Topics  . Smoking status: Former Smoker    Quit date: 05/22/2001  . Smokeless tobacco: Not on file  . Alcohol Use: Yes     Comment: 1-2/week    Family History  Problem Relation Age of Onset  . Melanoma Mother   . Colon cancer Father     at advanced age  . Pancreatic cancer Brother   . Liver cancer Brother   . Arthritis Father   . Diabetes      grandmother  . Heart attack      grandfather  . Diabetes      grandfather    Allergies  Allergen Reactions  . Doxycycline     REACTION: swollen lips and throat  . Losartan Potassium     REACTION: ? of angioedema    Medication list has been reviewed and updated.  Current Outpatient Prescriptions on File Prior to Visit  Medication Sig Dispense Refill  . acyclovir (ZOVIRAX) 400 MG tablet TAKE 2 TABS BY MOUTH EVERY 4 HOURS WHILE AWAKE.  90 tablet  1  . amLODipine (NORVASC) 2.5 MG tablet Take 1 tablet (2.5 mg total) by mouth daily.  90 tablet  3  .  aspirin 81 MG tablet Take 81 mg by mouth daily.        . calcium carbonate (OS-CAL) 600 MG TABS Take 600 mg by mouth daily.        . cholecalciferol (VITAMIN D-400) 400 UNITS TABS Take 400 Units by mouth daily.        . fluocinonide-emollient (LIDEX-E) 0.05 % cream Apply 1 application topically 2 (two) times daily as needed.  30 g  0  . hydrOXYzine (ATARAX/VISTARIL) 25 MG tablet Take 1-2 tablets (25-50 mg total) by mouth every 6 (six) hours as needed for itching.  30 tablet  0  . loratadine-pseudoephedrine (CLARITIN-D 24-HOUR) 10-240 MG per 24 hr tablet Take 1 tablet by mouth daily as needed. For allergies       . omeprazole (PRILOSEC) 40 MG capsule Take 1 capsule (40 mg total) by mouth daily.  90 capsule  1  . zoledronic acid (RECLAST) 5 MG/100ML SOLN Inject 100 mLs (5 mg total) into  the vein once.  100 mL  0  . [DISCONTINUED] oxybutynin (DITROPAN) 5 MG tablet Take 1 tablet (5 mg total) by mouth 3 (three) times daily.  270 tablet  3   No current facility-administered medications on file prior to visit.    Review of Systems:  As per HPI, otherwise negative.    Physical Examination: Filed Vitals:   11/18/13 1241  BP: 128/90  Pulse: 79  Temp: 98.4 F (36.9 C)  Resp: 12   Filed Vitals:   11/18/13 1241  Height: 5\' 7"  (1.702 m)  Weight: 178 lb 6.4 oz (80.922 kg)   Body mass index is 27.93 kg/(m^2). Ideal Body Weight: Weight in (lb) to have BMI = 25: 159.3   GEN: WDWN, NAD, Non-toxic, Alert & Oriented x 3 HEENT: Atraumatic, Normocephalic.  Ears and Nose: No external deformity. EXTR: No clubbing/cyanosis/edema NEURO: Normal gait.  PSYCH: Normally interactive. Conversant. Not depressed or anxious appearing.  Calm demeanor.  SKIN:  Bulla right ankle.   Assessment and Plan: Debrided Follow up as needed  Signed,  Ellison Carwin, MD

## 2013-11-18 NOTE — Patient Instructions (Signed)

## 2013-11-28 ENCOUNTER — Other Ambulatory Visit: Payer: Self-pay | Admitting: Endocrinology

## 2013-12-10 ENCOUNTER — Other Ambulatory Visit: Payer: Self-pay

## 2013-12-10 DIAGNOSIS — Z1231 Encounter for screening mammogram for malignant neoplasm of breast: Secondary | ICD-10-CM

## 2013-12-26 ENCOUNTER — Telehealth: Payer: Self-pay | Admitting: Endocrinology

## 2013-12-26 NOTE — Telephone Encounter (Signed)
Spoke with pt's husband and advised that during her appointment the needed injections could be administered.

## 2013-12-26 NOTE — Telephone Encounter (Signed)
Patient would like to know if she could get the flu shot and the Prevnar injection on her sept 23rd appt. Please advise

## 2014-01-09 ENCOUNTER — Ambulatory Visit (INDEPENDENT_AMBULATORY_CARE_PROVIDER_SITE_OTHER): Payer: Medicare Other

## 2014-01-09 DIAGNOSIS — Z23 Encounter for immunization: Secondary | ICD-10-CM

## 2014-01-14 ENCOUNTER — Ambulatory Visit
Admission: RE | Admit: 2014-01-14 | Discharge: 2014-01-14 | Disposition: A | Discharge status: Home / Self Care | Payer: Medicare Other | Source: Ambulatory Visit

## 2014-01-14 DIAGNOSIS — Z1231 Encounter for screening mammogram for malignant neoplasm of breast: Secondary | ICD-10-CM

## 2014-02-22 DIAGNOSIS — L819 Disorder of pigmentation, unspecified: Secondary | ICD-10-CM | POA: Diagnosis not present

## 2014-02-22 DIAGNOSIS — L570 Actinic keratosis: Secondary | ICD-10-CM | POA: Diagnosis not present

## 2014-02-22 DIAGNOSIS — Z85828 Personal history of other malignant neoplasm of skin: Secondary | ICD-10-CM | POA: Diagnosis not present

## 2014-02-22 DIAGNOSIS — L438 Other lichen planus: Secondary | ICD-10-CM | POA: Diagnosis not present

## 2014-02-22 DIAGNOSIS — L821 Other seborrheic keratosis: Secondary | ICD-10-CM | POA: Diagnosis not present

## 2014-03-05 ENCOUNTER — Other Ambulatory Visit: Payer: Self-pay | Admitting: Endocrinology

## 2014-04-01 ENCOUNTER — Other Ambulatory Visit: Payer: Self-pay | Admitting: Endocrinology

## 2014-04-01 ENCOUNTER — Telehealth: Payer: Self-pay

## 2014-04-01 MED ORDER — OXYBUTYNIN CHLORIDE 5 MG PO TABS: 5.0000 mg | ORAL_TABLET | Freq: Three times a day (TID) | ORAL | Status: DC

## 2014-04-01 NOTE — Telephone Encounter (Signed)
Received a refill request for Oxybutynin 5 mg. Please advise if ok to refill. Rx is not on current medication list.  Please advise, Thanks!

## 2014-04-01 NOTE — Telephone Encounter (Signed)
Ok, i have sent a prescription to your pharmacy  

## 2014-04-18 ENCOUNTER — Other Ambulatory Visit: Payer: Self-pay | Admitting: Endocrinology

## 2014-05-02 DIAGNOSIS — H43813 Vitreous degeneration, bilateral: Secondary | ICD-10-CM | POA: Diagnosis not present

## 2014-05-02 DIAGNOSIS — Z961 Presence of intraocular lens: Secondary | ICD-10-CM | POA: Diagnosis not present

## 2014-05-02 DIAGNOSIS — H04123 Dry eye syndrome of bilateral lacrimal glands: Secondary | ICD-10-CM | POA: Diagnosis not present

## 2014-06-04 ENCOUNTER — Other Ambulatory Visit: Payer: Self-pay | Admitting: Endocrinology

## 2014-07-25 ENCOUNTER — Ambulatory Visit (INDEPENDENT_AMBULATORY_CARE_PROVIDER_SITE_OTHER): Payer: Medicare Other | Admitting: Family Medicine

## 2014-07-25 VITALS — BP 120/86 | HR 83 | Temp 97.6°F | Resp 16 | Ht 67.5 in | Wt 183.0 lb

## 2014-07-25 DIAGNOSIS — S30860A Insect bite (nonvenomous) of lower back and pelvis, initial encounter: Secondary | ICD-10-CM | POA: Diagnosis not present

## 2014-07-25 DIAGNOSIS — W57XXXA Bitten or stung by nonvenomous insect and other nonvenomous arthropods, initial encounter: Secondary | ICD-10-CM

## 2014-07-25 NOTE — Patient Instructions (Signed)
Apply the topical fluocinonide that you have at home to 3 times daily on the bites.  Inspect yourself carefully for any sign of tics always  If any flulike febrile illness or rash in the next 2-3 weeks please return promptly

## 2014-07-25 NOTE — Progress Notes (Signed)
Subjective patient was working in the yard a couple of days ago. She thinks she got a tick bite yesterday and it came on in to get that checked. She has several itchy places on her buttock.  Objective: At the midline sacral area of her low back is a tick, very small, not deeply embedded but is looped on strongly. This was plucked off using tweezers. The base was curetted to make sure nothing remained. Patient tolerated well. She has about for the red bumps that look like she may have gotten bit other spots. No other tics could be found.  Assessment: Tick bite  Plan: She has some hypodensity Cardizem cream at home. Told her to use that on the bites for a few days. She is allergic to doxycycline so did not give doxycycline prophylaxis, though cautioned her about what to watch for. If she got RMSF she would have to be treated with chloramphenicol since she is doxycycline allergic, so we have she does not get sick.

## 2014-07-26 ENCOUNTER — Encounter: Payer: Self-pay | Admitting: Internal Medicine

## 2014-08-27 ENCOUNTER — Other Ambulatory Visit: Payer: Self-pay | Admitting: Endocrinology

## 2014-08-27 NOTE — Telephone Encounter (Signed)
Rx sent for a 30 day supply.

## 2014-08-27 NOTE — Telephone Encounter (Signed)
Please advise if ok to refill. Pt has not been seen since December of 2014.  Thanks!

## 2014-08-27 NOTE — Telephone Encounter (Signed)
Please refill x 1 Ov is due  

## 2014-08-28 ENCOUNTER — Encounter: Payer: Self-pay | Admitting: Internal Medicine

## 2014-10-01 ENCOUNTER — Ambulatory Visit (AMBULATORY_SURGERY_CENTER): Payer: Self-pay

## 2014-10-01 VITALS — Ht 67.0 in | Wt 185.2 lb

## 2014-10-01 DIAGNOSIS — Z800 Family history of malignant neoplasm of digestive organs: Secondary | ICD-10-CM

## 2014-10-01 NOTE — Progress Notes (Signed)
Per pt, no allergies to soy or egg products.Pt not taking any weight loss meds or using  O2 at home. 

## 2014-10-16 ENCOUNTER — Encounter: Payer: Self-pay | Admitting: Internal Medicine

## 2014-10-16 ENCOUNTER — Ambulatory Visit (AMBULATORY_SURGERY_CENTER): Payer: Medicare Other | Admitting: Internal Medicine

## 2014-10-16 VITALS — BP 134/79 | HR 75 | Temp 97.6°F | Resp 14 | Ht 67.0 in | Wt 185.0 lb

## 2014-10-16 DIAGNOSIS — I10 Essential (primary) hypertension: Secondary | ICD-10-CM | POA: Diagnosis not present

## 2014-10-16 DIAGNOSIS — Z800 Family history of malignant neoplasm of digestive organs: Secondary | ICD-10-CM

## 2014-10-16 DIAGNOSIS — K219 Gastro-esophageal reflux disease without esophagitis: Secondary | ICD-10-CM

## 2014-10-16 DIAGNOSIS — K635 Polyp of colon: Secondary | ICD-10-CM | POA: Diagnosis not present

## 2014-10-16 DIAGNOSIS — Z1211 Encounter for screening for malignant neoplasm of colon: Secondary | ICD-10-CM

## 2014-10-16 DIAGNOSIS — D125 Benign neoplasm of sigmoid colon: Secondary | ICD-10-CM

## 2014-10-16 DIAGNOSIS — G4730 Sleep apnea, unspecified: Secondary | ICD-10-CM | POA: Diagnosis not present

## 2014-10-16 DIAGNOSIS — R569 Unspecified convulsions: Secondary | ICD-10-CM | POA: Diagnosis not present

## 2014-10-16 MED ORDER — SODIUM CHLORIDE 0.9 % IV SOLN: 500.0000 mL | INTRAVENOUS | Status: DC

## 2014-10-16 MED ORDER — OMEPRAZOLE 40 MG PO CPDR: 40.0000 mg | DELAYED_RELEASE_CAPSULE | Freq: Every day | ORAL | Status: DC

## 2014-10-16 NOTE — Patient Instructions (Signed)
YOU HAD AN ENDOSCOPIC PROCEDURE TODAY AT [facility]:   Refer to the procedure report that was given to you for any specific questions about what was found during the examination.  If the procedure report does not answer your questions, please call your gastroenterologist to clarify.  If you requested that your care partner not be given the details of your procedure findings, then the procedure report has been included in a sealed envelope for you to review at your convenience later.  YOU SHOULD EXPECT: Some feelings of bloating in the abdomen. Passage of more gas than usual.  Walking can help get rid of the air that was put into your GI tract during the procedure and reduce the bloating. If you had a lower endoscopy (such as a colonoscopy or flexible sigmoidoscopy) you may notice spotting of blood in your stool or on the toilet paper. If you underwent a bowel prep for your procedure, you may not have a normal bowel movement for a few days.  Please Note:  You might notice some irritation and congestion in your nose or some drainage.  This is from the oxygen used during your procedure.  There is no need for concern and it should clear up in a day or so.  SYMPTOMS TO REPORT IMMEDIATELY:   Following lower endoscopy (colonoscopy or flexible sigmoidoscopy):  Excessive amounts of blood in the stool  Significant tenderness or worsening of abdominal pains  Swelling of the abdomen that is new, acute  Fever of 100F or higher  For urgent or emergent issues, a gastroenterologist can be reached at any hour by calling [phone].   DIET: Your first meal following the procedure should be a small meal and then it is ok to progress to your normal diet. Heavy or fried foods are harder to digest and may make you feel nauseous or bloated.  Likewise, meals heavy in dairy and vegetables can increase bloating.  Drink plenty of fluids but you should avoid alcoholic beverages for 24  hours.  ACTIVITY:  You should plan to take it easy for the rest of today and you should NOT DRIVE or use heavy machinery until tomorrow (because of the sedation medicines used during the test).    FOLLOW UP: Our staff will call the number listed on your records the next business day following your procedure to check on you and address any questions or concerns that you may have regarding the information given to you following your procedure. If we do not reach you, we will leave a message.  However, if you are feeling well and you are not experiencing any problems, there is no need to return our call.  We will assume that you have returned to your regular daily activities without incident.  If any biopsies were taken you will be contacted by phone or by letter within the next 1-3 weeks.  Please call us at [phone] if you have not heard about the biopsies in 3 weeks.    SIGNATURES/CONFIDENTIALITY: You and/or your care partner have signed paperwork which will be entered into your electronic medical record.  These signatures attest to the fact that that the information above on your After Visit Summary has been reviewed and is understood.  Full responsibility of the confidentiality of this discharge information lies with you and/or your care-partner.  Polyps, diverticulosis, high fiber diet-handouts given  Repeat colonoscopy in 5 years 2021.

## 2014-10-16 NOTE — Op Note (Signed)
[place]  [address]. [place], [address]   COLONOSCOPY PROCEDURE REPORT  PATIENT: Jean Bowen, Jean Bowen  MR#: #[id number] BIRTHDATE: 12-07-1945 , 41  yrs. old GENDER: female ENDOSCOPIST: Lafayette Dragon, MD REFERRED KR:CVKF Rupert Stacks, M.D. PROCEDURE DATE:  10/16/2014 PROCEDURE:   Colonoscopy, screening and Colonoscopy with cold biopsy polypectomy First Screening Colonoscopy - Avg.  risk and is 50 yrs.  old or older - No.  Prior Negative Screening - Now for repeat screening. N/A  History of Adenoma - Now for follow-up colonoscopy & has been > or = to 3 yrs.  N/A  Polyps removed today? Yes ASA CLASS:   Class II INDICATIONS:Screening for colonic neoplasia, FH Colon or Rectal Adenocarcinoma, and father with colon cancer.  Hyperplastic polyp removed in 2003 and in May 2011. MEDICATIONS: Monitored anesthesia care and Propofol 300 mg IV  DESCRIPTION OF PROCEDURE:   After the risks benefits and alternatives of the procedure were thoroughly explained, informed consent was obtained.  The digital rectal exam revealed no abnormalities of the rectum.   The LB [id number]  endoscope was introduced through the anus and advanced to the cecum, which was identified by both the appendix and ileocecal valve. No adverse events experienced.   The quality of the prep was good.  (MoviPrep was used)  The instrument was then slowly withdrawn as the colon was fully examined. Estimated blood loss is zero unless otherwise noted in this procedure report.      COLON FINDINGS: A sessile polyp measuring 3 mm in size was found in the descending colon.  A polypectomy was performed with cold forceps.  The resection was complete, the polyp tissue was completely retrieved and sent to histology.   There was mild diverticulosis noted in the sigmoid colon with associated tortuosity.  Retroflexed views revealed no abnormalities. The time to cecum = 9.12 Withdrawal time = 9.30   The scope was  withdrawn and the procedure completed. COMPLICATIONS: There were no immediate complications.  ENDOSCOPIC IMPRESSION: 1.   Sessile polyp was found in the descending colon; polypectomy was performed with cold forceps 2.   There was mild diverticulosis noted in the sigmoid colon  RECOMMENDATIONS: 1.  Await pathology results 2.  High-fiber diet Recall colonoscopy in 5 years  eSigned:  Lafayette Dragon, MD 10/16/2014 3:27 PM   cc:   PATIENT NAME:  [name], [name] MR#: #[id number]

## 2014-10-16 NOTE — Progress Notes (Signed)
Called to room to assist during endoscopic procedure.  Patient ID and intended procedure confirmed with present staff. Received instructions for my participation in the procedure from the performing physician.  

## 2014-10-16 NOTE — Progress Notes (Signed)
Pt needed refill on omeprazole, asked Dr Olevia Perches if we could refill prescription, Dr Olevia Perches ok's refill 40 mg omperazole PO daily with 11 refills, sent to [facility]-adm

## 2014-10-16 NOTE — Progress Notes (Signed)
Patient awakening,vss,report to rn 

## 2014-10-17 ENCOUNTER — Other Ambulatory Visit: Payer: Self-pay | Admitting: *Deleted

## 2014-10-17 ENCOUNTER — Telehealth: Payer: Self-pay

## 2014-10-17 DIAGNOSIS — R1314 Dysphagia, pharyngoesophageal phase: Secondary | ICD-10-CM

## 2014-10-17 NOTE — Telephone Encounter (Signed)
  Follow up Call-  Call back number 10/16/2014  Post procedure Call Back phone  # [phone]  Permission to leave phone message Yes     Patient questions:  Do you have a fever, pain , or abdominal swelling? No. Pain Score  0 *  Have you tolerated food without any problems? Yes.    Have you been able to return to your normal activities? Yes.    Do you have any questions about your discharge instructions: Diet   No. Medications  No. Follow up visit  No.  Do you have questions or concerns about your Care? No.  Actions: * If pain score is 4 or above: No action needed, pain <4.

## 2014-10-18 ENCOUNTER — Ambulatory Visit (AMBULATORY_SURGERY_CENTER): Payer: Self-pay | Admitting: *Deleted

## 2014-10-18 VITALS — Ht 67.0 in | Wt 185.8 lb

## 2014-10-18 DIAGNOSIS — R1314 Dysphagia, pharyngoesophageal phase: Secondary | ICD-10-CM

## 2014-10-18 NOTE — Progress Notes (Signed)
No allergies to eggs or soy. No problems with anesthesia.  Pt given Emmi instructions for EGD  No oxygen use  No diet drug use  

## 2014-10-22 ENCOUNTER — Encounter: Payer: Self-pay | Admitting: Internal Medicine

## 2014-11-01 ENCOUNTER — Ambulatory Visit (AMBULATORY_SURGERY_CENTER): Payer: Medicare Other | Admitting: Internal Medicine

## 2014-11-01 ENCOUNTER — Encounter: Payer: Self-pay | Admitting: Internal Medicine

## 2014-11-01 VITALS — BP 132/85 | HR 73 | Temp 98.2°F | Resp 21 | Ht 67.0 in | Wt 185.0 lb

## 2014-11-01 DIAGNOSIS — I10 Essential (primary) hypertension: Secondary | ICD-10-CM | POA: Diagnosis not present

## 2014-11-01 DIAGNOSIS — R1310 Dysphagia, unspecified: Secondary | ICD-10-CM | POA: Diagnosis not present

## 2014-11-01 DIAGNOSIS — G4733 Obstructive sleep apnea (adult) (pediatric): Secondary | ICD-10-CM | POA: Diagnosis not present

## 2014-11-01 DIAGNOSIS — R1314 Dysphagia, pharyngoesophageal phase: Secondary | ICD-10-CM

## 2014-11-01 MED ORDER — SODIUM CHLORIDE 0.9 % IV SOLN: 500.0000 mL | INTRAVENOUS | Status: DC

## 2014-11-01 NOTE — Op Note (Signed)
[place]  [address]. [place], [address]   ENDOSCOPY PROCEDURE REPORT  PATIENT: Jean Bowen, Jean Bowen  MR#: #[id number] BIRTHDATE: 11-May-1945 , 66  yrs. old GENDER: female ENDOSCOPIST: Lafayette Dragon, MD REFERRED BY:  Donavan Foil, M.D. PROCEDURE DATE:  11/01/2014 PROCEDURE:  EGD w/ wire guided (savary) dilation ASA CLASS:     Class II INDICATIONS:  dysphagia and Prior colonoscopy September 2013. Dilated with 50 and 52 [place] Maloney dilators with complete relief of dysphagia.  Esophagram showed dysmotility.  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 MEDICATIONS: Monitored anesthesia care and Propofol 200 mg IV TOPICAL ANESTHETIC: none  DESCRIPTION OF PROCEDURE: After the risks benefits and alternatives of the procedure were thoroughly explained, informed consent was obtained.  The LB [id number] endoscope was introduced through the mouth and advanced to the second portion of the duodenum , Without limitations.  The instrument was slowly withdrawn as the mucosa was fully examined.      ESOPHAGUS: The mucosa of the esophagus appeared normal.,there was no stricture. Squamocolumnar junction appeared normal. Esophageal lumen was slightly wide but there was no retained food or secretions.  The esophagus was empirically dilated using a 50mm (51Fr) savary dilator over guidewire.  STOMACH: The mucosa of the stomach appeared normal. ,retroflexion of endoscope would be normal fundus and cardia. Gastric outlet was normal and patient had a prior gastric resection for spindle cell tumor. No evidence of scar tissue or deformity. There were a few scattered fundic gland polyps  DUODENUM: The duodenal mucosa showed no abnormalities in the bulb and 2nd part of the duodenum.   .   The scope was then withdrawn from the patient and the procedure completed.  COMPLICATIONS: There were no  immediate complications.  ENDOSCOPIC IMPRESSION: 1. Known esophageal dysmotility 2. Passage of 17 and 18 mm Savary dilator for recurrent solid food dysphagia 3. Remote  resection of spindle cell gastric tumor ,no evidence for recurrence  RECOMMENDATIONS: 1.  Anti-reflux regimen to be follow 2.  Resume regular diet.  If dysphagia continues it is due to esophageal dysmotility.  Last dilatation was successful in relieving dysphagia  REPEAT EXAM: for recall: no repeat exam necessary.  eSigned:  Lafayette Dragon, MD 11/01/2014 10:35 AM    CC:  PATIENT NAME:  Balinda, Heacock MR#: #[id number]

## 2014-11-01 NOTE — Patient Instructions (Signed)

## 2014-11-01 NOTE — Progress Notes (Signed)
Transferred to recovery room. A/O x3, pleased with MAC.  VSS.  Report to Wendy, RN. 

## 2014-11-01 NOTE — Progress Notes (Signed)
Called to room to assist during endoscopic procedure.  Patient ID and intended procedure confirmed with present staff. Received instructions for my participation in the procedure from the performing physician.  

## 2014-11-04 ENCOUNTER — Telehealth: Payer: Self-pay | Admitting: *Deleted

## 2014-11-04 NOTE — Telephone Encounter (Signed)
  Follow up Call-  Call back number 11/01/2014 10/16/2014  Post procedure Call Back phone  # [phone]  Permission to leave phone message Yes Yes     Patient questions:  Do you have a fever, pain , or abdominal swelling? No. Pain Score  0 *  Have you tolerated food without any problems? Yes.    Have you been able to return to your normal activities? Yes.    Do you have any questions about your discharge instructions: Diet   No. Medications  No. Follow up visit  No.  Do you have questions or concerns about your Care? No.  Actions: * If pain score is 4 or above: No action needed, pain <4.

## 2014-11-23 ENCOUNTER — Ambulatory Visit (INDEPENDENT_AMBULATORY_CARE_PROVIDER_SITE_OTHER): Payer: Medicare Other | Admitting: Physician Assistant

## 2014-11-23 VITALS — BP 132/80 | HR 63 | Temp 98.7°F | Ht 67.0 in | Wt 186.2 lb

## 2014-11-23 DIAGNOSIS — L282 Other prurigo: Secondary | ICD-10-CM

## 2014-11-23 MED ORDER — METHYLPREDNISOLONE ACETATE 80 MG/ML IJ SUSP
80.0000 mg | Freq: Once | INTRAMUSCULAR | Status: AC
  Administered 2014-11-23: 80 mg via INTRAMUSCULAR

## 2014-11-23 MED ORDER — PERMETHRIN 5 % EX CREA: 1.0000 "application " | TOPICAL_CREAM | Freq: Once | CUTANEOUS | Status: DC

## 2014-11-23 MED ORDER — HYDROXYZINE HCL 10 MG PO TABS: 10.0000 mg | ORAL_TABLET | Freq: Three times a day (TID) | ORAL | Status: DC | PRN

## 2014-11-23 MED ORDER — FLUOCINONIDE 0.05 % EX CREA: 1.0000 "application " | TOPICAL_CREAM | Freq: Two times a day (BID) | CUTANEOUS | Status: DC

## 2014-11-23 NOTE — Progress Notes (Signed)
11/24/2014 at 8:19 AM  [name] / DOB: 08-05-1945 / MRN: [id number]  The patient has HYPOTHYROIDISM; HYPERCHOLESTEROLEMIA; DEPRESSION; HYPERTENSION; GERD; DERMATITIS, PERIORAL; OSTEOPOROSIS; URINARY INCONTINENCE; ANGIOEDEMA; COLONIC POLYPS, HX OF; Dysphagia; Encounter for long-term (current) use of other medications; and Hearing loss on her problem list.  SUBJECTIVE  Chief complaint: Rash  Rodneshia Greenhouse is a well appearing 69 y.o. female presenting for bilateral pruritic rash that started roughly one week ago.  Patient reports multiple "bug bites." She has a new puppy and has been spending more time outside as a result.  She has not tried anything for her symptoms.    She  has a past medical history of URINARY INCONTINENCE (12/03/2006); OSTEOPOROSIS (12/03/2006); HYPOTHYROIDISM (12/03/2006); HYPERTENSION (12/03/2006); HYPERCHOLESTEROLEMIA (02/23/2010); GERD (03/03/2007); DEPRESSION (12/03/2006); Hyperplastic colon polyp (12/03/2006); ANGIOEDEMA (08/22/09); DERMATITIS, PERIORAL (01/08/2010); History of benign gastric tumor; OSA (obstructive sleep apnea); Hiatal hernia; Diverticulosis; and Sleep apnea.    Medications reviewed and updated by myself where necessary, and exist elsewhere in the encounter.   Ms. Shareef is allergic to doxycycline; losartan potassium; and morphine and related. She  reports that she quit smoking about 13 years ago. Her smoking use included Cigarettes. She has never used smokeless tobacco. She reports that she drinks about 1.8 oz of alcohol per week. She reports that she does not use illicit drugs. She  has no sexual activity history on file. The patient  has past surgical history that includes Cholecystectomy; Knee arthroscopy; Esophagogastroduodenoscopy (11/17/2006); Stress Cardiolite (02/14/2006); Tumor removal (2003); Skin cancer excision; Bunionectomy; electrocardiogram (02/07/2006); Hx of nasal septal reconstruction for deviated septum (1980's); and Cataract extraction.  Her  family history includes Arthritis in her father; Colon cancer in her father; Diabetes in an other family member; Heart attack in an other family member; Liver cancer in her brother; Melanoma in her mother; Pancreatic cancer in her brother.  Review of Systems  Constitutional: Negative for fever.  Respiratory: Negative for cough.   Cardiovascular: Negative for chest pain.  Gastrointestinal: Positive for nausea.  Skin: Positive for itching and rash.  Neurological: Negative for dizziness and headaches.    OBJECTIVE  Her  height is 5\' 7"  (1.702 m) and weight is 186 lb 3.2 oz (84.46 kg). Her oral temperature is 98.7 F (37.1 C). Her blood pressure is 132/80 and her pulse is 63. Her oxygen saturation is 98%.  The patient's body mass index is 29.16 kg/(m^2).  Physical Exam  Constitutional: She is oriented to person, place, and time. She appears well-developed and well-nourished. No distress.  Eyes: Conjunctivae and EOM are normal. Pupils are equal, round, and reactive to light.  Neck: No thyromegaly present.  Cardiovascular: Normal rate.   Respiratory: Effort normal.  GI: She exhibits no distension.  Musculoskeletal: Normal range of motion.  Neurological: She is alert and oriented to person, place, and time. She has normal reflexes.  Skin: Skin is warm and dry. She is not diaphoretic.       No results found for this or any previous visit (from the past 24 hour(s)).  ASSESSMENT & PLAN  Lendora was seen today for rash.  Diagnoses and all orders for this visit:  Pruritic rash Orders: -     hydrOXYzine (ATARAX/VISTARIL) 10 MG tablet; Take 1 tablet (10 mg total) by mouth 3 (three) times daily as needed. -     fluocinonide cream (LIDEX) 0.05 %; Apply 1 application topically 2 (two) times daily. -     methylPREDNISolone acetate (DEPO-MEDROL) injection 80 mg; Inject 1 mL (  80 mg total) into the muscle once. -     permethrin (ACTICIN) 5 % cream; Apply 1 application topically once.   The  patient was advised to call or come back to clinic if she does not see an improvement in symptoms, or worsens with the above plan.   Philis Fendt, MHS, PA-C [facility] 11/24/2014 8:19 AM

## 2014-11-28 ENCOUNTER — Other Ambulatory Visit: Payer: Self-pay | Admitting: Physician Assistant

## 2014-11-29 NOTE — Telephone Encounter (Signed)
Do you want to give pt RF or RTC?

## 2015-01-09 ENCOUNTER — Encounter: Payer: Self-pay | Admitting: Internal Medicine

## 2015-01-17 ENCOUNTER — Other Ambulatory Visit: Payer: Self-pay | Admitting: Endocrinology

## 2015-01-17 NOTE — Telephone Encounter (Signed)
Please refill x 1 cpx is due 

## 2015-01-17 NOTE — Telephone Encounter (Signed)
Please advise if ok to refill. Last office visit was 03/20/2013. Thanks!

## 2015-02-11 ENCOUNTER — Ambulatory Visit (INDEPENDENT_AMBULATORY_CARE_PROVIDER_SITE_OTHER): Payer: Medicare Other | Admitting: Endocrinology

## 2015-02-11 ENCOUNTER — Encounter: Payer: Self-pay | Admitting: Endocrinology

## 2015-02-11 VITALS — BP 137/86 | HR 76 | Temp 97.6°F | Ht 67.0 in | Wt 179.0 lb

## 2015-02-11 DIAGNOSIS — E039 Hypothyroidism, unspecified: Secondary | ICD-10-CM

## 2015-02-11 DIAGNOSIS — Z0000 Encounter for general adult medical examination without abnormal findings: Secondary | ICD-10-CM

## 2015-02-11 DIAGNOSIS — K219 Gastro-esophageal reflux disease without esophagitis: Secondary | ICD-10-CM

## 2015-02-11 DIAGNOSIS — E785 Hyperlipidemia, unspecified: Secondary | ICD-10-CM

## 2015-02-11 DIAGNOSIS — E7800 Pure hypercholesterolemia, unspecified: Secondary | ICD-10-CM

## 2015-02-11 DIAGNOSIS — Z23 Encounter for immunization: Secondary | ICD-10-CM | POA: Diagnosis not present

## 2015-02-11 DIAGNOSIS — I10 Essential (primary) hypertension: Secondary | ICD-10-CM

## 2015-02-11 DIAGNOSIS — E213 Hyperparathyroidism, unspecified: Secondary | ICD-10-CM

## 2015-02-11 DIAGNOSIS — M810 Age-related osteoporosis without current pathological fracture: Secondary | ICD-10-CM | POA: Diagnosis not present

## 2015-02-11 DIAGNOSIS — D696 Thrombocytopenia, unspecified: Secondary | ICD-10-CM

## 2015-02-11 LAB — HEPATIC FUNCTION PANEL
ALK PHOS: 58 U/L (ref 39–117)
ALT: 25 U/L (ref 0–35)
AST: 20 U/L (ref 0–37)
Albumin: 4.2 g/dL (ref 3.5–5.2)
BILIRUBIN DIRECT: 0.1 mg/dL (ref 0.0–0.3)
BILIRUBIN TOTAL: 0.4 mg/dL (ref 0.2–1.2)
Total Protein: 7.1 g/dL (ref 6.0–8.3)

## 2015-02-11 LAB — CBC WITH DIFFERENTIAL/PLATELET
BASOS ABS: 0 10*3/uL (ref 0.0–0.1)
BASOS PCT: 0.3 % (ref 0.0–3.0)
EOS PCT: 2.3 % (ref 0.0–5.0)
Eosinophils Absolute: 0.1 10*3/uL (ref 0.0–0.7)
HEMATOCRIT: 42.2 % (ref 36.0–46.0)
Hemoglobin: 14.1 g/dL (ref 12.0–15.0)
LYMPHS ABS: 1.6 10*3/uL (ref 0.7–4.0)
LYMPHS PCT: 27.1 % (ref 12.0–46.0)
MCHC: 33.4 g/dL (ref 30.0–36.0)
MCV: 91.5 fl (ref 78.0–100.0)
MONOS PCT: 11.4 % (ref 3.0–12.0)
Monocytes Absolute: 0.7 10*3/uL (ref 0.1–1.0)
NEUTROS ABS: 3.4 10*3/uL (ref 1.4–7.7)
NEUTROS PCT: 58.9 % (ref 43.0–77.0)
PLATELETS: 114 10*3/uL — AB (ref 150.0–400.0)
RBC: 4.61 Mil/uL (ref 3.87–5.11)
RDW: 14.5 % (ref 11.5–15.5)
WBC: 5.8 10*3/uL (ref 4.0–10.5)

## 2015-02-11 LAB — BASIC METABOLIC PANEL
BUN: 15 mg/dL (ref 6–23)
CHLORIDE: 102 meq/L (ref 96–112)
CO2: 30 meq/L (ref 19–32)
CREATININE: 0.6 mg/dL (ref 0.40–1.20)
Calcium: 9.3 mg/dL (ref 8.4–10.5)
GFR: 105.06 mL/min (ref >60.00)
Glucose, Bld: 98 mg/dL (ref 70–99)
Potassium: 4.2 mEq/L (ref 3.5–5.1)
Sodium: 140 mEq/L (ref 135–145)

## 2015-02-11 LAB — URINALYSIS, ROUTINE W REFLEX MICROSCOPIC
BILIRUBIN URINE: NEGATIVE
Ketones, ur: NEGATIVE
LEUKOCYTES UA: NEGATIVE
Nitrite: NEGATIVE
PH: 5.5 (ref 5.0–8.0)
Specific Gravity, Urine: 1.025 (ref 1.000–1.030)
Total Protein, Urine: NEGATIVE
Urine Glucose: NEGATIVE
Urobilinogen, UA: 0.2 (ref 0.0–1.0)

## 2015-02-11 LAB — LIPID PANEL
CHOL/HDL RATIO: 5
CHOLESTEROL: 227 mg/dL — AB (ref 0–200)
HDL: 42.1 mg/dL (ref >39.00)
NonHDL: 184.74
TRIGLYCERIDES: 336 mg/dL — AB (ref 0.0–149.0)
VLDL: 67.2 mg/dL — ABNORMAL HIGH (ref 0.0–40.0)

## 2015-02-11 LAB — TSH: TSH: 1.58 u[IU]/mL (ref 0.35–4.50)

## 2015-02-11 LAB — LDL CHOLESTEROL, DIRECT: Direct LDL: 67 mg/dL

## 2015-02-11 MED ORDER — AMLODIPINE BESYLATE 2.5 MG PO TABS: ORAL_TABLET | ORAL | Status: DC

## 2015-02-11 MED ORDER — ACYCLOVIR 400 MG PO TABS: ORAL_TABLET | ORAL | Status: DC

## 2015-02-11 MED ORDER — ESCITALOPRAM OXALATE 10 MG PO TABS: 10.0000 mg | ORAL_TABLET | Freq: Every day | ORAL | Status: DC

## 2015-02-11 MED ORDER — OXYBUTYNIN CHLORIDE 5 MG PO TABS: 5.0000 mg | ORAL_TABLET | Freq: Three times a day (TID) | ORAL | Status: AC

## 2015-02-11 NOTE — Progress Notes (Signed)
we discussed code status.  pt requests full code, but would not want to be started or maintained on artificial life-support measures if there was not a reasonable chance of recovery 

## 2015-02-11 NOTE — Progress Notes (Signed)
Subjective:    Patient ID: Jean Bowen, female    DOB: [birth year], 69 y.o.   MRN: [id number]  HPI The state of at least three ongoing medical problems is addressed today, with interval history of each noted here: Osteoporosis: she took fosamax for approx 3 years.  She then took reclast injections approx 2-3 times.  Denies low back pain Dyslipidemia: she denies chest pain. HTN: she denies sob Past Medical History  Diagnosis Date  . URINARY INCONTINENCE 12/03/2006  . OSTEOPOROSIS 12/03/2006  . HYPOTHYROIDISM 12/03/2006  . HYPERTENSION 12/03/2006  . HYPERCHOLESTEROLEMIA 02/23/2010  . GERD 03/03/2007  . DEPRESSION 12/03/2006  . Hyperplastic colon polyp 12/03/2006  . ANGIOEDEMA 08/22/09  . DERMATITIS, PERIORAL 01/08/2010  . History of benign gastric tumor     significant for a benign gastric tumor that weighed approximately 5 pounds and was resected.  . OSA (obstructive sleep apnea)   . Hiatal hernia   . Diverticulosis   . Sleep apnea     Past Surgical History  Procedure Laterality Date  . Cholecystectomy    . Knee arthroscopy      Bilateral-torn meniscus  . Esophagogastroduodenoscopy  11/17/2006  . Stress cardiolite  02/14/2006  . Tumor removal  2003    removal 5 lb benign tumor from abdomen  . 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 Bunionectomy      both feet  . Electrocardiogram  02/07/2006  . Hx of nasal septal reconstruction for deviated septum  1980's  . Cataract extraction      Bil    Social History   Social History  . Marital Status: Married    Spouse Name: N/A  . Number of Children: N/A  . Years of Education: N/A   Occupational History  . 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 Smoking status: Former Smoker    Types: Cigarettes    Quit date: 05/22/2001  . Smokeless tobacco: Never Used  . Alcohol Use: 1.8 oz/week    3 Glasses of wine per week     Comment: 1-2/week  . Drug Use: No  .  Sexual Activity: Not on file   Other Topics Concern  . Not on file   Social History Narrative   1 dog   Regular exercise-yes          Current Outpatient Prescriptions on File Prior to Visit  Medication Sig Dispense Refill  . acetaminophen (TYLENOL) 500 MG tablet Take 500 mg by mouth as needed.    Marland Kitchen aspirin 81 MG tablet Take 81 mg by mouth daily.      . cetirizine (ZYRTEC) 10 MG tablet Take 10 mg by mouth as needed.     . fluocinonide cream (LIDEX) 6.76 % APPLY 1 APPLICATION TOPICALLY 2 (TWO) TIMES DAILY. 30 g 3  . hydrOXYzine (ATARAX/VISTARIL) 10 MG tablet Take 1 tablet (10 mg total) by mouth 3 (three) times daily as needed. 30 tablet 0  . omeprazole (PRILOSEC) 40 MG capsule Take 1 capsule (40 mg total) by mouth daily. 30 capsule 11  . permethrin (ACTICIN) 5 % cream Apply 1 application topically once. 60 g 1  . calcium carbonate (OS-CAL) 600 MG TABS Take 600 mg by mouth daily.      . cholecalciferol (VITAMIN D-400) 400 UNITS TABS Take 400 Units by mouth daily.       No current facility-administered medications on file prior to visit.  Allergies  Allergen Reactions  . Doxycycline     REACTION: swollen lips and throat  . Losartan Potassium     REACTION: ? of angioedema  . Morphine And Related     Caused vomiting when given too fast in IV.    Family History  Problem Relation Age of Onset  . Melanoma Mother   . Colon cancer Father     at advanced age  . Arthritis Father   . Pancreatic cancer Brother   . Liver cancer Brother   . Heart attack      grandfather  . Diabetes      grandfather/grandmother    BP 137/86 mmHg  Pulse 76  Temp(Src) 97.6 F (36.4 C) (Oral)  Ht 5\' 7"  (1.702 m)  Wt 179 lb (81.194 kg)  BMI 28.03 kg/m2  SpO2 94%  Review of Systems She has gained a few lbs.  Denies edema    Objective:   Physical Exam VS: see vs page GEN: no distress HEAD: head: no deformity eyes: no periorbital swelling, no proptosis external nose and ears are  normal mouth: no lesion seen NECK: supple, thyroid is not enlarged CHEST WALL: no deformity LUNGS:  Clear to auscultation CV: reg rate and rhythm, no murmur MUSCULOSKELETAL: muscle bulk and strength are grossly normal.  no obvious joint swelling.  gait is normal and steady EXTEMITIES: no deformity.  no ulcer on the feet.  feet are of normal color and temp.  no edema.  There is bilateral onychomycosis of the toenails.  PULSES: dorsalis pedis intact bilat.  no carotid bruit NEURO:  cn 2-12 grossly intact.   readily moves all 4's.  sensation is intact to touch on the feet SKIN:  Normal texture and temperature.  No rash or suspicious lesion is visible.   NODES:  None palpable at the neck PSYCH: alert, well-oriented.  Does not appear anxious nor depressed.   i personally reviewed electrocardiogram tracing (today): Indication:  Impression:  Lab Results  Component Value Date   WBC 5.8 02/11/2015   HGB 14.1 02/11/2015   HCT 42.2 02/11/2015   PLT 114.0* 02/11/2015   GLUCOSE 98 02/11/2015   CHOL 227* 02/11/2015   TRIG 336.0* 02/11/2015   HDL 42.10 02/11/2015   LDLDIRECT 67.0 02/11/2015   LDLCALC 121* 03/06/2012   ALT 25 02/11/2015   AST 20 02/11/2015   NA 140 02/11/2015   K 4.2 02/11/2015   CL 102 02/11/2015   CREATININE 0.60 02/11/2015   BUN 15 02/11/2015   CO2 30 02/11/2015   TSH 1.58 02/11/2015   HGBA1C 5.9 03/05/2009      Assessment & Plan:  Thrombocytopenia, new, uncertain etiology.  All you need is to have this rechecked in 1-2 weeks. HTN: well-controlled.  Please continue the same medication. Dyslipidemia: i offered medication.   Osteoporosis: recheck dexa   Subjective:   Patient here for Medicare annual wellness visit and management of other chronic and acute problems.     Risk factors: advanced age    40 of Physicians Providing Medical Care to Patient:  See "snapshot"   Activities of Daily Living: In your present state of health, do you have any  difficulty performing the following activities?:  Preparing food and eating?: No  Bathing yourself: No  Getting dressed: No  Using the toilet:No  Moving around from place to place: No  In the past year have you fallen or had a near fall?:No    Home Safety: Has smoke detector and wears seat  belts. Firearms are safely stored. No excess sun exposure.  Diet and Exercise  Current exercise habits: pt says good Dietary issues discussed: pt reports a healthy diet   Depression Screen  Q1: Over the past two weeks, have you felt down, depressed or hopeless? Mild depression (no EtOH, domestic violence, SI) Q2: Over the past two weeks, have you felt little interest or pleasure in doing things? no   The following portions of the patient's history were reviewed and updated as appropriate: allergies, current medications, past family history, past medical history, past social history, past surgical history and problem list.   Review of Systems  Denies hearing loss, and visual loss Objective:   Vision:  Sees opthalmologist Hearing: grossly normal Body mass index:  See vs page.   Msk: pt easily and quickly performs "get-up-and-go" from a sitting position Cognitive Impairment Assessment: cognition, memory and judgment appear normal.  remembers 3/3 at 5 minutes.  excellent recall.  can easily read and write a sentence.  alert and oriented x 3.     Assessment:   Medicare wellness utd on preventive parameters.     Plan:   During the course of the visit the patient was educated and counseled about appropriate screening and preventive services including:        Fall prevention   Screening mammography  Bone densitometry screening  Diabetes screening  Nutrition counseling   Vaccines / LABS Zostavax / Pneumococcal Vaccine  today   Patient Instructions (the written plan) was given to the patient.

## 2015-02-11 NOTE — Patient Instructions (Addendum)
blood tests are requested for you today.  We'll let you know about the results. please consider these measures for your health:  minimize alcohol.  do not use tobacco products.  have a colonoscopy at least every 10 years from age 70.  Women should have an annual mammogram from age 79.  keep firearms safely stored.  always use seat belts.  have working smoke alarms in your home.  see an eye doctor and dentist regularly.  never drive under the influence of alcohol or drugs (including prescription drugs).  those with fair skin should take precautions against the sun.   it is critically important to prevent falling down (keep floor areas well-lit, dry, and free of loose objects.  If you have a cane, walker, or wheelchair, you should use it, even for short trips around the house.  Also, try not to rush).   good diet and exercise significantly improve your health.  please let me know if you wish to be referred to a dietician.   i have sent a prescription to your pharmacy, for a pill against depression Please return in 1 year.

## 2015-02-12 DIAGNOSIS — E213 Hyperparathyroidism, unspecified: Secondary | ICD-10-CM | POA: Insufficient documentation

## 2015-02-12 LAB — PTH, INTACT AND CALCIUM
Calcium: 8.8 mg/dL (ref 8.4–10.5)
PTH: 93 pg/mL — ABNORMAL HIGH (ref 14–64)

## 2015-02-13 ENCOUNTER — Telehealth: Payer: Self-pay | Admitting: Endocrinology

## 2015-02-13 NOTE — Telephone Encounter (Signed)
please call patient: As your cholesterol was slightly high, do you want a prescription for medication for this?

## 2015-02-14 MED ORDER — ATORVASTATIN CALCIUM 10 MG PO TABS: 10.0000 mg | ORAL_TABLET | Freq: Every day | ORAL | Status: DC

## 2015-02-14 NOTE — Telephone Encounter (Signed)
Ok, i have sent a prescription to your pharmacy  

## 2015-02-14 NOTE — Telephone Encounter (Signed)
I contacted the patient and advised of note below. Patient agreed to starting cholesterol medication.

## 2015-02-14 NOTE — Telephone Encounter (Signed)
Pt advised rx has been sent.  

## 2015-02-25 ENCOUNTER — Ambulatory Visit (INDEPENDENT_AMBULATORY_CARE_PROVIDER_SITE_OTHER)
Admission: RE | Admit: 2015-02-25 | Discharge: 2015-02-25 | Disposition: A | Discharge status: Home / Self Care | Payer: Medicare Other | Source: Ambulatory Visit | Attending: Endocrinology | Admitting: Endocrinology

## 2015-02-25 DIAGNOSIS — D696 Thrombocytopenia, unspecified: Secondary | ICD-10-CM

## 2015-02-25 DIAGNOSIS — K219 Gastro-esophageal reflux disease without esophagitis: Secondary | ICD-10-CM

## 2015-02-25 DIAGNOSIS — E7800 Pure hypercholesterolemia, unspecified: Secondary | ICD-10-CM

## 2015-02-25 DIAGNOSIS — E213 Hyperparathyroidism, unspecified: Secondary | ICD-10-CM

## 2015-02-25 DIAGNOSIS — I10 Essential (primary) hypertension: Secondary | ICD-10-CM

## 2015-02-25 DIAGNOSIS — M810 Age-related osteoporosis without current pathological fracture: Secondary | ICD-10-CM | POA: Diagnosis not present

## 2015-02-25 DIAGNOSIS — Z23 Encounter for immunization: Secondary | ICD-10-CM

## 2015-02-25 DIAGNOSIS — E039 Hypothyroidism, unspecified: Secondary | ICD-10-CM

## 2015-02-25 DIAGNOSIS — Z0000 Encounter for general adult medical examination without abnormal findings: Secondary | ICD-10-CM

## 2015-02-26 ENCOUNTER — Other Ambulatory Visit: Payer: Self-pay

## 2015-02-26 ENCOUNTER — Other Ambulatory Visit (INDEPENDENT_AMBULATORY_CARE_PROVIDER_SITE_OTHER): Payer: Medicare Other

## 2015-02-26 DIAGNOSIS — M810 Age-related osteoporosis without current pathological fracture: Secondary | ICD-10-CM

## 2015-02-26 DIAGNOSIS — D696 Thrombocytopenia, unspecified: Secondary | ICD-10-CM

## 2015-02-26 DIAGNOSIS — Z23 Encounter for immunization: Secondary | ICD-10-CM | POA: Diagnosis not present

## 2015-02-26 DIAGNOSIS — E039 Hypothyroidism, unspecified: Secondary | ICD-10-CM

## 2015-02-26 DIAGNOSIS — E213 Hyperparathyroidism, unspecified: Secondary | ICD-10-CM | POA: Diagnosis not present

## 2015-02-26 DIAGNOSIS — E7800 Pure hypercholesterolemia, unspecified: Secondary | ICD-10-CM

## 2015-02-26 DIAGNOSIS — Z0000 Encounter for general adult medical examination without abnormal findings: Secondary | ICD-10-CM | POA: Diagnosis not present

## 2015-02-26 DIAGNOSIS — I10 Essential (primary) hypertension: Secondary | ICD-10-CM

## 2015-02-26 DIAGNOSIS — Z1231 Encounter for screening mammogram for malignant neoplasm of breast: Secondary | ICD-10-CM

## 2015-02-26 DIAGNOSIS — K219 Gastro-esophageal reflux disease without esophagitis: Secondary | ICD-10-CM

## 2015-02-26 LAB — CBC WITH DIFFERENTIAL/PLATELET
BASOS PCT: 0.4 % (ref 0.0–3.0)
Basophils Absolute: 0 10*3/uL (ref 0.0–0.1)
EOS ABS: 0.1 10*3/uL (ref 0.0–0.7)
Eosinophils Relative: 2.1 % (ref 0.0–5.0)
HCT: 43.2 % (ref 36.0–46.0)
Hemoglobin: 14.3 g/dL (ref 12.0–15.0)
Lymphocytes Relative: 27.4 % (ref 12.0–46.0)
Lymphs Abs: 1.6 10*3/uL (ref 0.7–4.0)
MCHC: 33.1 g/dL (ref 30.0–36.0)
MCV: 92 fl (ref 78.0–100.0)
Monocytes Absolute: 0.7 10*3/uL (ref 0.1–1.0)
Monocytes Relative: 11.7 % (ref 3.0–12.0)
NEUTROS ABS: 3.5 10*3/uL (ref 1.4–7.7)
Neutrophils Relative %: 58.4 % (ref 43.0–77.0)
PLATELETS: 251 10*3/uL (ref 150.0–400.0)
RBC: 4.69 Mil/uL (ref 3.87–5.11)
RDW: 14.6 % (ref 11.5–15.5)
WBC: 6 10*3/uL (ref 4.0–10.5)

## 2015-02-26 LAB — VITAMIN D 25 HYDROXY (VIT D DEFICIENCY, FRACTURES): VITD: 19.66 ng/mL — ABNORMAL LOW (ref 30.00–100.00)

## 2015-03-03 DIAGNOSIS — L820 Inflamed seborrheic keratosis: Secondary | ICD-10-CM | POA: Diagnosis not present

## 2015-03-03 DIAGNOSIS — D225 Melanocytic nevi of trunk: Secondary | ICD-10-CM | POA: Diagnosis not present

## 2015-03-03 DIAGNOSIS — D1801 Hemangioma of skin and subcutaneous tissue: Secondary | ICD-10-CM | POA: Diagnosis not present

## 2015-03-03 DIAGNOSIS — D234 Other benign neoplasm of skin of scalp and neck: Secondary | ICD-10-CM | POA: Diagnosis not present

## 2015-03-03 DIAGNOSIS — D485 Neoplasm of uncertain behavior of skin: Secondary | ICD-10-CM | POA: Diagnosis not present

## 2015-03-03 DIAGNOSIS — Z85828 Personal history of other malignant neoplasm of skin: Secondary | ICD-10-CM | POA: Diagnosis not present

## 2015-03-03 DIAGNOSIS — L570 Actinic keratosis: Secondary | ICD-10-CM | POA: Diagnosis not present

## 2015-03-03 DIAGNOSIS — D224 Melanocytic nevi of scalp and neck: Secondary | ICD-10-CM | POA: Diagnosis not present

## 2015-03-03 DIAGNOSIS — L821 Other seborrheic keratosis: Secondary | ICD-10-CM | POA: Diagnosis not present

## 2015-03-03 DIAGNOSIS — L853 Xerosis cutis: Secondary | ICD-10-CM | POA: Diagnosis not present

## 2015-03-19 ENCOUNTER — Encounter (HOSPITAL_COMMUNITY): Payer: Self-pay | Admitting: Emergency Medicine

## 2015-03-19 ENCOUNTER — Emergency Department (HOSPITAL_COMMUNITY): Payer: Medicare Other

## 2015-03-19 ENCOUNTER — Inpatient Hospital Stay (HOSPITAL_COMMUNITY)
Admission: EM | Admit: 2015-03-19 | Discharge: 2015-03-21 | DRG: 084 | Disposition: A | Discharge status: Home / Self Care | Payer: Medicare Other | Attending: Internal Medicine | Admitting: Internal Medicine

## 2015-03-19 DIAGNOSIS — W1830XA Fall on same level, unspecified, initial encounter: Secondary | ICD-10-CM | POA: Diagnosis present

## 2015-03-19 DIAGNOSIS — Z808 Family history of malignant neoplasm of other organs or systems: Secondary | ICD-10-CM

## 2015-03-19 DIAGNOSIS — R413 Other amnesia: Secondary | ICD-10-CM | POA: Diagnosis present

## 2015-03-19 DIAGNOSIS — R55 Syncope and collapse: Secondary | ICD-10-CM | POA: Diagnosis present

## 2015-03-19 DIAGNOSIS — M810 Age-related osteoporosis without current pathological fracture: Secondary | ICD-10-CM | POA: Diagnosis present

## 2015-03-19 DIAGNOSIS — R32 Unspecified urinary incontinence: Secondary | ICD-10-CM | POA: Diagnosis present

## 2015-03-19 DIAGNOSIS — Z7982 Long term (current) use of aspirin: Secondary | ICD-10-CM

## 2015-03-19 DIAGNOSIS — Z881 Allergy status to other antibiotic agents status: Secondary | ICD-10-CM

## 2015-03-19 DIAGNOSIS — Z833 Family history of diabetes mellitus: Secondary | ICD-10-CM

## 2015-03-19 DIAGNOSIS — S06369A Traumatic hemorrhage of cerebrum, unspecified, with loss of consciousness of unspecified duration, initial encounter: Secondary | ICD-10-CM | POA: Diagnosis not present

## 2015-03-19 DIAGNOSIS — E785 Hyperlipidemia, unspecified: Secondary | ICD-10-CM | POA: Diagnosis not present

## 2015-03-19 DIAGNOSIS — Z85828 Personal history of other malignant neoplasm of skin: Secondary | ICD-10-CM

## 2015-03-19 DIAGNOSIS — W19XXXA Unspecified fall, initial encounter: Secondary | ICD-10-CM

## 2015-03-19 DIAGNOSIS — M1990 Unspecified osteoarthritis, unspecified site: Secondary | ICD-10-CM | POA: Diagnosis present

## 2015-03-19 DIAGNOSIS — G4733 Obstructive sleep apnea (adult) (pediatric): Secondary | ICD-10-CM | POA: Diagnosis present

## 2015-03-19 DIAGNOSIS — K219 Gastro-esophageal reflux disease without esophagitis: Secondary | ICD-10-CM | POA: Diagnosis present

## 2015-03-19 DIAGNOSIS — Z8249 Family history of ischemic heart disease and other diseases of the circulatory system: Secondary | ICD-10-CM

## 2015-03-19 DIAGNOSIS — Z800 Family history of malignant neoplasm of digestive organs: Secondary | ICD-10-CM

## 2015-03-19 DIAGNOSIS — D72828 Other elevated white blood cell count: Secondary | ICD-10-CM | POA: Diagnosis present

## 2015-03-19 DIAGNOSIS — I10 Essential (primary) hypertension: Secondary | ICD-10-CM | POA: Diagnosis present

## 2015-03-19 DIAGNOSIS — S0636AA Traumatic hemorrhage of cerebrum, unspecified, with loss of consciousness status unknown, initial encounter: Secondary | ICD-10-CM | POA: Diagnosis present

## 2015-03-19 DIAGNOSIS — Y93K1 Activity, walking an animal: Secondary | ICD-10-CM

## 2015-03-19 DIAGNOSIS — I739 Peripheral vascular disease, unspecified: Secondary | ICD-10-CM | POA: Diagnosis present

## 2015-03-19 DIAGNOSIS — Z86010 Personal history of colonic polyps: Secondary | ICD-10-CM

## 2015-03-19 DIAGNOSIS — S4991XA Unspecified injury of right shoulder and upper arm, initial encounter: Secondary | ICD-10-CM | POA: Diagnosis not present

## 2015-03-19 DIAGNOSIS — S0101XA Laceration without foreign body of scalp, initial encounter: Secondary | ICD-10-CM | POA: Diagnosis not present

## 2015-03-19 DIAGNOSIS — Z79899 Other long term (current) drug therapy: Secondary | ICD-10-CM

## 2015-03-19 DIAGNOSIS — N3281 Overactive bladder: Secondary | ICD-10-CM | POA: Diagnosis present

## 2015-03-19 DIAGNOSIS — E7800 Pure hypercholesterolemia, unspecified: Secondary | ICD-10-CM | POA: Diagnosis present

## 2015-03-19 DIAGNOSIS — Z87891 Personal history of nicotine dependence: Secondary | ICD-10-CM

## 2015-03-19 DIAGNOSIS — Z801 Family history of malignant neoplasm of trachea, bronchus and lung: Secondary | ICD-10-CM

## 2015-03-19 DIAGNOSIS — Z91018 Allergy to other foods: Secondary | ICD-10-CM

## 2015-03-19 DIAGNOSIS — D72829 Elevated white blood cell count, unspecified: Secondary | ICD-10-CM | POA: Diagnosis present

## 2015-03-19 DIAGNOSIS — Z888 Allergy status to other drugs, medicaments and biological substances status: Secondary | ICD-10-CM

## 2015-03-19 DIAGNOSIS — S06360A Traumatic hemorrhage of cerebrum, unspecified, without loss of consciousness, initial encounter: Secondary | ICD-10-CM | POA: Diagnosis not present

## 2015-03-19 DIAGNOSIS — S199XXA Unspecified injury of neck, initial encounter: Secondary | ICD-10-CM | POA: Diagnosis not present

## 2015-03-19 LAB — CBC WITH DIFFERENTIAL/PLATELET
BASOS ABS: 0 10*3/uL (ref 0.0–0.1)
BASOS PCT: 0 %
EOS ABS: 0.1 10*3/uL (ref 0.0–0.7)
EOS PCT: 1 %
HCT: 40.9 % (ref 36.0–46.0)
Hemoglobin: 13.5 g/dL (ref 12.0–15.0)
Lymphocytes Relative: 13 %
Lymphs Abs: 1.9 10*3/uL (ref 0.7–4.0)
MCH: 30.9 pg (ref 26.0–34.0)
MCHC: 33 g/dL (ref 30.0–36.0)
MCV: 93.6 fL (ref 78.0–100.0)
MONO ABS: 1.3 10*3/uL — AB (ref 0.1–1.0)
MONOS PCT: 9 %
NEUTROS ABS: 10.8 10*3/uL — AB (ref 1.7–7.7)
Neutrophils Relative %: 77 %
PLATELETS: 225 10*3/uL (ref 150–400)
RBC: 4.37 MIL/uL (ref 3.87–5.11)
RDW: 13.8 % (ref 11.5–15.5)
WBC: 14.1 10*3/uL — ABNORMAL HIGH (ref 4.0–10.5)

## 2015-03-19 LAB — PROTIME-INR
INR: 0.92 (ref 0.00–1.49)
Prothrombin Time: 12.6 seconds (ref 11.6–15.2)

## 2015-03-19 LAB — BASIC METABOLIC PANEL
ANION GAP: 9 (ref 5–15)
BUN: 20 mg/dL (ref 6–20)
CALCIUM: 9.1 mg/dL (ref 8.9–10.3)
CO2: 25 mmol/L (ref 22–32)
CREATININE: 0.73 mg/dL (ref 0.44–1.00)
Chloride: 104 mmol/L (ref 101–111)
Glucose, Bld: 110 mg/dL — ABNORMAL HIGH (ref 65–99)
Potassium: 4.3 mmol/L (ref 3.5–5.1)
SODIUM: 138 mmol/L (ref 135–145)

## 2015-03-19 LAB — I-STAT TROPONIN, ED: TROPONIN I, POC: 0 ng/mL (ref 0.00–0.08)

## 2015-03-19 MED ORDER — ACYCLOVIR 400 MG PO TABS
800.0000 mg | ORAL_TABLET | Freq: Every day | ORAL | Status: DC
  Administered 2015-03-20 – 2015-03-21 (×9): 800 mg via ORAL
  Filled 2015-03-19 (×9): qty 2

## 2015-03-19 MED ORDER — LIDOCAINE-EPINEPHRINE 2 %-1:200000 IJ SOLN
INTRAMUSCULAR | Status: AC
Start: 2015-03-19 — End: 2015-03-19
  Administered 2015-03-19: 10 mL via INTRADERMAL
  Filled 2015-03-19: qty 20

## 2015-03-19 MED ORDER — CHOLECALCIFEROL 10 MCG (400 UNIT) PO TABS
400.0000 [IU] | ORAL_TABLET | Freq: Every day | ORAL | Status: DC
  Administered 2015-03-20 – 2015-03-21 (×2): 400 [IU] via ORAL
  Filled 2015-03-19 (×2): qty 1

## 2015-03-19 MED ORDER — ESCITALOPRAM OXALATE 10 MG PO TABS
10.0000 mg | ORAL_TABLET | Freq: Every day | ORAL | Status: DC
  Administered 2015-03-20 – 2015-03-21 (×2): 10 mg via ORAL
  Filled 2015-03-19 (×2): qty 1

## 2015-03-19 MED ORDER — ONDANSETRON HCL 4 MG/2ML IJ SOLN: 4.0000 mg | Freq: Four times a day (QID) | INTRAMUSCULAR | Status: DC | PRN

## 2015-03-19 MED ORDER — LIDOCAINE-EPINEPHRINE (PF) 2 %-1:200000 IJ SOLN
10.0000 mL | Freq: Once | INTRAMUSCULAR | Status: AC
  Administered 2015-03-19: 10 mL via INTRADERMAL

## 2015-03-19 MED ORDER — ACETAMINOPHEN 325 MG PO TABS
650.0000 mg | ORAL_TABLET | Freq: Four times a day (QID) | ORAL | Status: DC | PRN
  Administered 2015-03-20 – 2015-03-21 (×3): 650 mg via ORAL
  Filled 2015-03-19 (×3): qty 2

## 2015-03-19 MED ORDER — PANTOPRAZOLE SODIUM 40 MG PO TBEC
40.0000 mg | DELAYED_RELEASE_TABLET | Freq: Every day | ORAL | Status: DC
  Administered 2015-03-20 – 2015-03-21 (×2): 40 mg via ORAL
  Filled 2015-03-19 (×2): qty 1

## 2015-03-19 MED ORDER — SODIUM CHLORIDE 0.9 % IJ SOLN: 3.0000 mL | INTRAMUSCULAR | Status: DC | PRN

## 2015-03-19 MED ORDER — SODIUM CHLORIDE 0.9 % IV SOLN: 250.0000 mL | INTRAVENOUS | Status: DC | PRN

## 2015-03-19 MED ORDER — OXYBUTYNIN CHLORIDE ER 15 MG PO TB24
15.0000 mg | ORAL_TABLET | Freq: Every day | ORAL | Status: DC
  Administered 2015-03-19 – 2015-03-20 (×2): 15 mg via ORAL
  Filled 2015-03-19 (×3): qty 1

## 2015-03-19 MED ORDER — SODIUM CHLORIDE 0.9 % IJ SOLN
3.0000 mL | Freq: Two times a day (BID) | INTRAMUSCULAR | Status: DC
  Administered 2015-03-20: 3 mL via INTRAVENOUS

## 2015-03-19 MED ORDER — ATORVASTATIN CALCIUM 10 MG PO TABS
10.0000 mg | ORAL_TABLET | Freq: Every day | ORAL | Status: DC
  Administered 2015-03-20: 10 mg via ORAL
  Filled 2015-03-19: qty 1

## 2015-03-19 MED ORDER — HYDROXYZINE HCL 10 MG PO TABS
10.0000 mg | ORAL_TABLET | ORAL | Status: DC | PRN
  Filled 2015-03-19: qty 1

## 2015-03-19 MED ORDER — HYDROMORPHONE HCL 1 MG/ML IJ SOLN: 0.5000 mg | INTRAMUSCULAR | Status: DC | PRN

## 2015-03-19 MED ORDER — AMLODIPINE BESYLATE 5 MG PO TABS
2.5000 mg | ORAL_TABLET | Freq: Every day | ORAL | Status: DC
  Administered 2015-03-20 – 2015-03-21 (×2): 2.5 mg via ORAL
  Filled 2015-03-19 (×2): qty 1

## 2015-03-19 MED ORDER — SODIUM CHLORIDE 0.9 % IJ SOLN
3.0000 mL | Freq: Two times a day (BID) | INTRAMUSCULAR | Status: DC
  Administered 2015-03-20 (×2): 3 mL via INTRAVENOUS

## 2015-03-19 MED ORDER — ONDANSETRON HCL 4 MG PO TABS: 4.0000 mg | ORAL_TABLET | Freq: Four times a day (QID) | ORAL | Status: DC | PRN

## 2015-03-19 MED ORDER — ACETAMINOPHEN 325 MG PO TABS
650.0000 mg | ORAL_TABLET | Freq: Once | ORAL | Status: AC
  Administered 2015-03-19: 650 mg via ORAL
  Filled 2015-03-19: qty 2

## 2015-03-19 NOTE — ED Provider Notes (Signed)
Patient seen/examined in the Emergency Department in conjunction with Midlevel Provider  Patient reports fall with scalp laceration Exam : awake/alert, right sided scalp laceration noted Plan: CT imaging recommended.     Ripley Fraise, MD 03/19/15 [phone]

## 2015-03-19 NOTE — ED Notes (Signed)
Patient transported to CT 

## 2015-03-19 NOTE — ED Notes (Signed)
PA at bedside.

## 2015-03-19 NOTE — ED Notes (Signed)
Pt in xray

## 2015-03-19 NOTE — ED Provider Notes (Signed)
CSN: [id number]     Arrival date & time 03/19/15  1800 History   First MD Initiated Contact with Patient 03/19/15 1818     Chief Complaint  Patient presents with  . Head Laceration   HPI  Jean Bowen is a 69 year old female presenting with a head laceration after a fall. Patient does not have memory of the fall but reports that she was walking her dog immediately before the event. She states that her next memory is walking back to her house and noting that her head was bleeding. She is unsure for how long she was down after the fall. Her husband is at bedside and states that she was initially confused and could not remember driving directions for [facility]. He did not witness her fall. She is complaining of right sided head pain at the site of the laceration. She has held pressure to the wound with a towel but the wound continues to bleed. She is also complaining of right shoulder pain. 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 She reports decreased range of motion in the shoulder due to pain. Denies numbness, tingling, loss of sensation or weakness of the upper extremities. She is also complaining of right hand soreness over the thenar eminence. She believes she must have caught herself when she fell. Denies fevers, dizziness, visual disturbances, chest pain, shortness of breath, nausea, vomiting, abdominal pain or other wounds.   Past Medical History  Diagnosis Date  . URINARY INCONTINENCE 12/03/2006  . OSTEOPOROSIS 12/03/2006  . HYPERTENSION 12/03/2006  . HYPERCHOLESTEROLEMIA 02/23/2010  . GERD 03/03/2007  . DEPRESSION 12/03/2006  . Hyperplastic colon polyp 12/03/2006  . ANGIOEDEMA 08/22/09  . DERMATITIS, PERIORAL 01/08/2010  . History of benign gastric tumor     significant for a benign gastric tumor that weighed approximately 5 pounds and was resected.  . OSA (obstructive sleep apnea)   . Hiatal hernia   . Diverticulosis   . Sleep apnea     Past Surgical History  Procedure Laterality Date  . Cholecystectomy    . Knee arthroscopy      Bilateral-torn meniscus  . Esophagogastroduodenoscopy  11/17/2006  . Stress cardiolite  02/14/2006  . Tumor removal  2003    removal 5 lb benign tumor from abdomen  . 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 Bunionectomy      both feet  . Electrocardiogram  02/07/2006  . Hx of nasal septal reconstruction for deviated septum  1980's  . Cataract extraction      Bil   Family History  Problem Relation Age of Onset  . Melanoma Mother   . Lung cancer Mother   . Colon cancer Father     at advanced age  . Arthritis Father   . Lung cancer Brother   . Heart attack      grandfather  . Diabetes      grandfather/grandmother   Social History  Substance Use Topics  . Smoking status: Former Smoker    Types: Cigarettes    Quit date: 05/22/2001  . Smokeless tobacco: Never Used  . Alcohol Use: 1.8 oz/week    3 Glasses of wine per week     Comment: 1-2/week   OB History    No data available     Review of Systems  Musculoskeletal: Positive for arthralgias.  Skin: Positive for wound.  Neurological: Positive  for syncope.  All other systems reviewed and are negative.     Allergies  Doxycycline; Losartan potassium; and Morphine and related  Home Medications   Prior to Admission medications   Medication Sig Start Date End Date Taking? Authorizing Provider  acetaminophen (TYLENOL) 500 MG tablet Take 500 mg by mouth every 6 (six) hours as needed for moderate pain.    Yes Historical Provider, MD  acyclovir (ZOVIRAX) 400 MG tablet TAKE 2 TABS BY MOUTH EVERY 4 HOURS WHILE AWAKE. 02/11/15  Yes Renato Shin, MD  amLODipine (NORVASC) 2.5 MG tablet TAKE 1 TABLET (2.5 MG TOTAL) BY MOUTH DAILY. 02/11/15  Yes Renato Shin, MD  aspirin 81 MG tablet Take 81 mg by mouth daily.     Yes Historical Provider, MD  atorvastatin (LIPITOR) 10 MG tablet Take 1 tablet (10 mg  total) by mouth daily. 02/14/15  Yes Renato Shin, MD  cholecalciferol (VITAMIN D-400) 400 UNITS TABS Take 400 Units by mouth daily.     Yes Historical Provider, MD  escitalopram (LEXAPRO) 10 MG tablet Take 1 tablet (10 mg total) by mouth daily. 02/11/15  Yes Renato Shin, MD  lansoprazole (PREVACID) 15 MG capsule Take 15 mg by mouth daily at 12 noon.   Yes Historical Provider, MD  omeprazole (PRILOSEC) 40 MG capsule Take 1 capsule (40 mg total) by mouth daily. 10/16/14  Yes Lafayette Dragon, MD  oxybutynin (DITROPAN) 5 MG tablet Take 1 tablet (5 mg total) by mouth 3 (three) times daily. 02/11/15 02/11/16 Yes Renato Shin, MD  fluocinonide cream (LIDEX) [id number] % APPLY 1 APPLICATION TOPICALLY 2 (TWO) TIMES DAILY. Patient not taking: Reported on 03/19/2015 11/29/14   Tereasa Coop, PA-C  hydrOXYzine (ATARAX/VISTARIL) 10 MG tablet Take 1 tablet (10 mg total) by mouth 3 (three) times daily as needed. 11/23/14   Tereasa Coop, PA-C  permethrin (ACTICIN) 5 % cream Apply 1 application topically once. Patient not taking: Reported on 03/19/2015 11/23/14   Tereasa Coop, PA-C   BP 179/95 mmHg  Pulse 86  Temp(Src) 98.6 F (37 C) (Oral)  Resp 18  Ht 5\' 7"  (1.702 m)  Wt 80 kg  BMI 27.62 kg/m2  SpO2 95% Physical Exam  Constitutional: She is oriented to person, place, and time. She appears well-developed and well-nourished. No distress.  HENT:  Head: Normocephalic and atraumatic.  Mouth/Throat: Oropharynx is clear and moist. No oropharyngeal exudate.  Eyes: Conjunctivae and EOM are normal. Pupils are equal, round, and reactive to light. Right eye exhibits no discharge. Left eye exhibits no discharge. No scleral icterus.  Neck: Normal range of motion. Neck supple.  Cardiovascular: Normal rate, regular rhythm and normal heart sounds.   Pulmonary/Chest: Effort normal and breath sounds normal. No respiratory distress. She has no wheezes. She has no rales.  Abdominal: Soft. There is no tenderness.   Musculoskeletal: Normal range of motion.  Right shoulder TTP. Decreased ROM due to pain. No obvious deformity or edema. Right elbow, wrist and digit movement intact. Moves remaining extremities without pain.   Neurological: She is alert and oriented to person, place, and time. No cranial nerve deficit. Coordination normal.  Cranial nerves 3-12 intact. Major muscle groups with 5/5 motor strength. Sensation to light touch intact. Coordinated finger to nose.   Skin: Skin is warm and dry.  4 cm laceration noted to right parietal scalp. Wound continues to ooze blood after presentation and requires 10 minutes of further pressure to stem bleeding. No foreign bodies visualized in bloodless field after  irrigation. Wound edges approximate well.   Psychiatric: She has a normal mood and affect. Her behavior is normal.  Nursing note and vitals reviewed.   ED Course  Procedures (including critical care time)  LACERATION REPAIR Performed by: Eston Esters Authorized by: Eston Esters Consent: Verbal consent obtained. Risks and benefits: risks, benefits and alternatives were discussed Consent given by: patient Patient identity confirmed: provided demographic data Prepped and Draped in normal sterile fashion Wound explored  Laceration Location: right parietal scalp  Laceration Length: 4 cm  No Foreign Bodies seen or palpated  Anesthesia: local infiltration  Local anesthetic: lidocaine 2%  epinephrine  Anesthetic total: 4 ml  Irrigation method: syringe Amount of cleaning: standard  Skin closure: staples  Number of sutures: 7  Technique: staples  Patient tolerance: Patient tolerated the procedure well with no immediate complications.   Labs Review Labs Reviewed  CBC WITH DIFFERENTIAL/PLATELET - Abnormal; Notable for the following:    WBC 14.1 (*)    Neutro Abs 10.8 (*)    Monocytes Absolute 1.3 (*)    All other components within normal limits  BASIC METABOLIC PANEL -  Abnormal; Notable for the following:    Glucose, Bld 110 (*)    All other components within normal limits  PROTIME-INR  MAGNESIUM  TROPONIN I  TROPONIN I  TSH  PHOSPHORUS  CBC WITH DIFFERENTIAL/PLATELET  COMPREHENSIVE METABOLIC PANEL  I-STAT TROPOININ, ED    Imaging Review Dg Shoulder Right  03/19/2015  CLINICAL DATA:  69 year old female with fall and trauma to the right shoulder trauma. EXAM: RIGHT SHOULDER - 2+ VIEW COMPARISON:  None. FINDINGS: There is no acute fracture or dislocation. 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 The soft tissues are unremarkable. IMPRESSION: No fracture or dislocation Electronically Signed   By: Anner Crete M.D.   On: 03/19/2015 20:31   Ct Head Wo Contrast  03/19/2015  CLINICAL DATA:  Status post fall.  Head laceration. EXAM: CT HEAD WITHOUT CONTRAST CT CERVICAL SPINE WITHOUT CONTRAST TECHNIQUE: Multidetector CT imaging of the head and cervical spine was performed following the standard protocol without intravenous contrast. Multiplanar CT image reconstructions of the cervical spine were also generated. COMPARISON:  None. FINDINGS: CT HEAD FINDINGS 7 mm hyperdense focus in the right frontal lobe most consistent with a small intraparenchymal hemorrhage with mild surrounding vasogenic edema. There is no evidence of mass effect, midline shift, or extra-axial fluid collections. There is no evidence of a space-occupying lesion. There is no evidence of a cortical-based area of acute infarction. There is generalized cerebral atrophy. There is periventricular white matter low attenuation likely secondary to microangiopathy. The ventricles and sulci are appropriate for the patient's age. The basal cisterns are patent. Visualized portions of the orbits are unremarkable. The visualized portions of the paranasal sinuses and mastoid air cells are unremarkable. Cerebrovascular atherosclerotic  calcifications are noted. The osseous structures are unremarkable. CT CERVICAL SPINE FINDINGS The alignment is anatomic. The vertebral body heights are maintained. There is no acute fracture. There is no static listhesis. The prevertebral soft tissues are normal. The intraspinal soft tissues are not fully imaged on this examination due to poor soft tissue contrast, but there is no gross soft tissue abnormality. There is degenerative disc disease with disc height loss at C5-6 and C6-7 and to a lesser degree C4-5. There is fusion of the left posterior elements at C2-3. There is moderate left facet arthropathy at C3-4. There is moderate  right facet arthropathy at C4-5 with bilateral uncovertebral degenerative changes and foraminal narrowing. Bilateral uncovertebral degenerative changes and a broad-based disc osteophyte complex at C5-6 with bilateral foraminal narrowing. Left uncovertebral degenerative changes at C6-7 with left foraminal narrowing. The visualized portions of the lung apices demonstrate no focal abnormality. There is left carotid artery atherosclerosis. IMPRESSION: 1. 7 mm intraparenchymal hemorrhage in the right frontal lobe. 2. No acute osseous injury of the cervical spine. Electronically Signed   By: Kathreen Devoid   On: 03/19/2015 19:50   Ct Cervical Spine Wo Contrast  03/19/2015  CLINICAL DATA:  Status post fall.  Head laceration. EXAM: CT HEAD WITHOUT CONTRAST CT CERVICAL SPINE WITHOUT CONTRAST TECHNIQUE: Multidetector CT imaging of the head and cervical spine was performed following the standard protocol without intravenous contrast. Multiplanar CT image reconstructions of the cervical spine were also generated. COMPARISON:  None. FINDINGS: CT HEAD FINDINGS 7 mm hyperdense focus in the right frontal lobe most consistent with a small intraparenchymal hemorrhage with mild surrounding vasogenic edema. There is no evidence of mass effect, midline shift, or extra-axial fluid collections. There is no  evidence of a space-occupying lesion. There is no evidence of a cortical-based area of acute infarction. There is generalized cerebral atrophy. There is periventricular white matter low attenuation likely secondary to microangiopathy. The ventricles and sulci are appropriate for the patient's age. The basal cisterns are patent. Visualized portions of the orbits are unremarkable. The visualized portions of the paranasal sinuses and mastoid air cells are unremarkable. Cerebrovascular atherosclerotic calcifications are noted. The osseous structures are unremarkable. CT CERVICAL SPINE FINDINGS The alignment is anatomic. The vertebral body heights are maintained. There is no acute fracture. There is no static listhesis. The prevertebral soft tissues are normal. The intraspinal soft tissues are not fully imaged on this examination due to poor soft tissue contrast, but there is no gross soft tissue abnormality. There is degenerative disc disease with disc height loss at C5-6 and C6-7 and to a lesser degree C4-5. There is fusion of the left posterior elements at C2-3. There is moderate left facet arthropathy at C3-4. There is moderate right facet arthropathy at C4-5 with bilateral uncovertebral degenerative changes and foraminal narrowing. Bilateral uncovertebral degenerative changes and a broad-based disc osteophyte complex at C5-6 with bilateral foraminal narrowing. Left uncovertebral degenerative changes at C6-7 with left foraminal narrowing. The visualized portions of the lung apices demonstrate no focal abnormality. There is left carotid artery atherosclerosis. IMPRESSION: 1. 7 mm intraparenchymal hemorrhage in the right frontal lobe. 2. No acute osseous injury of the cervical spine. Electronically Signed   By: Kathreen Devoid   On: 03/19/2015 19:50   I have personally reviewed and evaluated these images and lab results as part of my medical decision-making.   EKG Interpretation   Date/Time:  Wednesday March 19 2015 21:10:52 EST Ventricular Rate:  84 PR Interval:  145 QRS Duration: 72 QT Interval:  504 QTC Calculation: 596 R Axis:   64 Text Interpretation:  Sinus rhythm Borderline repolarization abnormality  Prolonged QT interval Abnormal ekg Confirmed by Christy Gentles  MD, [place]  [id number]) on 03/19/2015 9:44:37 PM      MDM   Final diagnoses:  Fall   Pt presenting with head laceration after an unwitnessed fall with questionable syncope. Pt has total amnesia of the event. VSS. Pt has 4 cm laceration to right parietal scalp. This wound was repaired with 7 staples. Nonfocal neuro exam. Right shoulder is TTP with decreased ROM due to pain.  Extremities are neurovascularly intact. CT head shows a 7 mm intraparenchymal hemorrhage to the right frontal lobe. Neurosurgery consulted (Dr. Cyndy Freeze) who personally viewed the CT results. He recommended giving patient his office number and instructions to call if she develops neurodeficits. ECG noted to have an increased QT to 596 that appears to be a new finding. Troponin 0.00. [facility] hospitalist consulted for admission for syncope workup. Dr. Olevia Bowens will admit the pt.     Josephina Gip, PA-C 03/20/15 0010  Ripley Fraise, MD 03/20/15 1145

## 2015-03-19 NOTE — H&P (Signed)
[facility] History and Physical  Jean Bowen DOB: September 27, 1945 DOA: 03/19/2015  Referring physician: Josephina Gip, PA-C PCP: Renato Shin, MD   Chief Complaint: Fall  HPI: Jean Bowen is a 69 y.o. female   with a past medical history of hyperlipidemia, hypertension, urinary incontinence, GERD, osteoporosis, depression, sleep apnea (not on CPAP) who was brought to the emergency department after having a fall complicated by a parietal laceration at home and right shoulder trauma. Per patient, she does not remember falling or hitting her head. She denies chest pain, palpitations, dizziness, diaphoresis, vision changes, headache or nausea at any time prior to falling or in recent weeks prior to this incident.  Her last memory preceeding the fall was of returning home from walking the dog. The next thing she remembers is walking into her house and calling her husband for help after the fall. She does not know for how long she was unconscious, but according to her husband, it could not be more than 2 or 3 minutes, since he was arriving home at the same time. He thinks that not a lot of time passed between the patient returning home from walking the dog and coming inside the house asking for help.  The patient denies nausea, chest pain, dyspnea, dizziness, palpitations, vision changes, double vision, photophobia or phonophobia after the fall. She states that her headache is better with acetaminophen and states that she does not require further intervention for headache at this time.  Review of Systems:  Constitutional:  No weight loss, night sweats, Fevers, chills, fatigue.  HEENT:  No headaches, Difficulty swallowing,Tooth/dental problems,Sore throat,  No sneezing, itching, ear ache, nasal congestion, post nasal drip,  Cardio-vascular:  No chest pain, Orthopnea, PND, swelling in lower extremities, anasarca, dizziness, palpitations  GI:  No heartburn, indigestion,  abdominal pain, nausea, vomiting, diarrhea, change in bowel habits, loss of appetite  Resp:  No dyspnea, productive cough, wheezing or hemoptysis. Skin:  no rash or lesions.  GU:  no dysuria, change in color of urine, chronic urgency controlled by oxybutynin. No flank pain.  Musculoskeletal:  Right shoulder tenderness with mild decreased in range of motion.  Psych:  No change in mood or affect. No depression or anxiety. No memory loss.   Past Medical History  Diagnosis Date  . URINARY INCONTINENCE 12/03/2006  . OSTEOPOROSIS 12/03/2006  . HYPERTENSION 12/03/2006  . HYPERCHOLESTEROLEMIA 02/23/2010  . GERD 03/03/2007  . DEPRESSION 12/03/2006  . Hyperplastic colon polyp 12/03/2006  . ANGIOEDEMA 08/22/09  . DERMATITIS, PERIORAL 01/08/2010  . History of benign gastric tumor     significant for a benign gastric tumor that weighed approximately 5 pounds and was resected.  . OSA (obstructive sleep apnea)   . Hiatal hernia   . Diverticulosis   . Sleep apnea    Past Surgical History  Procedure Laterality Date  . Cholecystectomy    . Knee arthroscopy      Bilateral-torn meniscus  . Esophagogastroduodenoscopy  11/17/2006  . Stress cardiolite  02/14/2006  . Tumor removal  2003    removal 5 lb benign tumor from abdomen  . 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 Bunionectomy      both feet  . Electrocardiogram  02/07/2006  . Hx of nasal septal reconstruction for deviated septum  1980's  . Cataract extraction      Bil   Social History:  reports that she quit smoking about 13 years ago.  Her smoking use included Cigarettes. She has never used smokeless tobacco. She reports that she drinks about 1.8 oz of alcohol per week. She reports that she does not use illicit drugs.  Allergies  Allergen Reactions  . Doxycycline     REACTION: swollen lips and throat  . Losartan Potassium     REACTION: ? of angioedema  . Morphine And Related     Caused vomiting when  given too fast in IV.    Family History  Problem Relation Age of Onset  . Melanoma Mother   . Lung cancer Mother   . Colon cancer Father     at advanced age  . Arthritis Father   . Lung cancer Brother   . Heart attack      grandfather  . Diabetes      grandfather/grandmother    Prior to Admission medications   Medication Sig Start Date End Date Taking? Authorizing Provider  acetaminophen (TYLENOL) 500 MG tablet Take 500 mg by mouth every 6 (six) hours as needed for moderate pain.    Yes Historical Provider, MD  acyclovir (ZOVIRAX) 400 MG tablet TAKE 2 TABS BY MOUTH EVERY 4 HOURS WHILE AWAKE. 02/11/15  Yes Renato Shin, MD  amLODipine (NORVASC) 2.5 MG tablet TAKE 1 TABLET (2.5 MG TOTAL) BY MOUTH DAILY. 02/11/15  Yes Renato Shin, MD  aspirin 81 MG tablet Take 81 mg by mouth daily.     Yes Historical Provider, MD  atorvastatin (LIPITOR) 10 MG tablet Take 1 tablet (10 mg total) by mouth daily. 02/14/15  Yes Renato Shin, MD  cholecalciferol (VITAMIN D-400) 400 UNITS TABS Take 400 Units by mouth daily.     Yes Historical Provider, MD  escitalopram (LEXAPRO) 10 MG tablet Take 1 tablet (10 mg total) by mouth daily. 02/11/15  Yes Renato Shin, MD  lansoprazole (PREVACID) 15 MG capsule Take 15 mg by mouth daily at 12 noon.   Yes Historical Provider, MD  omeprazole (PRILOSEC) 40 MG capsule Take 1 capsule (40 mg total) by mouth daily. 10/16/14  Yes Lafayette Dragon, MD  oxybutynin (DITROPAN) 5 MG tablet Take 1 tablet (5 mg total) by mouth 3 (three) times daily. 02/11/15 02/11/16 Yes Renato Shin, MD  fluocinonide cream (LIDEX) [id number] % APPLY 1 APPLICATION TOPICALLY 2 (TWO) TIMES DAILY. Patient not taking: Reported on 03/19/2015 11/29/14   Tereasa Coop, PA-C  hydrOXYzine (ATARAX/VISTARIL) 10 MG tablet Take 1 tablet (10 mg total) by mouth 3 (three) times daily as needed. 11/23/14   Tereasa Coop, PA-C  permethrin (ACTICIN) 5 % cream Apply 1 application topically once. Patient not taking: Reported on  03/19/2015 11/23/14   Tereasa Coop, PA-C   Physical Exam: Filed Vitals:   03/19/15 2050 03/19/15 2100 03/19/15 2130  BP: 172/98 165/98 170/96  Pulse: 86 82 82  Temp: 99 F (37.2 C)    TempSrc: Oral    Resp: 18 17 16   SpO2: 95% 94% 96%    Wt Readings from Last 3 Encounters:  02/11/15 81.194 kg (179 lb)  11/23/14 84.46 kg (186 lb 3.2 oz)  11/01/14 83.915 kg (185 lb)    General:  Appears calm and comfortable Head: Positive staple sutured 6-7 cm laceration on right parietal area. Eyes: PERRL, normal lids, irises & conjunctiva ENT: grossly normal hearing, lips and oral mucosa are moist. Neck: no LAD, masses or thyromegaly Cardiovascular: RRR, no m/r/g. No LE edema. Telemetry: Monitor not on. Respiratory: CTA bilaterally, no w/r/r. NormBUE/al respiratory effort. Abdomen: soft, ntnd  Skin: no rash or induration seen on limited exam Musculoskeletal: grossly normal tone BLE Psychiatric: grossly normal mood and affect, speech fluent and appropriate Neurologic: Awake alert oriented 3, annual nerve, sensory, motor or cerebellar abnormalities on gross neurological exam. Gait evaluation was deferred.           Labs on Admission:  Basic Metabolic Panel:  Recent Labs Lab 03/19/15 2036  NA 138  K 4.3  CL 104  CO2 25  GLUCOSE 110*  BUN 20  CREATININE 0.73  CALCIUM 9.1   CBC:  Recent Labs Lab 03/19/15 2036  WBC 14.1*  NEUTROABS 10.8*  HGB 13.5  HCT 40.9  MCV 93.6  PLT 225    Radiological Exams on Admission: Dg Shoulder Right  03/19/2015  CLINICAL DATA:  69 year old female with fall and trauma to the right shoulder trauma. EXAM: RIGHT SHOULDER - 2+ VIEW COMPARISON:  None. FINDINGS: There is no acute fracture or dislocation. 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 The soft tissues are unremarkable. IMPRESSION: No fracture or dislocation Electronically Signed   By: Anner Crete M.D.   On: 03/19/2015  20:31   Ct Head Wo Contrast  03/19/2015  CLINICAL DATA:  Status post fall.  Head laceration. EXAM: CT HEAD WITHOUT CONTRAST CT CERVICAL SPINE WITHOUT CONTRAST TECHNIQUE: Multidetector CT imaging of the head and cervical spine was performed following the standard protocol without intravenous contrast. Multiplanar CT image reconstructions of the cervical spine were also generated. COMPARISON:  None. FINDINGS: CT HEAD FINDINGS 7 mm hyperdense focus in the right frontal lobe most consistent with a small intraparenchymal hemorrhage with mild surrounding vasogenic edema. There is no evidence of mass effect, midline shift, or extra-axial fluid collections. There is no evidence of a space-occupying lesion. There is no evidence of a cortical-based area of acute infarction. There is generalized cerebral atrophy. There is periventricular white matter low attenuation likely secondary to microangiopathy. The ventricles and sulci are appropriate for the patient's age. The basal cisterns are patent. Visualized portions of the orbits are unremarkable. The visualized portions of the paranasal sinuses and mastoid air cells are unremarkable. Cerebrovascular atherosclerotic calcifications are noted. The osseous structures are unremarkable. CT CERVICAL SPINE FINDINGS The alignment is anatomic. The vertebral body heights are maintained. There is no acute fracture. There is no static listhesis. The prevertebral soft tissues are normal. The intraspinal soft tissues are not fully imaged on this examination due to poor soft tissue contrast, but there is no gross soft tissue abnormality. There is degenerative disc disease with disc height loss at C5-6 and C6-7 and to a lesser degree C4-5. There is fusion of the left posterior elements at C2-3. There is moderate left facet arthropathy at C3-4. There is moderate right facet arthropathy at C4-5 with bilateral uncovertebral degenerative changes and foraminal narrowing. Bilateral uncovertebral  degenerative changes and a broad-based disc osteophyte complex at C5-6 with bilateral foraminal narrowing. Left uncovertebral degenerative changes at C6-7 with left foraminal narrowing. The visualized portions of the lung apices demonstrate no focal abnormality. There is left carotid artery atherosclerosis. IMPRESSION: 1. 7 mm intraparenchymal hemorrhage in the right frontal lobe. 2. No acute osseous injury of the cervical spine. Electronically Signed   By: Kathreen Devoid   On: 03/19/2015 19:50   Ct Cervical Spine Wo Contrast  03/19/2015  CLINICAL DATA:  Status post fall.  Head laceration. EXAM: CT HEAD WITHOUT CONTRAST CT CERVICAL SPINE WITHOUT CONTRAST TECHNIQUE: Multidetector CT imaging of  the head and cervical spine was performed following the standard protocol without intravenous contrast. Multiplanar CT image reconstructions of the cervical spine were also generated. COMPARISON:  None. FINDINGS: CT HEAD FINDINGS 7 mm hyperdense focus in the right frontal lobe most consistent with a small intraparenchymal hemorrhage with mild surrounding vasogenic edema. There is no evidence of mass effect, midline shift, or extra-axial fluid collections. There is no evidence of a space-occupying lesion. There is no evidence of a cortical-based area of acute infarction. There is generalized cerebral atrophy. There is periventricular white matter low attenuation likely secondary to microangiopathy. The ventricles and sulci are appropriate for the patient's age. The basal cisterns are patent. Visualized portions of the orbits are unremarkable. The visualized portions of the paranasal sinuses and mastoid air cells are unremarkable. Cerebrovascular atherosclerotic calcifications are noted. The osseous structures are unremarkable. CT CERVICAL SPINE FINDINGS The alignment is anatomic. The vertebral body heights are maintained. There is no acute fracture. There is no static listhesis. The prevertebral soft tissues are normal. The  intraspinal soft tissues are not fully imaged on this examination due to poor soft tissue contrast, but there is no gross soft tissue abnormality. There is degenerative disc disease with disc height loss at C5-6 and C6-7 and to a lesser degree C4-5. There is fusion of the left posterior elements at C2-3. There is moderate left facet arthropathy at C3-4. There is moderate right facet arthropathy at C4-5 with bilateral uncovertebral degenerative changes and foraminal narrowing. Bilateral uncovertebral degenerative changes and a broad-based disc osteophyte complex at C5-6 with bilateral foraminal narrowing. Left uncovertebral degenerative changes at C6-7 with left foraminal narrowing. The visualized portions of the lung apices demonstrate no focal abnormality. There is left carotid artery atherosclerosis. IMPRESSION: 1. 7 mm intraparenchymal hemorrhage in the right frontal lobe. 2. No acute osseous injury of the cervical spine. Electronically Signed   By: Kathreen Devoid   On: 03/19/2015 19:50    EKG: Independently reviewed. Vent. rate 84 BPM PR interval 145 ms QRS duration 72 ms QT/QTc 504/596 ms P-R-T axes 84 64 237 Sinus rhythm Borderline repolarization abnormality Prolonged QT interval Abnormal ekg  Assessment/Plan Principal Problem:   Syncope and collapse Admit to telemetry for overnight observation. Neuro checks every 4 hours. Trend troponin levels. Check carotid Doppler and echocardiogram.  Active Problems:   Traumatic intracerebral hemorrhage No neurological symptoms at this time. Continue neuro checks every 4 hours. The case was discussed by the emergency department with neurosurgery on call, which did not suggest anything further other than observation, repeat imaging and reconsult if the patient develops any symptoms.    HYPERCHOLESTEROLEMIA Continue atorvastatin and monitor LFTs periodically.    Essential hypertension Continue amlodipine 2.5 mg by mouth daily. Monitor blood  pressure periodically.    GERD Continue proton pump inhibitor therapy.    URINARY INCONTINENCE Continue oxybutynin.    Leukocytosis No history of fever. She denies sore throat, productive cough, diarrhea or urinary symptoms. Recheck CBC in the morning and monitor for fever or any other signs of infection.    Code Status: Full code. DVT Prophylaxis: Mechanical with SCDs. Family Communication:  Disposition Plan: Admit to telemetry for syncope workup and overnight neurological observation.  Time spent: Over 70 minutes were spent during the process of this admission.  Reubin Milan [facility] Pager [phone].

## 2015-03-19 NOTE — ED Notes (Signed)
Per pt/husband, was out walking the dog-doesn't not know how she fell-head laceration to right side of head-bleeding uncontrolled at this time-patient's husband states her memory has not been good since her fall-could not remember how to get to [facility] and [facility] ED-no nausea, vomiting-no lethargy-states complaining of right head, shoulder, and leg pain

## 2015-03-20 ENCOUNTER — Observation Stay (HOSPITAL_COMMUNITY): Payer: Medicare Other

## 2015-03-20 ENCOUNTER — Inpatient Hospital Stay (HOSPITAL_COMMUNITY): Payer: Medicare Other

## 2015-03-20 DIAGNOSIS — D72829 Elevated white blood cell count, unspecified: Secondary | ICD-10-CM | POA: Diagnosis not present

## 2015-03-20 DIAGNOSIS — S06341D Traumatic hemorrhage of right cerebrum with loss of consciousness of 30 minutes or less, subsequent encounter: Secondary | ICD-10-CM | POA: Diagnosis not present

## 2015-03-20 DIAGNOSIS — K219 Gastro-esophageal reflux disease without esophagitis: Secondary | ICD-10-CM | POA: Diagnosis present

## 2015-03-20 DIAGNOSIS — Z808 Family history of malignant neoplasm of other organs or systems: Secondary | ICD-10-CM | POA: Diagnosis not present

## 2015-03-20 DIAGNOSIS — I10 Essential (primary) hypertension: Secondary | ICD-10-CM | POA: Diagnosis not present

## 2015-03-20 DIAGNOSIS — Z888 Allergy status to other drugs, medicaments and biological substances status: Secondary | ICD-10-CM | POA: Diagnosis not present

## 2015-03-20 DIAGNOSIS — R55 Syncope and collapse: Secondary | ICD-10-CM | POA: Diagnosis not present

## 2015-03-20 DIAGNOSIS — G4733 Obstructive sleep apnea (adult) (pediatric): Secondary | ICD-10-CM | POA: Diagnosis present

## 2015-03-20 DIAGNOSIS — W1830XA Fall on same level, unspecified, initial encounter: Secondary | ICD-10-CM | POA: Diagnosis present

## 2015-03-20 DIAGNOSIS — Y93K1 Activity, walking an animal: Secondary | ICD-10-CM | POA: Diagnosis not present

## 2015-03-20 DIAGNOSIS — Z881 Allergy status to other antibiotic agents status: Secondary | ICD-10-CM | POA: Diagnosis not present

## 2015-03-20 DIAGNOSIS — Z800 Family history of malignant neoplasm of digestive organs: Secondary | ICD-10-CM | POA: Diagnosis not present

## 2015-03-20 DIAGNOSIS — R413 Other amnesia: Secondary | ICD-10-CM | POA: Diagnosis present

## 2015-03-20 DIAGNOSIS — E7800 Pure hypercholesterolemia, unspecified: Secondary | ICD-10-CM | POA: Diagnosis present

## 2015-03-20 DIAGNOSIS — S06341A Traumatic hemorrhage of right cerebrum with loss of consciousness of 30 minutes or less, initial encounter: Secondary | ICD-10-CM

## 2015-03-20 DIAGNOSIS — Z833 Family history of diabetes mellitus: Secondary | ICD-10-CM | POA: Diagnosis not present

## 2015-03-20 DIAGNOSIS — Z7982 Long term (current) use of aspirin: Secondary | ICD-10-CM | POA: Diagnosis not present

## 2015-03-20 DIAGNOSIS — Z79899 Other long term (current) drug therapy: Secondary | ICD-10-CM | POA: Diagnosis not present

## 2015-03-20 DIAGNOSIS — M810 Age-related osteoporosis without current pathological fracture: Secondary | ICD-10-CM | POA: Diagnosis present

## 2015-03-20 DIAGNOSIS — D72828 Other elevated white blood cell count: Secondary | ICD-10-CM | POA: Diagnosis present

## 2015-03-20 DIAGNOSIS — Z801 Family history of malignant neoplasm of trachea, bronchus and lung: Secondary | ICD-10-CM | POA: Diagnosis not present

## 2015-03-20 DIAGNOSIS — Z87891 Personal history of nicotine dependence: Secondary | ICD-10-CM | POA: Diagnosis not present

## 2015-03-20 DIAGNOSIS — Z91018 Allergy to other foods: Secondary | ICD-10-CM | POA: Diagnosis not present

## 2015-03-20 DIAGNOSIS — S06369A Traumatic hemorrhage of cerebrum, unspecified, with loss of consciousness of unspecified duration, initial encounter: Secondary | ICD-10-CM | POA: Diagnosis present

## 2015-03-20 DIAGNOSIS — E785 Hyperlipidemia, unspecified: Secondary | ICD-10-CM | POA: Diagnosis present

## 2015-03-20 DIAGNOSIS — Z8249 Family history of ischemic heart disease and other diseases of the circulatory system: Secondary | ICD-10-CM | POA: Diagnosis not present

## 2015-03-20 DIAGNOSIS — S0101XA Laceration without foreign body of scalp, initial encounter: Secondary | ICD-10-CM | POA: Diagnosis present

## 2015-03-20 DIAGNOSIS — R32 Unspecified urinary incontinence: Secondary | ICD-10-CM

## 2015-03-20 DIAGNOSIS — Z86010 Personal history of colonic polyps: Secondary | ICD-10-CM | POA: Diagnosis not present

## 2015-03-20 DIAGNOSIS — I739 Peripheral vascular disease, unspecified: Secondary | ICD-10-CM | POA: Diagnosis present

## 2015-03-20 DIAGNOSIS — Z85828 Personal history of other malignant neoplasm of skin: Secondary | ICD-10-CM | POA: Diagnosis not present

## 2015-03-20 DIAGNOSIS — M1990 Unspecified osteoarthritis, unspecified site: Secondary | ICD-10-CM | POA: Diagnosis present

## 2015-03-20 LAB — COMPREHENSIVE METABOLIC PANEL
ALT: 24 U/L (ref 14–54)
AST: 22 U/L (ref 15–41)
Albumin: 3.8 g/dL (ref 3.5–5.0)
Alkaline Phosphatase: 56 U/L (ref 38–126)
Anion gap: 4 — ABNORMAL LOW (ref 5–15)
BUN: 13 mg/dL (ref 6–20)
CHLORIDE: 103 mmol/L (ref 101–111)
CO2: 31 mmol/L (ref 22–32)
CREATININE: 0.58 mg/dL (ref 0.44–1.00)
Calcium: 8.8 mg/dL — ABNORMAL LOW (ref 8.9–10.3)
GFR calc Af Amer: >60 mL/min (ref >60)
Glucose, Bld: 139 mg/dL — ABNORMAL HIGH (ref 65–99)
Potassium: 3.5 mmol/L (ref 3.5–5.1)
SODIUM: 138 mmol/L (ref 135–145)
Total Bilirubin: 1.1 mg/dL (ref 0.3–1.2)
Total Protein: 6.4 g/dL — ABNORMAL LOW (ref 6.5–8.1)

## 2015-03-20 LAB — CBC WITH DIFFERENTIAL/PLATELET
BASOS ABS: 0 10*3/uL (ref 0.0–0.1)
Basophils Relative: 0 %
EOS PCT: 2 %
Eosinophils Absolute: 0.1 10*3/uL (ref 0.0–0.7)
HCT: 37.5 % (ref 36.0–46.0)
HEMOGLOBIN: 12.6 g/dL (ref 12.0–15.0)
LYMPHS PCT: 24 %
Lymphs Abs: 1.4 10*3/uL (ref 0.7–4.0)
MCH: 31.1 pg (ref 26.0–34.0)
MCHC: 33.6 g/dL (ref 30.0–36.0)
MCV: 92.6 fL (ref 78.0–100.0)
Monocytes Absolute: 0.6 10*3/uL (ref 0.1–1.0)
Monocytes Relative: 10 %
NEUTROS ABS: 3.6 10*3/uL (ref 1.7–7.7)
NEUTROS PCT: 64 %
PLATELETS: 203 10*3/uL (ref 150–400)
RBC: 4.05 MIL/uL (ref 3.87–5.11)
RDW: 13.8 % (ref 11.5–15.5)
WBC: 5.7 10*3/uL (ref 4.0–10.5)

## 2015-03-20 LAB — URINALYSIS, ROUTINE W REFLEX MICROSCOPIC
Bilirubin Urine: NEGATIVE
GLUCOSE, UA: 100 mg/dL — AB
HGB URINE DIPSTICK: NEGATIVE
Ketones, ur: NEGATIVE mg/dL
Leukocytes, UA: NEGATIVE
Nitrite: NEGATIVE
Protein, ur: NEGATIVE mg/dL
SPECIFIC GRAVITY, URINE: 1.013 (ref 1.005–1.030)
pH: 6 (ref 5.0–8.0)

## 2015-03-20 LAB — TROPONIN I: Troponin I: <0.03 ng/mL (ref <0.031)

## 2015-03-20 LAB — TSH: TSH: 1.103 u[IU]/mL (ref 0.350–4.500)

## 2015-03-20 LAB — MAGNESIUM
MAGNESIUM: 2.1 mg/dL (ref 1.7–2.4)
Magnesium: 2 mg/dL (ref 1.7–2.4)

## 2015-03-20 LAB — PHOSPHORUS: PHOSPHORUS: 3.5 mg/dL (ref 2.5–4.6)

## 2015-03-20 MED ORDER — IBUPROFEN 200 MG PO TABS
400.0000 mg | ORAL_TABLET | Freq: Four times a day (QID) | ORAL | Status: DC | PRN
  Administered 2015-03-20: 400 mg via ORAL
  Filled 2015-03-20: qty 2

## 2015-03-20 MED ORDER — POTASSIUM CHLORIDE CRYS ER 20 MEQ PO TBCR
40.0000 meq | EXTENDED_RELEASE_TABLET | Freq: Once | ORAL | Status: AC
  Administered 2015-03-20: 40 meq via ORAL
  Filled 2015-03-20: qty 2

## 2015-03-20 MED ORDER — SODIUM CHLORIDE 0.9 % IV SOLN
INTRAVENOUS | Status: DC
  Administered 2015-03-20 – 2015-03-21 (×3): via INTRAVENOUS

## 2015-03-20 NOTE — Progress Notes (Signed)
Pt states hand feeling better. Reports a pain rating of 1.  Lind Guest, RN

## 2015-03-20 NOTE — Progress Notes (Signed)
Pt instructed in use of Incentive Spirometer with good return demonstration.  Lind Guest, RN

## 2015-03-20 NOTE — Progress Notes (Signed)
  Echocardiogram 2D Echocardiogram has been performed.  Bobbye Charleston 03/20/2015, 2:50 PM

## 2015-03-20 NOTE — Progress Notes (Signed)
[facility] PROGRESS NOTE  Jean Bowen [id number] DOB: 1945/08/30 DOA: 03/19/2015 PCP: Renato Shin, MD  Assessment/Plan: #1 syncope Questionable etiology. Patient can remember events surrounding the syncopal episode. Patient currently on telemetry. Cardiac enzymes negative 2. Carotid Dopplers pending. 2-D echo pending. EEG has been done and results are pending. Patient has had orthostatics checked and may be orthostatic by pulse. Place on IV fluids. Monitor. Supportive care. Follow.  #2 leukocytosis Likely reactive leukocytosis. Repeat CBC with resolution of leukocytosis. Chest x-ray has been ordered with no acute infiltrate. Urinalysis nitrite negative leukocytes negative. No need for antibiotics at this time.  #3 intraparenchymal hemorrhage Secondary to syncopal episode of fall. Patient with no neurological deficits. Hemoglobin currently at 12.6. Repeat CT of the head tomorrow. Will likely need outpatient follow-up with neurosurgery. Per ED note neurosurgery was consulted via phone and this spoke with Dr. Cyndy Freeze who reviewed this CT results and recommended, patient be monitored for neuro checks, outpatient follow-up. Follow.  #4 hyperlipidemia Continue statin.  #5 hypertension Continue Norvasc.  #6 gastroesophageal reflux disease PPI.  #7 urinary incontinence Continue oxybutynin  #8 prophylaxis SCDs for DVT prophylaxis.  Code Status: Full Family Communication: Updated patient and husband at bedside. Disposition Plan: Remain inpatient until syncopal workup was done. Monitor patient with neuro checks.   Consultants:  None  Procedures:  EEG 03/20/2015  Chest x-ray 03/20/2015  X-ray of the right shoulder 03/19/2015  CT head CT C-spine 03/19/2015  Antibiotics:  None  HPI/Subjective: Patient with no further syncopal episodes. No chest pain. No shortness of breath. Patient cannot remember issues surrounding a syncopal episode.  Objective: Filed  Vitals:   03/20/15 0424 03/20/15 0442  BP: 159/90 136/79  Pulse: 74 74  Temp:  98.5 F (36.9 C)  Resp:  18    Intake/Output Summary (Last 24 hours) at 03/20/15 1213 Last data filed at 03/20/15 0855  Gross per 24 hour  Intake    120 ml  Output      0 ml  Net    120 ml   Filed Weights   03/19/15 2355  Weight: 80 kg (176 lb 5.9 oz)    Exam:   General:  NAD. Laceration to the right scalp with staples in place. Dried blood.  Cardiovascular: RRR  Respiratory: CTAB  Abdomen: Soft, nontender, nondistended, positive bowel sounds.  Musculoskeletal: No clubbing cyanosis or edema.   Data Reviewed: Basic Metabolic Panel:  Recent Labs Lab 03/19/15 2036 03/20/15 0240 03/20/15 0913  NA 138  --  138  K 4.3  --  3.5  CL 104  --  103  CO2 25  --  31  GLUCOSE 110*  --  139*  BUN 20  --  13  CREATININE 0.73  --  0.58  CALCIUM 9.1  --  8.8*  MG  --  2.1 2.0  PHOS  --  3.5  --    Liver Function Tests:  Recent Labs Lab 03/20/15 0913  AST 22  ALT 24  ALKPHOS 56  BILITOT 1.1  PROT 6.4*  ALBUMIN 3.8   No results for input(s): LIPASE, AMYLASE in the last 168 hours. No results for input(s): AMMONIA in the last 168 hours. CBC:  Recent Labs Lab 03/19/15 2036 03/20/15 0913  WBC 14.1* 5.7  NEUTROABS 10.8* 3.6  HGB 13.5 12.6  HCT 40.9 37.5  MCV 93.6 92.6  PLT 225 203   Cardiac Enzymes:  Recent Labs Lab 03/20/15 0240 03/20/15 0913  TROPONINI <0.03 <0.03   BNP (  last 3 results) No results for input(s): BNP in the last 8760 hours.  ProBNP (last 3 results) No results for input(s): PROBNP in the last 8760 hours.  CBG: No results for input(s): GLUCAP in the last 168 hours.  No results found for this or any previous visit (from the past 240 hour(s)).   Studies: Dg Chest 2 View  03/20/2015  CLINICAL DATA:  Patient with history of syncope and collapse. EXAM: CHEST  2 VIEW COMPARISON:  Chest radiograph 11/23/2004. FINDINGS: Stable cardiac and mediastinal  contours. No consolidative pulmonary opacities. Minimal platelike atelectasis left lung base. No pleural effusion or pneumothorax. Regional skeleton is unremarkable. Cholecystectomy clips. IMPRESSION: Left basilar atelectasis.  No acute cardiopulmonary process. Electronically Signed   By: Lovey Newcomer M.D.   On: 03/20/2015 09:46   Dg Shoulder Right  03/19/2015  CLINICAL DATA:  69 year old female with fall and trauma to the right shoulder trauma. EXAM: RIGHT SHOULDER - 2+ VIEW COMPARISON:  None. FINDINGS: There is no acute fracture or dislocation. 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 The soft tissues are unremarkable. IMPRESSION: No fracture or dislocation Electronically Signed   By: Anner Crete M.D.   On: 03/19/2015 20:31   Ct Head Wo Contrast  03/19/2015  CLINICAL DATA:  Status post fall.  Head laceration. EXAM: CT HEAD WITHOUT CONTRAST CT CERVICAL SPINE WITHOUT CONTRAST TECHNIQUE: Multidetector CT imaging of the head and cervical spine was performed following the standard protocol without intravenous contrast. Multiplanar CT image reconstructions of the cervical spine were also generated. COMPARISON:  None. FINDINGS: CT HEAD FINDINGS 7 mm hyperdense focus in the right frontal lobe most consistent with a small intraparenchymal hemorrhage with mild surrounding vasogenic edema. There is no evidence of mass effect, midline shift, or extra-axial fluid collections. There is no evidence of a space-occupying lesion. There is no evidence of a cortical-based area of acute infarction. There is generalized cerebral atrophy. There is periventricular white matter low attenuation likely secondary to microangiopathy. The ventricles and sulci are appropriate for the patient's age. The basal cisterns are patent. Visualized portions of the orbits are unremarkable. The visualized portions of the paranasal sinuses and mastoid air cells are  unremarkable. Cerebrovascular atherosclerotic calcifications are noted. The osseous structures are unremarkable. CT CERVICAL SPINE FINDINGS The alignment is anatomic. The vertebral body heights are maintained. There is no acute fracture. There is no static listhesis. The prevertebral soft tissues are normal. The intraspinal soft tissues are not fully imaged on this examination due to poor soft tissue contrast, but there is no gross soft tissue abnormality. There is degenerative disc disease with disc height loss at C5-6 and C6-7 and to a lesser degree C4-5. There is fusion of the left posterior elements at C2-3. There is moderate left facet arthropathy at C3-4. There is moderate right facet arthropathy at C4-5 with bilateral uncovertebral degenerative changes and foraminal narrowing. Bilateral uncovertebral degenerative changes and a broad-based disc osteophyte complex at C5-6 with bilateral foraminal narrowing. Left uncovertebral degenerative changes at C6-7 with left foraminal narrowing. The visualized portions of the lung apices demonstrate no focal abnormality. There is left carotid artery atherosclerosis. IMPRESSION: 1. 7 mm intraparenchymal hemorrhage in the right frontal lobe. 2. No acute osseous injury of the cervical spine. Electronically Signed   By: Kathreen Devoid   On: 03/19/2015 19:50   Ct Cervical Spine Wo Contrast  03/19/2015  CLINICAL DATA:  Status post fall.  Head laceration. EXAM: CT HEAD  WITHOUT CONTRAST CT CERVICAL SPINE WITHOUT CONTRAST TECHNIQUE: Multidetector CT imaging of the head and cervical spine was performed following the standard protocol without intravenous contrast. Multiplanar CT image reconstructions of the cervical spine were also generated. COMPARISON:  None. FINDINGS: CT HEAD FINDINGS 7 mm hyperdense focus in the right frontal lobe most consistent with a small intraparenchymal hemorrhage with mild surrounding vasogenic edema. There is no evidence of mass effect, midline shift,  or extra-axial fluid collections. There is no evidence of a space-occupying lesion. There is no evidence of a cortical-based area of acute infarction. There is generalized cerebral atrophy. There is periventricular white matter low attenuation likely secondary to microangiopathy. The ventricles and sulci are appropriate for the patient's age. The basal cisterns are patent. Visualized portions of the orbits are unremarkable. The visualized portions of the paranasal sinuses and mastoid air cells are unremarkable. Cerebrovascular atherosclerotic calcifications are noted. The osseous structures are unremarkable. CT CERVICAL SPINE FINDINGS The alignment is anatomic. The vertebral body heights are maintained. There is no acute fracture. There is no static listhesis. The prevertebral soft tissues are normal. The intraspinal soft tissues are not fully imaged on this examination due to poor soft tissue contrast, but there is no gross soft tissue abnormality. There is degenerative disc disease with disc height loss at C5-6 and C6-7 and to a lesser degree C4-5. There is fusion of the left posterior elements at C2-3. There is moderate left facet arthropathy at C3-4. There is moderate right facet arthropathy at C4-5 with bilateral uncovertebral degenerative changes and foraminal narrowing. Bilateral uncovertebral degenerative changes and a broad-based disc osteophyte complex at C5-6 with bilateral foraminal narrowing. Left uncovertebral degenerative changes at C6-7 with left foraminal narrowing. The visualized portions of the lung apices demonstrate no focal abnormality. There is left carotid artery atherosclerosis. IMPRESSION: 1. 7 mm intraparenchymal hemorrhage in the right frontal lobe. 2. No acute osseous injury of the cervical spine. Electronically Signed   By: Kathreen Devoid   On: 03/19/2015 19:50    Scheduled Meds: . acyclovir  800 mg Oral 5 X Daily  . amLODipine  2.5 mg Oral Daily  . atorvastatin  10 mg Oral Daily   . cholecalciferol  400 Units Oral Daily  . escitalopram  10 mg Oral Daily  . oxybutynin  15 mg Oral QHS  . pantoprazole  40 mg Oral Daily  . potassium chloride  40 mEq Oral Once  . sodium chloride  3 mL Intravenous Q12H   Continuous Infusions: . sodium chloride      Principal Problem:   Syncope and collapse Active Problems:   HYPERCHOLESTEROLEMIA   Essential hypertension   GERD   URINARY INCONTINENCE   Leukocytosis   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[place])    Time spent: 35 minutes    [facility] MD [facility] Pager [phone]. If 7PM-7AM, please contact night-coverage at [email or web address], password [facility] 03/20/2015, 12:13 PM

## 2015-03-20 NOTE — Progress Notes (Addendum)
Ice pack placed to dorsal R hand for swelling and c/o "throbbing" pain. Dr. Grandville Silos notified via telephone. Ibuprofen given per order (see MAR).   Lind Guest, RN  Pt instructed to keep ice pack on 20 min and off 20 min.

## 2015-03-20 NOTE — Procedures (Signed)
ELECTROENCEPHALOGRAM REPORT   Patient: Jean Bowen      Room #: 81 Age: 69 y.o.        Sex: female Referring Physician: Dr Grandville Silos Report Date:  03/20/2015        Interpreting Physician: Hulen Luster  History: Jean Bowen is an 69 y.o. female with recurrent syncopal episodes  Medications:  I have reviewed the patient's current medications.  Conditions of Recording:  This is a 16 channel EEG carried out with the patient in the awake state.  Description:  The interpretation is technically limited due to excessive frontal muscle activity. Her waking background activity consists of a low voltage, symmetrical, fairly well organized, 8-10 Hz alpha activity, seen from the parieto-occipital and posterior temporal regions. No focal slowing or epileptiform activity is noted.   Hyperventilation was not performed. Intermittent photic stimulation was not performed.   IMPRESSION: Normal electroencephalogram. There are no focal lateralizing or epileptiform features.   Jean Like, DO [facility] [phone]  If 7pm- 7am, please page neurology on call as listed in [place]. 03/20/2015, 4:33 PM

## 2015-03-20 NOTE — Progress Notes (Signed)
EEG completed; results pending.    

## 2015-03-21 ENCOUNTER — Inpatient Hospital Stay (HOSPITAL_COMMUNITY): Payer: Medicare Other

## 2015-03-21 DIAGNOSIS — R55 Syncope and collapse: Secondary | ICD-10-CM

## 2015-03-21 DIAGNOSIS — S06341D Traumatic hemorrhage of right cerebrum with loss of consciousness of 30 minutes or less, subsequent encounter: Secondary | ICD-10-CM

## 2015-03-21 LAB — CBC
HEMATOCRIT: 34.7 % — AB (ref 36.0–46.0)
Hemoglobin: 11.2 g/dL — ABNORMAL LOW (ref 12.0–15.0)
MCH: 30.8 pg (ref 26.0–34.0)
MCHC: 32.3 g/dL (ref 30.0–36.0)
MCV: 95.3 fL (ref 78.0–100.0)
Platelets: 209 10*3/uL (ref 150–400)
RBC: 3.64 MIL/uL — ABNORMAL LOW (ref 3.87–5.11)
RDW: 14.1 % (ref 11.5–15.5)
WBC: 5.5 10*3/uL (ref 4.0–10.5)

## 2015-03-21 LAB — BASIC METABOLIC PANEL
Anion gap: 4 — ABNORMAL LOW (ref 5–15)
BUN: 12 mg/dL (ref 6–20)
CALCIUM: 8.2 mg/dL — AB (ref 8.9–10.3)
CO2: 30 mmol/L (ref 22–32)
CREATININE: 0.53 mg/dL (ref 0.44–1.00)
Chloride: 109 mmol/L (ref 101–111)
GFR calc Af Amer: >60 mL/min (ref >60)
GFR calc non Af Amer: >60 mL/min (ref >60)
GLUCOSE: 104 mg/dL — AB (ref 65–99)
Potassium: 4.8 mmol/L (ref 3.5–5.1)
Sodium: 143 mmol/L (ref 135–145)

## 2015-03-21 LAB — D-DIMER, QUANTITATIVE: D-Dimer, Quant: 0.63 ug/mL-FEU — ABNORMAL HIGH (ref 0.00–0.50)

## 2015-03-21 LAB — URINE CULTURE

## 2015-03-21 MED ORDER — IOHEXOL 350 MG/ML SOLN
100.0000 mL | Freq: Once | INTRAVENOUS | Status: AC | PRN
  Administered 2015-03-21: 100 mL via INTRAVENOUS

## 2015-03-21 NOTE — Progress Notes (Signed)
Pt is discharged home with husband Discharge instructions were reviewed with patient and papers signed.  IV's were removed by RN.  Care plans and education was completed.

## 2015-03-21 NOTE — Progress Notes (Signed)
VASCULAR LAB PRELIMINARY  PRELIMINARY  PRELIMINARY  PRELIMINARY  Carotid duplex  completed.    Preliminary report:  Right:  1-39% ICA stenosis.  Left:  1-39% proximal ICA stenosis.  There are increased velocities in the distal ICA.  Cannot adequately visualize this portion in 2D.  Unable to determine if velocities are due to plaque or bend in vessel.  Bilateral:  Vertebral artery flow is antegrade.     Acy Orsak, RVT 03/21/2015, 9:55 AM

## 2015-03-21 NOTE — Discharge Summary (Signed)
Physician Discharge Summary  Jean Bowen [id number] DOB: 09-03-1945 DOA: 03/19/2015  PCP: Renato Shin, MD  Admit date: 03/19/2015 Discharge date: 03/21/2015  Time spent: 65 minutes  Recommendations for Outpatient Follow-up:  1. Follow-up with Dr. Christella Noa neurosurgery one week to follow-up on intraparenchymal hemorrhage noted on CT had post fall. 2. Follow-up with Renato Shin, MD in 1-2 weeks. On follow-up patient will need a CBC done to follow-up on that H&H.    Discharge Diagnoses:  Principal Problem:   Syncope and collapse Active Problems:   HYPERCHOLESTEROLEMIA   Essential hypertension   GERD   URINARY INCONTINENCE   Leukocytosis   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HCC)   Discharge Condition: Stable and improved.  Diet recommendation: Regular.  Filed Weights   03/19/15 2355  Weight: 80 kg (176 lb 5.9 oz)    History of present illness:  Per Dr Jean Bowen is a 69 y.o. female  with a past medical history of hyperlipidemia, hypertension, urinary incontinence, GERD, osteoporosis, depression, sleep apnea (not on CPAP) who was brought to the emergency department after having a fall complicated by a parietal laceration at home and right shoulder trauma. Per patient, she did not remember falling or hitting her head. She denied chest pain, palpitations, dizziness, diaphoresis, vision changes, headache or nausea at any time prior to falling or in recent weeks prior to this incident.  Her last memory preceeding the fall was of returning home from walking the dog. The next thing she remembered is walking into her house and calling her husband for help after the fall. She did not know for how long she was unconscious, but according to her husband, it could not be more than 2 or 3 minutes, since he was arriving home at the same time. He thinks that not a lot of time passed between the patient returning home from walking the dog and coming inside the house asking for  help.  The patient denied nausea, chest pain, dyspnea, dizziness, palpitations, vision changes, double vision, photophobia or phonophobia after the fall. She stated that her headache was better with acetaminophen and stated that she did not require further intervention for headache at this time.   Hospital Course:  #1 syncope Questionable etiology. Patient couldn't remember events surrounding the syncopal episode. Patient placed on telemetry with no abnormalities noted. Cardiac enzymes negative 2. Carotid Dopplers with no significant ICA stenosis. 2-D echo which was done had a EF of 55-60% with no wall motion abnormalities. No valvular abnormalities noted. EEG was done which was normal with no focal lateralizing or epileptiform features. Orthostatics were checked and patient was noted to be orthostatic by pulse asymptomatic during the hospitalization. Patient had no further syncopal episodes. Patient was placed on IV fluids as well as TED hose. CT angiogram of the chest was done which was negative for PE. Patient be discharged home in stable and improved condition and is to follow-up with PCP as outpatient.   #2 leukocytosis Likely reactive leukocytosis. Repeat CBC with resolution of leukocytosis. Chest x-ray was ordered with no acute infiltrate. Urinalysis nitrite negative leukocytes negative. No need for antibiotics at this time.  #3 intraparenchymal hemorrhage Secondary to syncopal episode of fall. Patient with no neurological deficits. Hemoglobin currently remained stable. Patient did not have any focal neurological deficits. Will likely need outpatient follow-up with neurosurgery. Per ED note neurosurgery was consulted via phone and EDP spoke with Dr. Cyndy Freeze who reviewed this CT results and recommended, patient be monitored for neuro checks, outpatient follow-up. Follow.  #  4 hyperlipidemia Continued on statin.  #5 hypertension Patient was maintained on Norvasc for blood pressure control.    #6 gastroesophageal reflux disease Patient was maintained on a PPI.  #7 urinary incontinence Continued on home regimen of oxybutynin  Procedures:  CT angiogram chest 03/21/2015  2-D echo 03/20/2015  Carotid Dopplers 03/21/2015  EEG 03/20/2015  Consultations:  None  Discharge Exam: Filed Vitals:   03/21/15 1123 03/21/15 1437  BP:  131/71  Pulse:  90  Temp: 97.8 F (36.6 C) 97.9 F (36.6 C)  Resp:  18    General: NAD Cardiovascular: RRR Respiratory: CTAB  Discharge Instructions   Discharge Instructions    Diet general    Complete by:  As directed      Discharge instructions    Complete by:  As directed   Follow up with Renato Shin, MD in 1-2 weeks     Increase activity slowly    Complete by:  As directed           Current Discharge Medication List    CONTINUE these medications which have NOT CHANGED   Details  acetaminophen (TYLENOL) 500 MG tablet Take 500 mg by mouth every 6 (six) hours as needed for moderate pain.     acyclovir (ZOVIRAX) 400 MG tablet TAKE 2 TABS BY MOUTH EVERY 4 HOURS WHILE AWAKE. Qty: 90 tablet, Refills: 3   Associated Diagnoses: Routine general medical examination at a health care facility; Need for prophylactic vaccination and inoculation against influenza; Hypothyroidism, unspecified hypothyroidism type; Pure hypercholesterolemia; Essential hypertension; Osteoporosis; Gastroesophageal reflux disease without esophagitis; Thrombocytopenia ([place]); Hyperparathyroidism ([place])    amLODipine (NORVASC) 2.5 MG tablet TAKE 1 TABLET (2.5 MG TOTAL) BY MOUTH DAILY. Qty: 90 tablet, Refills: 3   Associated Diagnoses: Routine general medical examination at a health care facility; Need for prophylactic vaccination and inoculation against influenza; Hypothyroidism, unspecified hypothyroidism type; Pure hypercholesterolemia; Essential hypertension; Osteoporosis; Gastroesophageal reflux disease without esophagitis; Thrombocytopenia ([place]);  Hyperparathyroidism ([place])    aspirin 81 MG tablet Take 81 mg by mouth daily.      atorvastatin (LIPITOR) 10 MG tablet Take 1 tablet (10 mg total) by mouth daily. Qty: 90 tablet, Refills: 3    cholecalciferol (VITAMIN D-400) 400 UNITS TABS Take 400 Units by mouth daily.      escitalopram (LEXAPRO) 10 MG tablet Take 1 tablet (10 mg total) by mouth daily. Qty: 90 tablet, Refills: 3   Associated Diagnoses: Routine general medical examination at a health care facility; Need for prophylactic vaccination and inoculation against influenza; Hypothyroidism, unspecified hypothyroidism type; Pure hypercholesterolemia; Essential hypertension; Osteoporosis; Gastroesophageal reflux disease without esophagitis; Thrombocytopenia ([place]); Hyperparathyroidism ([place])    lansoprazole (PREVACID) 15 MG capsule Take 15 mg by mouth daily at 12 noon.    omeprazole (PRILOSEC) 40 MG capsule Take 1 capsule (40 mg total) by mouth daily. Qty: 30 capsule, Refills: 11   Associated Diagnoses: Gastroesophageal reflux disease without esophagitis    oxybutynin (DITROPAN) 5 MG tablet Take 1 tablet (5 mg total) by mouth 3 (three) times daily. Qty: 270 tablet, Refills: PRN   Associated Diagnoses: Routine general medical examination at a health care facility; Need for prophylactic vaccination and inoculation against influenza; Hypothyroidism, unspecified hypothyroidism type; Pure hypercholesterolemia; Essential hypertension; Osteoporosis; Gastroesophageal reflux disease without esophagitis; Thrombocytopenia ([place]); Hyperparathyroidism ([place])    hydrOXYzine (ATARAX/VISTARIL) 10 MG tablet Take 1 tablet (10 mg total) by mouth 3 (three) times daily as needed. Qty: 30 tablet, Refills: 0   Associated Diagnoses: Pruritic rash  STOP taking these medications     fluocinonide cream (LIDEX) 0.05 %      permethrin (ACTICIN) 5 % cream        Allergies  Allergen Reactions  . Doxycycline     REACTION: swollen lips and throat  .  Losartan Potassium     REACTION: ? of angioedema  . Morphine And Related     Caused vomiting when given too fast in IV.   Follow-up Information    Follow up with Renato Shin, MD. Go on 03/27/2015.   Specialty:  Endocrinology   Why:  at 9:45am   For Post Hospitalization Follow Up   Contact information:   [address] [place] [address] [phone]       Follow up with CABBELL,KYLE L, MD. Schedule an appointment as soon as possible for a visit in 1 week.   Specialty:  Neurosurgery   Contact information:   [address] [place] [address] [phone]        The results of significant diagnostics from this hospitalization (including imaging, microbiology, ancillary and laboratory) are listed below for reference.    Significant Diagnostic Studies: Dg Chest 2 View  03/20/2015  CLINICAL DATA:  Patient with history of syncope and collapse. EXAM: CHEST  2 VIEW COMPARISON:  Chest radiograph 11/23/2004. FINDINGS: Stable cardiac and mediastinal contours. No consolidative pulmonary opacities. Minimal platelike atelectasis left lung base. No pleural effusion or pneumothorax. Regional skeleton is unremarkable. Cholecystectomy clips. IMPRESSION: Left basilar atelectasis.  No acute cardiopulmonary process. Electronically Signed   By: Lovey Newcomer M.D.   On: 03/20/2015 09:46   Dg Shoulder Right  03/19/2015  CLINICAL DATA:  69 year old female with fall and trauma to the right shoulder trauma. EXAM: RIGHT SHOULDER - 2+ VIEW COMPARISON:  None. FINDINGS: There is no acute fracture or dislocation. 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 The soft tissues are unremarkable. IMPRESSION: No fracture or dislocation Electronically Signed   By: Anner Crete M.D.   On: 03/19/2015 20:31   Ct Head Wo Contrast  03/19/2015  CLINICAL DATA:  Status post fall.  Head laceration. EXAM: CT HEAD WITHOUT  CONTRAST CT CERVICAL SPINE WITHOUT CONTRAST TECHNIQUE: Multidetector CT imaging of the head and cervical spine was performed following the standard protocol without intravenous contrast. Multiplanar CT image reconstructions of the cervical spine were also generated. COMPARISON:  None. FINDINGS: CT HEAD FINDINGS 7 mm hyperdense focus in the right frontal lobe most consistent with a small intraparenchymal hemorrhage with mild surrounding vasogenic edema. There is no evidence of mass effect, midline shift, or extra-axial fluid collections. There is no evidence of a space-occupying lesion. There is no evidence of a cortical-based area of acute infarction. There is generalized cerebral atrophy. There is periventricular white matter low attenuation likely secondary to microangiopathy. The ventricles and sulci are appropriate for the patient's age. The basal cisterns are patent. Visualized portions of the orbits are unremarkable. The visualized portions of the paranasal sinuses and mastoid air cells are unremarkable. Cerebrovascular atherosclerotic calcifications are noted. The osseous structures are unremarkable. CT CERVICAL SPINE FINDINGS The alignment is anatomic. The vertebral body heights are maintained. There is no acute fracture. There is no static listhesis. The prevertebral soft tissues are normal. The intraspinal soft tissues are not fully imaged on this examination due to poor soft tissue contrast, but there is no gross soft tissue abnormality. There is degenerative disc disease with  disc height loss at C5-6 and C6-7 and to a lesser degree C4-5. There is fusion of the left posterior elements at C2-3. There is moderate left facet arthropathy at C3-4. There is moderate right facet arthropathy at C4-5 with bilateral uncovertebral degenerative changes and foraminal narrowing. Bilateral uncovertebral degenerative changes and a broad-based disc osteophyte complex at C5-6 with bilateral foraminal narrowing. Left  uncovertebral degenerative changes at C6-7 with left foraminal narrowing. The visualized portions of the lung apices demonstrate no focal abnormality. There is left carotid artery atherosclerosis. IMPRESSION: 1. 7 mm intraparenchymal hemorrhage in the right frontal lobe. 2. No acute osseous injury of the cervical spine. Electronically Signed   By: Kathreen Devoid   On: 03/19/2015 19:50   Ct Angio Chest Pe W/cm &/or Wo Cm  03/21/2015  CLINICAL DATA:  Syncope since fall 3 days ago with head injury. Patient with blood clotting factors. History of skin cancer. EXAM: CT ANGIOGRAPHY CHEST WITH CONTRAST TECHNIQUE: Multidetector CT imaging of the chest was performed using the standard protocol during bolus administration of intravenous contrast. Multiplanar CT image reconstructions and MIPs were obtained to evaluate the vascular anatomy. CONTRAST:  160mL OMNIPAQUE IOHEXOL 350 MG/ML SOLN COMPARISON:  Previous exams including chest x-ray dated 03/20/2015. FINDINGS: There is no pulmonary embolism seen within the main, lobar, or segmental pulmonary arteries bilaterally. Scattered mild atherosclerotic changes noted along the walls of the normal-caliber thoracic aorta. No aortic aneurysm or dissection. Heart size is normal. No pericardial effusion. Scattered coronary artery calcifications noted. Several small lymph nodes within the mediastinum. No mass or enlarged lymph nodes seen within the mediastinum or perihilar regions. There is minimal scarring/atelectasis at each lung base. Also mild scarring/fibrosis at each lung apex. Lungs otherwise clear. No evidence of pneumonia. No pleural effusion. No pneumothorax. Trachea and central bronchi are unremarkable. Limited images of the upper abdomen are unremarkable. Scattered degenerative changes within the thoracic spine but no acute osseous abnormality. Review of the MIP images confirms the above findings. IMPRESSION: 1. No pulmonary embolism. 2. No thoracic aortic aneurysm or  dissection. 3. Heart size is normal.  No pericardial effusion. 4. Scattered coronary artery calcifications, particularly dense within a focal segment of the left anterior descending coronary artery. Recommend correlation with any possible associated cardiac symptoms. 5. Mild bibasilar scarring and/or atelectasis. Lungs otherwise clear. No evidence of pneumonia. No pleural effusion. Electronically Signed   By: Franki Cabot M.D.   On: 03/21/2015 13:45   Ct Cervical Spine Wo Contrast  03/19/2015  CLINICAL DATA:  Status post fall.  Head laceration. EXAM: CT HEAD WITHOUT CONTRAST CT CERVICAL SPINE WITHOUT CONTRAST TECHNIQUE: Multidetector CT imaging of the head and cervical spine was performed following the standard protocol without intravenous contrast. Multiplanar CT image reconstructions of the cervical spine were also generated. COMPARISON:  None. FINDINGS: CT HEAD FINDINGS 7 mm hyperdense focus in the right frontal lobe most consistent with a small intraparenchymal hemorrhage with mild surrounding vasogenic edema. There is no evidence of mass effect, midline shift, or extra-axial fluid collections. There is no evidence of a space-occupying lesion. There is no evidence of a cortical-based area of acute infarction. There is generalized cerebral atrophy. There is periventricular white matter low attenuation likely secondary to microangiopathy. The ventricles and sulci are appropriate for the patient's age. The basal cisterns are patent. Visualized portions of the orbits are unremarkable. The visualized portions of the paranasal sinuses and mastoid air cells are unremarkable. Cerebrovascular atherosclerotic calcifications are noted. The osseous structures are unremarkable. CT  CERVICAL SPINE FINDINGS The alignment is anatomic. The vertebral body heights are maintained. There is no acute fracture. There is no static listhesis. The prevertebral soft tissues are normal. The intraspinal soft tissues are not fully imaged  on this examination due to poor soft tissue contrast, but there is no gross soft tissue abnormality. There is degenerative disc disease with disc height loss at C5-6 and C6-7 and to a lesser degree C4-5. There is fusion of the left posterior elements at C2-3. There is moderate left facet arthropathy at C3-4. There is moderate right facet arthropathy at C4-5 with bilateral uncovertebral degenerative changes and foraminal narrowing. Bilateral uncovertebral degenerative changes and a broad-based disc osteophyte complex at C5-6 with bilateral foraminal narrowing. Left uncovertebral degenerative changes at C6-7 with left foraminal narrowing. The visualized portions of the lung apices demonstrate no focal abnormality. There is left carotid artery atherosclerosis. IMPRESSION: 1. 7 mm intraparenchymal hemorrhage in the right frontal lobe. 2. No acute osseous injury of the cervical spine. Electronically Signed   By: Kathreen Devoid   On: 03/19/2015 19:50   Dg Bone Density  03/04/2015  Date of study: 02/25/2015 Exam: DUAL X-RAY ABSORPTIOMETRY (DXA) FOR BONE MINERAL DENSITY (BMD) Instrument: [employer] Requesting Provider: Dr. Loanne Drilling Indication: follow up for low BMD Comparison: 05/01/2012 Clinical data: Pt is a postmenopausal 69 y.o. female with previous h/o arm and forearm fracture. On calcium. Previous history of Fosamax and Reclast use. Results:  Lumbar spine L1-L4 (L2, L3) Femoral neck (FN) T-score  -0.6  RFN: -1.9 LFN: -1.8  Change in BMD from previous DXA test (%)  +4.1%   +0.8  (*) statistically significant Assessment: the BMD is low according to the [place] classification for osteoporosis (see below). Fracture risk: moderate FRAX score: 10 year major osteoporotic risk: 17.7 %. 10 year hip fracture risk: 3.1 %. The thresholds for treatment are: 20% and 3%, respectively. Comments: the technical quality of the study is good. Evaluation for secondary causes should be considered if clinically indicated. Recommend  optimizing calcium (1200 mg/day) and vitamin D (800 IU/day) intake. Followup: Repeat BMD is appropriate after 2 years or after 1-2 years if starting treatment. WHO criteria for diagnosis of osteoporosis in postmenopausal women and in men 49 y/o or older: - normal: T-score -1.0 to + 1.0 - osteopenia/low bone density: T-score between -2.5 and -1.0 - osteoporosis: T-score below -2.5 - severe osteoporosis: T-score below -2.5 with history of fragility fracture Note: although not part of the WHO classification, the presence of a fragility fracture, regardless of the T-score, should be considered diagnostic of osteoporosis, provided other causes for the fracture have been excluded. Treatment: The National Osteoporosis Foundation recommends that treatment be considered in postmenopausal women and men age 23 or older with: 1. Hip or vertebral (clinical or morphometric) fracture 2. T-score of - 2.5 or lower at the spine or hip 3. 10-year fracture probability by FRAX of at least 20% for a major osteoporotic fracture and 3% for a hip fracture Philemon Kingdom, MD [facility]    Microbiology: Recent Results (from the past 240 hour(s))  Culture, Urine     Status: None   Collection Time: 03/20/15 10:03 AM  Result Value Ref Range Status   Specimen Description URINE, CLEAN CATCH  Final   Special Requests NONE  Final   Culture   Final    MULTIPLE SPECIES PRESENT, SUGGEST RECOLLECTION Performed at [facility]    Report Status 03/21/2015 FINAL  Final     Labs:  Basic Metabolic Panel:  Recent Labs Lab 03/19/15 2036 03/20/15 0240 03/20/15 0913 03/21/15 0440  NA 138  --  138 143  K 4.3  --  3.5 4.8  CL 104  --  103 109  CO2 25  --  31 30  GLUCOSE 110*  --  139* 104*  BUN 20  --  13 12  CREATININE 0.73  --  0.58 0.53  CALCIUM 9.1  --  8.8* 8.2*  MG  --  2.1 2.0  --   PHOS  --  3.5  --   --    Liver Function Tests:  Recent Labs Lab 03/20/15 0913  AST 22  ALT 24  ALKPHOS 56   BILITOT 1.1  PROT 6.4*  ALBUMIN 3.8   No results for input(s): LIPASE, AMYLASE in the last 168 hours. No results for input(s): AMMONIA in the last 168 hours. CBC:  Recent Labs Lab 03/19/15 2036 03/20/15 0913 03/21/15 0440  WBC 14.1* 5.7 5.5  NEUTROABS 10.8* 3.6  --   HGB 13.5 12.6 11.2*  HCT 40.9 37.5 34.7*  MCV 93.6 92.6 95.3  PLT 225 203 209   Cardiac Enzymes:  Recent Labs Lab 03/20/15 0240 03/20/15 0913  TROPONINI <0.03 <0.03   BNP: BNP (last 3 results) No results for input(s): BNP in the last 8760 hours.  ProBNP (last 3 results) No results for input(s): PROBNP in the last 8760 hours.  CBG: No results for input(s): GLUCAP in the last 168 hours.     SignedIrine Seal MD [facility] 03/21/2015, 3:43 PM

## 2015-03-27 ENCOUNTER — Ambulatory Visit (INDEPENDENT_AMBULATORY_CARE_PROVIDER_SITE_OTHER): Payer: Medicare Other | Admitting: Endocrinology

## 2015-03-27 ENCOUNTER — Encounter: Payer: Self-pay | Admitting: Endocrinology

## 2015-03-27 VITALS — BP 112/84 | HR 72 | Temp 99.0°F | Ht 67.0 in | Wt 179.0 lb

## 2015-03-27 DIAGNOSIS — S06341D Traumatic hemorrhage of right cerebrum with loss of consciousness of 30 minutes or less, subsequent encounter: Secondary | ICD-10-CM

## 2015-03-27 DIAGNOSIS — D649 Anemia, unspecified: Secondary | ICD-10-CM

## 2015-03-27 LAB — CBC WITH DIFFERENTIAL/PLATELET
BASOS ABS: 0 10*3/uL (ref 0.0–0.1)
Basophils Relative: 0.5 % (ref 0.0–3.0)
EOS ABS: 0.2 10*3/uL (ref 0.0–0.7)
Eosinophils Relative: 3.2 % (ref 0.0–5.0)
HCT: 38.2 % (ref 36.0–46.0)
Hemoglobin: 12.7 g/dL (ref 12.0–15.0)
LYMPHS ABS: 1.5 10*3/uL (ref 0.7–4.0)
Lymphocytes Relative: 27.8 % (ref 12.0–46.0)
MCHC: 33.3 g/dL (ref 30.0–36.0)
MCV: 91.9 fl (ref 78.0–100.0)
MONO ABS: 0.7 10*3/uL (ref 0.1–1.0)
Monocytes Relative: 13.8 % — ABNORMAL HIGH (ref 3.0–12.0)
NEUTROS ABS: 2.9 10*3/uL (ref 1.4–7.7)
NEUTROS PCT: 54.7 % (ref 43.0–77.0)
PLATELETS: 321 10*3/uL (ref 150.0–400.0)
RBC: 4.16 Mil/uL (ref 3.87–5.11)
RDW: 14.4 % (ref 11.5–15.5)
WBC: 5.3 10*3/uL (ref 4.0–10.5)

## 2015-03-27 LAB — IBC PANEL
IRON: 76 ug/dL (ref 42–145)
SATURATION RATIOS: 19.5 % — AB (ref 20.0–50.0)
Transferrin: 278 mg/dL (ref 212.0–360.0)

## 2015-03-27 NOTE — Patient Instructions (Signed)
Please see neurosurgery soon.   blood tests are requested for you today.  We'll let you know about the results.

## 2015-03-27 NOTE — Progress Notes (Signed)
Subjective:    Patient ID: Jean Bowen, female    DOB: 04/15/1946, 69 y.o.   MRN: [id number]  HPI Last week, pt fell at home while cleaning gutters.  She struck her right thigh and right parietal area.  She in the hospital, and feels much better now.   anemia was noted in the hospital Past Medical History  Diagnosis Date  . URINARY INCONTINENCE 12/03/2006  . OSTEOPOROSIS 12/03/2006  . HYPERTENSION 12/03/2006  . HYPERCHOLESTEROLEMIA 02/23/2010  . GERD 03/03/2007  . DEPRESSION 12/03/2006  . Hyperplastic colon polyp 12/03/2006  . ANGIOEDEMA 08/22/09  . DERMATITIS, PERIORAL 01/08/2010  . History of benign gastric tumor     significant for a benign gastric tumor that weighed approximately 5 pounds and was resected.  . OSA (obstructive sleep apnea)   . Hiatal hernia   . Diverticulosis   . Sleep apnea     Past Surgical History  Procedure Laterality Date  . Cholecystectomy    . Knee arthroscopy      Bilateral-torn meniscus  . Esophagogastroduodenoscopy  11/17/2006  . Stress cardiolite  02/14/2006  . Tumor removal  2003    removal 5 lb benign tumor from abdomen  . 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 Bunionectomy      both feet  . Electrocardiogram  02/07/2006  . Hx of nasal septal reconstruction for deviated septum  1980's  . Cataract extraction      Bil    Social History   Social History  . Marital Status: Married    Spouse Name: N/A  . Number of Children: N/A  . Years of Education: N/A   Occupational History  . 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 Smoking status: Former Smoker    Types: Cigarettes    Quit date: 05/22/2001  . Smokeless tobacco: Never Used  . Alcohol Use: 1.8 oz/week    3 Glasses of wine per week     Comment: 1-2/week  . Drug Use: No  . Sexual Activity: Not on file   Other Topics Concern  . Not on file   Social History Narrative   1 dog   Regular exercise-yes           Current Outpatient Prescriptions on File Prior to Visit  Medication Sig Dispense Refill  . acetaminophen (TYLENOL) 500 MG tablet Take 500 mg by mouth every 6 (six) hours as needed for moderate pain.     Marland Kitchen acyclovir (ZOVIRAX) 400 MG tablet TAKE 2 TABS BY MOUTH EVERY 4 HOURS WHILE AWAKE. 90 tablet 3  . amLODipine (NORVASC) 2.5 MG tablet TAKE 1 TABLET (2.5 MG TOTAL) BY MOUTH DAILY. 90 tablet 3  . atorvastatin (LIPITOR) 10 MG tablet Take 1 tablet (10 mg total) by mouth daily. 90 tablet 3  . cholecalciferol (VITAMIN D-400) 400 UNITS TABS Take 400 Units by mouth daily.      Marland Kitchen escitalopram (LEXAPRO) 10 MG tablet Take 1 tablet (10 mg total) by mouth daily. 90 tablet 3  . hydrOXYzine (ATARAX/VISTARIL) 10 MG tablet Take 1 tablet (10 mg total) by mouth 3 (three) times daily as needed. 30 tablet 0  . lansoprazole (PREVACID) 15 MG capsule Take 15 mg by mouth daily at 12 noon.    Marland Kitchen omeprazole (PRILOSEC) 40 MG capsule Take 1 capsule (40 mg total) by mouth daily. 30 capsule 11  . oxybutynin (DITROPAN) 5 MG tablet Take 1 tablet (  5 mg total) by mouth 3 (three) times daily. 270 tablet PRN  . aspirin 81 MG tablet Take 81 mg by mouth daily.       No current facility-administered medications on file prior to visit.    Allergies  Allergen Reactions  . Doxycycline     REACTION: swollen lips and throat  . Losartan Potassium     REACTION: ? of angioedema  . Morphine And Related     Caused vomiting when given too fast in IV.    Family History  Problem Relation Age of Onset  . Melanoma Mother   . Lung cancer Mother   . Colon cancer Father     at advanced age  . Arthritis Father   . Lung cancer Brother   . Heart attack      grandfather  . Diabetes      grandfather/grandmother    BP 112/84 mmHg  Pulse 72  Temp(Src) 99 F (37.2 C) (Oral)  Ht 5\' 7"  (1.702 m)  Wt 179 lb (81.194 kg)  BMI 28.03 kg/m2  SpO2 92%  Review of Systems She now has just minimal headache.      Objective:   Physical  Exam VITAL SIGNS:  See vs page GENERAL: no distress. Skin: severe ecchymosis of the right thigh.   Gait: normal and steady.      Assessment & Plan:  Fall with ICH: clinically much better Anemia: due for recheck  Patient is advised the following: Patient Instructions  Please see neurosurgery soon.   blood tests are requested for you today.  We'll let you know about the results.

## 2015-04-02 ENCOUNTER — Telehealth: Payer: Self-pay | Admitting: Endocrinology

## 2015-04-02 ENCOUNTER — Ambulatory Visit
Admission: RE | Admit: 2015-04-02 | Discharge: 2015-04-02 | Disposition: A | Discharge status: Home / Self Care | Payer: Medicare Other | Source: Ambulatory Visit

## 2015-04-02 DIAGNOSIS — S06341D Traumatic hemorrhage of right cerebrum with loss of consciousness of 30 minutes or less, subsequent encounter: Secondary | ICD-10-CM

## 2015-04-02 DIAGNOSIS — Z1231 Encounter for screening mammogram for malignant neoplasm of breast: Secondary | ICD-10-CM | POA: Diagnosis not present

## 2015-04-02 NOTE — Telephone Encounter (Signed)
Requested a call back from the pt.  

## 2015-04-02 NOTE — Telephone Encounter (Signed)
Patient ask you to call her. °

## 2015-04-02 NOTE — Telephone Encounter (Signed)
done

## 2015-04-02 NOTE — Telephone Encounter (Signed)
Pt called about a recommendation to a Neurosurgeon's office.  Pt went to [facility] and waited for over 2 hours and was never seen. Pt stated she would not go back and wanted to know if we could recommend another office. Please advise, Thanks!

## 2015-04-03 NOTE — Telephone Encounter (Signed)
I contacted the pt and advised she would need to contact the neurosurgeons office in [place]. Pt voiced understanding.

## 2015-04-06 ENCOUNTER — Encounter (HOSPITAL_COMMUNITY): Payer: Self-pay | Admitting: Emergency Medicine

## 2015-04-06 ENCOUNTER — Emergency Department (INDEPENDENT_AMBULATORY_CARE_PROVIDER_SITE_OTHER)
Admission: EM | Admit: 2015-04-06 | Discharge: 2015-04-06 | Disposition: A | Discharge status: Home / Self Care | Payer: Medicare Other | Source: Home / Self Care | Attending: Emergency Medicine | Admitting: Emergency Medicine

## 2015-04-06 DIAGNOSIS — Z4802 Encounter for removal of sutures: Secondary | ICD-10-CM | POA: Diagnosis not present

## 2015-04-06 NOTE — ED Provider Notes (Signed)
CSN: [id number]     Arrival date & time 04/06/15  1301 History   First MD Initiated Contact with Patient 04/06/15 1326     Chief Complaint  Patient presents with  . Suture / Staple Removal   (Consider location/radiation/quality/duration/timing/severity/associated sxs/prior Treatment) HPI  She is a 69 year old woman here for staple removal. She had a fall November 30 that resulted in a laceration to her right scalp. She was seen in the emergency room at that time it was repaired with staples. She was admitted as she did not remember the fall. She states currently she is doing well. She has not seen the neurosurgeon yet. She denies any focal neurologic symptoms. She states the wound has been healing well. It does itch a little bit.  Past Medical History  Diagnosis Date  . URINARY INCONTINENCE 12/03/2006  . OSTEOPOROSIS 12/03/2006  . HYPERTENSION 12/03/2006  . HYPERCHOLESTEROLEMIA 02/23/2010  . GERD 03/03/2007  . DEPRESSION 12/03/2006  . Hyperplastic colon polyp 12/03/2006  . ANGIOEDEMA 08/22/09  . DERMATITIS, PERIORAL 01/08/2010  . History of benign gastric tumor     significant for a benign gastric tumor that weighed approximately 5 pounds and was resected.  . OSA (obstructive sleep apnea)   . Hiatal hernia   . Diverticulosis   . Sleep apnea    Past Surgical History  Procedure Laterality Date  . Cholecystectomy    . Knee arthroscopy      Bilateral-torn meniscus  . Esophagogastroduodenoscopy  11/17/2006  . Stress cardiolite  02/14/2006  . Tumor removal  2003    removal 5 lb benign tumor from abdomen  . 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 Bunionectomy      both feet  . Electrocardiogram  02/07/2006  . Hx of nasal septal reconstruction for deviated septum  1980's  . Cataract extraction      Bil   Family History  Problem Relation Age of Onset  . Melanoma Mother   . Lung cancer Mother   . Colon cancer Father     at advanced age  . Arthritis  Father   . Lung cancer Brother   . Heart attack      grandfather  . Diabetes      grandfather/grandmother   Social History  Substance Use Topics  . Smoking status: Former Smoker    Types: Cigarettes    Quit date: 05/22/2001  . Smokeless tobacco: Never Used  . Alcohol Use: 1.8 oz/week    3 Glasses of wine per week     Comment: 1-2/week   OB History    No data available     Review of Systems As in history of present illness Allergies  Doxycycline; Losartan potassium; and Morphine and related  Home Medications   Prior to Admission medications   Medication Sig Start Date End Date Taking? Authorizing Provider  acetaminophen (TYLENOL) 500 MG tablet Take 500 mg by mouth every 6 (six) hours as needed for moderate pain.     Historical Provider, MD  acyclovir (ZOVIRAX) 400 MG tablet TAKE 2 TABS BY MOUTH EVERY 4 HOURS WHILE AWAKE. 02/11/15   Renato Shin, MD  amLODipine (NORVASC) 2.5 MG tablet TAKE 1 TABLET (2.5 MG TOTAL) BY MOUTH DAILY. 02/11/15   Renato Shin, MD  aspirin 81 MG tablet Take 81 mg by mouth daily.      Historical Provider, MD  atorvastatin (LIPITOR) 10 MG tablet Take 1 tablet (10 mg total) by  mouth daily. 02/14/15   Renato Shin, MD  cholecalciferol (VITAMIN D-400) 400 UNITS TABS Take 400 Units by mouth daily.      Historical Provider, MD  escitalopram (LEXAPRO) 10 MG tablet Take 1 tablet (10 mg total) by mouth daily. 02/11/15   Renato Shin, MD  hydrOXYzine (ATARAX/VISTARIL) 10 MG tablet Take 1 tablet (10 mg total) by mouth 3 (three) times daily as needed. 11/23/14   Tereasa Coop, PA-C  lansoprazole (PREVACID) 15 MG capsule Take 15 mg by mouth daily at 12 noon.    Historical Provider, MD  omeprazole (PRILOSEC) 40 MG capsule Take 1 capsule (40 mg total) by mouth daily. 10/16/14   Lafayette Dragon, MD  oxybutynin (DITROPAN) 5 MG tablet Take 1 tablet (5 mg total) by mouth 3 (three) times daily. 02/11/15 02/11/16  Renato Shin, MD   Meds Ordered and Administered this Visit   Medications - No data to display  BP 168/95 mmHg  Pulse 76  Temp(Src) 98.1 F (36.7 C) (Oral)  Resp 20  SpO2 97% No data found.   Physical Exam  Constitutional: She is oriented to person, place, and time. She appears well-developed and well-nourished. No distress.  Cardiovascular: Normal rate.   Pulmonary/Chest: Effort normal.  Neurological: She is alert and oriented to person, place, and time.  Skin:  Healing laceration to right scalp. There is significant scabbing present. No surrounding erythema or drainage. 7 staples visualized.    ED Course  .Suture Removal Date/Time: 04/06/2015 1:36 PM Performed by: Melony Overly Authorized by: Melony Overly Consent: Verbal consent obtained. Risks and benefits: risks, benefits and alternatives were discussed Consent given by: patient Patient understanding: patient states understanding of the procedure being performed Patient identity confirmed: verbally with patient Time out: Immediately prior to procedure a "time out" was called to verify the correct patient, procedure, equipment, support staff and site/side marked as required. Body area: head/neck Location details: scalp Wound Appearance: clean Staples Removed: 7 Patient tolerance: Patient tolerated the procedure well with no immediate complications   (including critical care time)  Labs Review Labs Reviewed - No data to display  Imaging Review No results found.   MDM   1. Visit for suture removal    7 staples removed. Wound care discussed.    Melony Overly, MD 04/06/15 [phone]

## 2015-04-06 NOTE — Discharge Instructions (Signed)
I removed the staples. Please do not pick at the scab. This will come off on its own in the next days to week. Follow-up as needed.

## 2015-04-06 NOTE — ED Notes (Signed)
Patient is here for staple removal.  Staples placed in right scalp.

## 2015-06-23 ENCOUNTER — Telehealth: Payer: Self-pay | Admitting: Endocrinology

## 2015-06-23 NOTE — Telephone Encounter (Signed)
I contacted the pt. She needed a refill on her husbands PTU. Rx submitted .

## 2015-06-23 NOTE — Telephone Encounter (Signed)
Pt requests call back from you, did not specify why

## 2016-02-24 ENCOUNTER — Other Ambulatory Visit: Payer: Self-pay | Admitting: Endocrinology

## 2016-02-24 DIAGNOSIS — Z1231 Encounter for screening mammogram for malignant neoplasm of breast: Secondary | ICD-10-CM

## 2016-03-01 ENCOUNTER — Ambulatory Visit (INDEPENDENT_AMBULATORY_CARE_PROVIDER_SITE_OTHER): Payer: Medicare Other

## 2016-03-01 DIAGNOSIS — Z23 Encounter for immunization: Secondary | ICD-10-CM

## 2016-03-02 DIAGNOSIS — Z85828 Personal history of other malignant neoplasm of skin: Secondary | ICD-10-CM | POA: Diagnosis not present

## 2016-03-02 DIAGNOSIS — L570 Actinic keratosis: Secondary | ICD-10-CM | POA: Diagnosis not present

## 2016-03-02 DIAGNOSIS — L821 Other seborrheic keratosis: Secondary | ICD-10-CM | POA: Diagnosis not present

## 2016-03-02 DIAGNOSIS — L308 Other specified dermatitis: Secondary | ICD-10-CM | POA: Diagnosis not present

## 2016-03-02 DIAGNOSIS — L812 Freckles: Secondary | ICD-10-CM | POA: Diagnosis not present

## 2016-03-14 NOTE — Progress Notes (Signed)
Subjective:    Patient ID: Jean Bowen, female    DOB: Apr 29, 1945, 70 y.o.   MRN: [id number]  HPI  The state of at least three ongoing medical problems is addressed today, with interval history of each noted here:  Dyslipidemia: she denies chest pain HTN: she denies sob Depression: she has mild insomnia Past Medical History:  Diagnosis Date  . ANGIOEDEMA 08/22/09  . DEPRESSION 12/03/2006  . DERMATITIS, PERIORAL 01/08/2010  . Diverticulosis   . GERD 03/03/2007  . Hiatal hernia   . History of benign gastric tumor    significant for a benign gastric tumor that weighed approximately 5 pounds and was resected.  Marland Kitchen HYPERCHOLESTEROLEMIA 02/23/2010  . Hyperplastic colon polyp 12/03/2006  . HYPERTENSION 12/03/2006  . OSA (obstructive sleep apnea)   . OSTEOPOROSIS 12/03/2006  . Sleep apnea   . URINARY INCONTINENCE 12/03/2006    Past Surgical History:  Procedure Laterality Date  . BUNIONECTOMY     both feet  . CATARACT EXTRACTION     Bil  . CHOLECYSTECTOMY    . ELECTROCARDIOGRAM  02/07/2006  . ESOPHAGOGASTRODUODENOSCOPY  11/17/2006  . Hx of nasal septal reconstruction for deviated septum  1980's  . KNEE ARTHROSCOPY     Bilateral-torn meniscus  . 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 Stress Cardiolite  02/14/2006  . TUMOR REMOVAL  2003   removal 5 lb benign tumor from abdomen    Social History   Social History  . Marital status: Married    Spouse name: N/A  . Number of children: N/A  . Years of education: N/A   Occupational History  . 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 Smoking status: Former Smoker    Types: Cigarettes    Quit date: 05/22/2001  . Smokeless tobacco: Never Used  . Alcohol use 1.8 oz/week    3 Glasses of wine per week     Comment: 1-2/week  . Drug use: No  . Sexual activity: Not on file   Other Topics Concern  . Not on file   Social History Narrative   1 dog   Regular exercise-yes           Current Outpatient Prescriptions on File Prior to Visit  Medication Sig Dispense Refill  . acyclovir (ZOVIRAX) 400 MG tablet TAKE 2 TABS BY MOUTH EVERY 4 HOURS WHILE AWAKE. 90 tablet 2  . amLODipine (NORVASC) 2.5 MG tablet TAKE 1 TABLET (2.5 MG TOTAL) BY MOUTH DAILY. 90 tablet 2  . aspirin 81 MG tablet Take 81 mg by mouth daily.      Marland Kitchen atorvastatin (LIPITOR) 10 MG tablet TAKE 1 TABLET (10 MG TOTAL) BY MOUTH DAILY. 90 tablet 2  . cholecalciferol (VITAMIN D-400) 400 UNITS TABS Take 400 Units by mouth daily.      Marland Kitchen escitalopram (LEXAPRO) 10 MG tablet TAKE 1 TABLET (10 MG TOTAL) BY MOUTH DAILY. 90 tablet 2  . omeprazole (PRILOSEC) 40 MG capsule Take 1 capsule (40 mg total) by mouth daily. 30 capsule 11  . oxybutynin (DITROPAN) 5 MG tablet TAKE 1 TABLET (5 MG TOTAL) BY MOUTH 3 (THREE) TIMES DAILY. 270 tablet PRN  . hydrOXYzine (ATARAX/VISTARIL) 10 MG tablet Take 1 tablet (10 mg total) by mouth 3 (three) times daily as needed. 30 tablet 0   No current facility-administered medications on file prior to visit.     Allergies  Allergen Reactions  . Doxycycline  REACTION: swollen lips and throat  . Losartan Potassium     REACTION: ? of angioedema  . Morphine And Related     Caused vomiting when given too fast in IV.    Family History  Problem Relation Age of Onset  . Melanoma Mother   . Lung cancer Mother   . Colon cancer Father     at advanced age  . Arthritis Father   . Lung cancer Brother   . Heart attack      grandfather  . Diabetes      grandfather/grandmother    BP 139/80   Pulse 64   Ht 5\' 7"  (1.702 m)   Wt 182 lb (82.6 kg)   SpO2 96%   BMI 28.51 kg/m   Review of Systems Denies BRBPR and hematuria    Objective:   Physical Exam VS: see vs page GEN: no distress HEAD: head: no deformity eyes: no periorbital swelling, no proptosis external nose and ears are normal mouth: no lesion seen NECK: supple, thyroid is not enlarged CHEST WALL: no  deformity LUNGS: clear to auscultation CV: reg rate and rhythm, no murmur ABD: abdomen is soft, nontender.  no hepatosplenomegaly.  not distended.  no hernia MUSCULOSKELETAL: muscle bulk and strength are grossly normal.  no obvious joint swelling.  gait is normal and steady EXTEMITIES: no deformity.  no edema PULSES: no carotid bruit NEURO:  cn 2-12 grossly intact.   readily moves all 4's.  sensation is intact to touch on all 4's SKIN:  Normal texture and temperature.  No rash or suspicious lesion is visible.   NODES:  None palpable at the neck PSYCH: alert, well-oriented.  Does not appear anxious nor depressed.  Lab Results  Component Value Date   CHOL 227 (H) 02/11/2015   HDL 42.10 02/11/2015   LDLCALC 121 (H) 03/06/2012   LDLDIRECT 67.0 02/11/2015   TRIG 336.0 (H) 02/11/2015   CHOLHDL 5 02/11/2015       Assessment & Plan:  HTN: well-controlled Depression: well-controlled Dyslipidemia: recheck today Please continue the same medications.   Subjective:   Patient here for Medicare annual wellness visit and management of other chronic and acute problems.     Risk factors: advanced age    24 of Physicians Providing Medical Care to Patient:  See "snapshot"   Activities of Daily Living: In your present state of health, do you have any difficulty performing the following activities (lives with husband)?:  Preparing food and eating?: No  Bathing yourself: No  Getting dressed: No  Using the toilet:No  Moving around from place to place: No  In the past year have you fallen or had a near fall?: No    Home Safety: Has smoke detector and wears seat belts. Firearms are safely stored. No excess sun exposure.   Diet and Exercise  Current exercise habits: pt says limited by need to care for husband. Dietary issues discussed: pt reports a healthy diet.    Depression Screen  Q1: Over the past two weeks, have you felt down, depressed or hopeless? no  Q2: Over the past two  weeks, have you felt little interest or pleasure in doing things? no   The following portions of the patient's history were reviewed and updated as appropriate: allergies, current medications, past family history, past medical history, past social history, past surgical history and problem list.   Review of Systems  Denies hearing loss, and visual loss.  Objective:   Vision:  Advertising account executive, so  she declines VA today Hearing: grossly normal Body mass index:  See vs page Msk: pt easily and quickly performs "get-up-and-go" from a sitting position Cognitive Impairment Assessment: cognition, memory and judgment appear normal.  remembers 3/3 at 5 minutes.  excellent recall.  can easily read and write a sentence.  alert and oriented x 3.    Assessment:   Medicare wellness utd on preventive parameters    Plan:   During the course of the visit the patient was educated and counseled about appropriate screening and preventive services including:       Fall prevention   Screening mammography is UTD Bone densitometry screening is UTD Diabetes screening  Nutrition counseling   Vaccines / LABS Zostavax / Pneumococcal Vaccine today  Patient Instructions (the written plan) was given to the patient.

## 2016-03-15 ENCOUNTER — Other Ambulatory Visit: Payer: Self-pay | Admitting: Endocrinology

## 2016-03-15 DIAGNOSIS — I10 Essential (primary) hypertension: Secondary | ICD-10-CM

## 2016-03-15 DIAGNOSIS — E7800 Pure hypercholesterolemia, unspecified: Secondary | ICD-10-CM

## 2016-03-15 DIAGNOSIS — E039 Hypothyroidism, unspecified: Secondary | ICD-10-CM

## 2016-03-15 DIAGNOSIS — Z0000 Encounter for general adult medical examination without abnormal findings: Secondary | ICD-10-CM

## 2016-03-15 DIAGNOSIS — M810 Age-related osteoporosis without current pathological fracture: Secondary | ICD-10-CM

## 2016-03-15 DIAGNOSIS — Z23 Encounter for immunization: Secondary | ICD-10-CM

## 2016-03-15 DIAGNOSIS — D696 Thrombocytopenia, unspecified: Secondary | ICD-10-CM

## 2016-03-15 DIAGNOSIS — K219 Gastro-esophageal reflux disease without esophagitis: Secondary | ICD-10-CM

## 2016-03-15 DIAGNOSIS — E213 Hyperparathyroidism, unspecified: Secondary | ICD-10-CM

## 2016-03-17 ENCOUNTER — Encounter: Payer: Self-pay | Admitting: Endocrinology

## 2016-03-17 ENCOUNTER — Ambulatory Visit (INDEPENDENT_AMBULATORY_CARE_PROVIDER_SITE_OTHER): Payer: Medicare Other | Admitting: Endocrinology

## 2016-03-17 VITALS — BP 139/80 | HR 64 | Ht 67.0 in | Wt 182.0 lb

## 2016-03-17 DIAGNOSIS — Z23 Encounter for immunization: Secondary | ICD-10-CM

## 2016-03-17 DIAGNOSIS — E7800 Pure hypercholesterolemia, unspecified: Secondary | ICD-10-CM

## 2016-03-17 DIAGNOSIS — D696 Thrombocytopenia, unspecified: Secondary | ICD-10-CM | POA: Diagnosis not present

## 2016-03-17 DIAGNOSIS — E213 Hyperparathyroidism, unspecified: Secondary | ICD-10-CM | POA: Diagnosis not present

## 2016-03-17 DIAGNOSIS — I10 Essential (primary) hypertension: Secondary | ICD-10-CM

## 2016-03-17 DIAGNOSIS — Z0000 Encounter for general adult medical examination without abnormal findings: Secondary | ICD-10-CM | POA: Diagnosis not present

## 2016-03-17 DIAGNOSIS — Z119 Encounter for screening for infectious and parasitic diseases, unspecified: Secondary | ICD-10-CM

## 2016-03-17 NOTE — Progress Notes (Signed)
we discussed code status.  pt requests full code, but would not want to be started or maintained on artificial life-support measures if there was not a reasonable chance of recovery 

## 2016-03-17 NOTE — Patient Instructions (Addendum)
Please consider these measures for your health:  minimize alcohol.  Do not use tobacco products.  Have a colonoscopy at least every 10 years from age 71.  Women should have an annual mammogram from age 48.  Keep firearms safely stored.  Always use seat belts.  have working smoke alarms in your home.  See an eye doctor and dentist regularly.  Never drive under the influence of alcohol or drugs (including prescription drugs).  Those with fair skin should take precautions against the sun, and should carefully examine their skin once per month, for any new or changed moles. good diet and exercise significantly improve your health.  please let me know if you wish to be referred to a dietician.  high blood sugar is very risky to your health.  you should see an eye doctor and dentist every year.  It is very important to get all recommended vaccinations.  It is critically important to prevent falling down (keep floor areas well-lit, dry, and free of loose objects.  If you have a cane, walker, or wheelchair, you should use it, even for short trips around the house.  Wear flat-soled shoes.  Also, try not to rush).   Please return in 1 year.

## 2016-04-02 ENCOUNTER — Ambulatory Visit
Admission: RE | Admit: 2016-04-02 | Discharge: 2016-04-02 | Disposition: A | Discharge status: Home / Self Care | Payer: Medicare Other | Source: Ambulatory Visit | Attending: Endocrinology | Admitting: Endocrinology

## 2016-04-02 DIAGNOSIS — Z1231 Encounter for screening mammogram for malignant neoplasm of breast: Secondary | ICD-10-CM | POA: Diagnosis not present

## 2016-04-20 DIAGNOSIS — H04123 Dry eye syndrome of bilateral lacrimal glands: Secondary | ICD-10-CM | POA: Diagnosis not present

## 2016-04-20 DIAGNOSIS — Z961 Presence of intraocular lens: Secondary | ICD-10-CM | POA: Diagnosis not present

## 2016-04-20 DIAGNOSIS — H43392 Other vitreous opacities, left eye: Secondary | ICD-10-CM | POA: Diagnosis not present

## 2016-04-20 DIAGNOSIS — H43811 Vitreous degeneration, right eye: Secondary | ICD-10-CM | POA: Diagnosis not present

## 2016-05-31 ENCOUNTER — Other Ambulatory Visit: Payer: Medicare Other

## 2016-06-01 ENCOUNTER — Other Ambulatory Visit: Payer: Medicare Other

## 2016-06-11 ENCOUNTER — Other Ambulatory Visit (INDEPENDENT_AMBULATORY_CARE_PROVIDER_SITE_OTHER): Payer: Medicare Other

## 2016-06-11 DIAGNOSIS — E7800 Pure hypercholesterolemia, unspecified: Secondary | ICD-10-CM | POA: Diagnosis not present

## 2016-06-11 DIAGNOSIS — I10 Essential (primary) hypertension: Secondary | ICD-10-CM

## 2016-06-11 DIAGNOSIS — R7989 Other specified abnormal findings of blood chemistry: Secondary | ICD-10-CM

## 2016-06-11 DIAGNOSIS — D696 Thrombocytopenia, unspecified: Secondary | ICD-10-CM | POA: Diagnosis not present

## 2016-06-11 DIAGNOSIS — E213 Hyperparathyroidism, unspecified: Secondary | ICD-10-CM | POA: Diagnosis not present

## 2016-06-11 DIAGNOSIS — Z119 Encounter for screening for infectious and parasitic diseases, unspecified: Secondary | ICD-10-CM

## 2016-06-11 LAB — BASIC METABOLIC PANEL
BUN: 17 mg/dL (ref 6–23)
CALCIUM: 9.4 mg/dL (ref 8.4–10.5)
CO2: 32 meq/L (ref 19–32)
CREATININE: 0.62 mg/dL (ref 0.40–1.20)
Chloride: 104 mEq/L (ref 96–112)
GFR: 100.77 mL/min (ref >60.00)
Glucose, Bld: 96 mg/dL (ref 70–99)
Potassium: 4 mEq/L (ref 3.5–5.1)
SODIUM: 141 meq/L (ref 135–145)

## 2016-06-11 LAB — LIPID PANEL
CHOL/HDL RATIO: 3
Cholesterol: 149 mg/dL (ref 0–200)
HDL: 57.8 mg/dL (ref >39.00)
NONHDL: 91.45
Triglycerides: 263 mg/dL — ABNORMAL HIGH (ref 0.0–149.0)
VLDL: 52.6 mg/dL — AB (ref 0.0–40.0)

## 2016-06-11 LAB — HEPATIC FUNCTION PANEL
ALK PHOS: 69 U/L (ref 39–117)
ALT: 19 U/L (ref 0–35)
AST: 17 U/L (ref 0–37)
Albumin: 4.3 g/dL (ref 3.5–5.2)
BILIRUBIN DIRECT: 0.1 mg/dL (ref 0.0–0.3)
BILIRUBIN TOTAL: 0.4 mg/dL (ref 0.2–1.2)
TOTAL PROTEIN: 7 g/dL (ref 6.0–8.3)

## 2016-06-11 LAB — URINALYSIS, ROUTINE W REFLEX MICROSCOPIC
Bilirubin Urine: NEGATIVE
Ketones, ur: NEGATIVE
LEUKOCYTES UA: NEGATIVE
Nitrite: NEGATIVE
PH: 6 (ref 5.0–8.0)
SPECIFIC GRAVITY, URINE: 1.025 (ref 1.000–1.030)
TOTAL PROTEIN, URINE-UPE24: NEGATIVE
Urine Glucose: NEGATIVE
Urobilinogen, UA: 0.2 (ref 0.0–1.0)

## 2016-06-11 LAB — CBC WITH DIFFERENTIAL/PLATELET
BASOS ABS: 0 10*3/uL (ref 0.0–0.1)
BASOS PCT: 0.5 % (ref 0.0–3.0)
Eosinophils Absolute: 0.2 10*3/uL (ref 0.0–0.7)
Eosinophils Relative: 3.4 % (ref 0.0–5.0)
HEMATOCRIT: 41.7 % (ref 36.0–46.0)
Hemoglobin: 14.1 g/dL (ref 12.0–15.0)
LYMPHS ABS: 1.7 10*3/uL (ref 0.7–4.0)
LYMPHS PCT: 33 % (ref 12.0–46.0)
MCHC: 33.7 g/dL (ref 30.0–36.0)
MCV: 94.9 fl (ref 78.0–100.0)
MONOS PCT: 15 % — AB (ref 3.0–12.0)
Monocytes Absolute: 0.8 10*3/uL (ref 0.1–1.0)
NEUTROS ABS: 2.4 10*3/uL (ref 1.4–7.7)
NEUTROS PCT: 48.1 % (ref 43.0–77.0)
PLATELETS: 244 10*3/uL (ref 150.0–400.0)
RBC: 4.39 Mil/uL (ref 3.87–5.11)
RDW: 14.8 % (ref 11.5–15.5)
WBC: 5.1 10*3/uL (ref 4.0–10.5)

## 2016-06-11 LAB — TSH: TSH: 7.88 u[IU]/mL — ABNORMAL HIGH (ref 0.35–4.50)

## 2016-06-11 LAB — VITAMIN D 25 HYDROXY (VIT D DEFICIENCY, FRACTURES): VITD: 21.15 ng/mL — AB (ref 30.00–100.00)

## 2016-06-11 LAB — HEPATITIS C ANTIBODY: HCV Ab: NEGATIVE

## 2016-06-11 LAB — LDL CHOLESTEROL, DIRECT: Direct LDL: 35 mg/dL

## 2016-06-12 ENCOUNTER — Encounter: Payer: Self-pay | Admitting: Endocrinology

## 2016-06-14 ENCOUNTER — Other Ambulatory Visit: Payer: Self-pay | Admitting: Endocrinology

## 2016-06-14 LAB — PTH, INTACT AND CALCIUM
Calcium: 9.8 mg/dL (ref 8.6–10.4)
PTH: 55 pg/mL (ref 14–64)

## 2016-06-14 MED ORDER — LEVOTHYROXINE SODIUM 25 MCG PO TABS: 25.0000 ug | ORAL_TABLET | Freq: Every day | ORAL | 3 refills | Status: DC

## 2016-11-14 IMAGING — CT CT ANGIO CHEST
2 of 6 series · 18 of 36 positions shown · IV contrast (OMNIPAQUE)
Comparison: Previous exams including chest x-ray dated 03/20/2015.

CLINICAL DATA: Syncope since fall 3 days ago with head injury.
Patient with blood clotting factors. History of skin cancer.

EXAM:
CT ANGIOGRAPHY CHEST WITH CONTRAST
TECHNIQUE: Multidetector CT imaging of the chest was performed using the
standard protocol during bolus administration of intravenous
contrast. Multiplanar CT image reconstructions and MIPs were
obtained to evaluate the vascular anatomy.
CONTRAST:  100mL OMNIPAQUE IOHEXOL 350 MG/ML SOLN

[Series 6: pe thins @ 1mm · axial · 0.71mm/px · z∈[-369,-99]mm · 17 of 300 slices shown]
[im 15/300  lung]
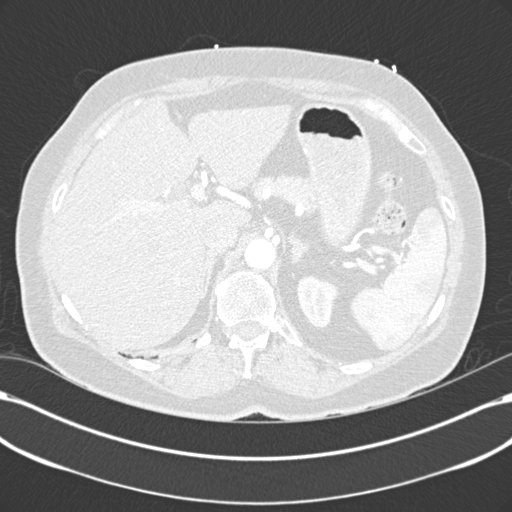
[im 30/300  mediastinal]
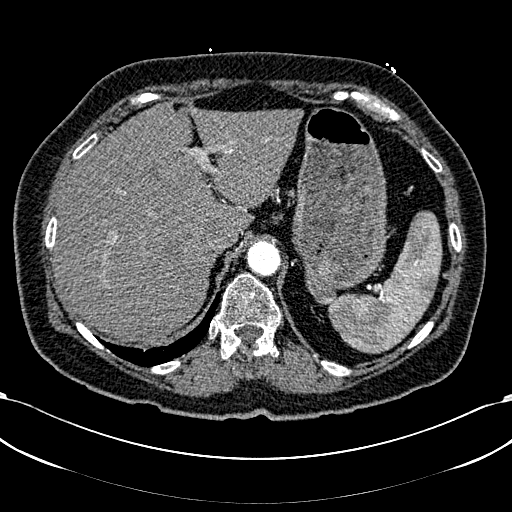
[im 45/300  lung]
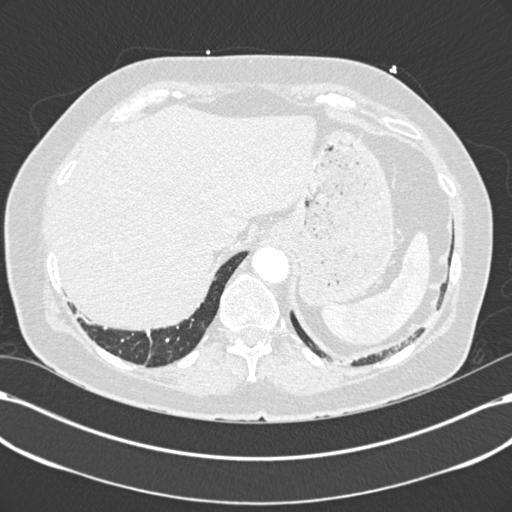
[im 60/300  mediastinal]
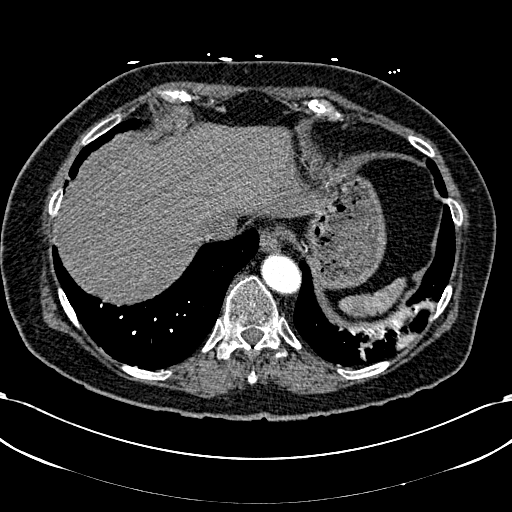
[im 90/300  lung]
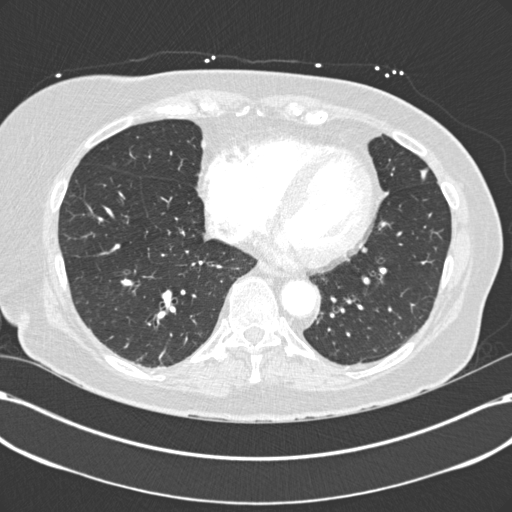
[im 105/300  mediastinal]
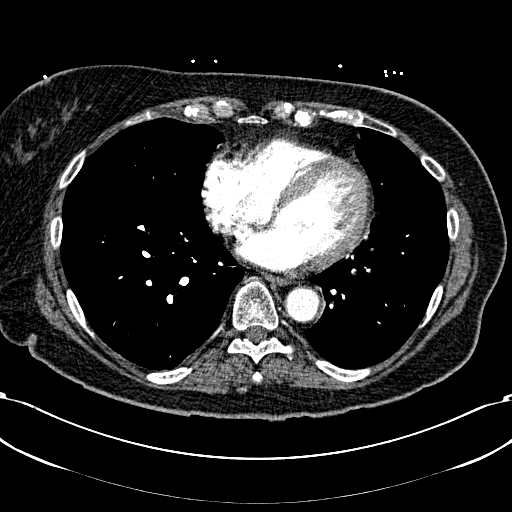
[im 120/300  lung]
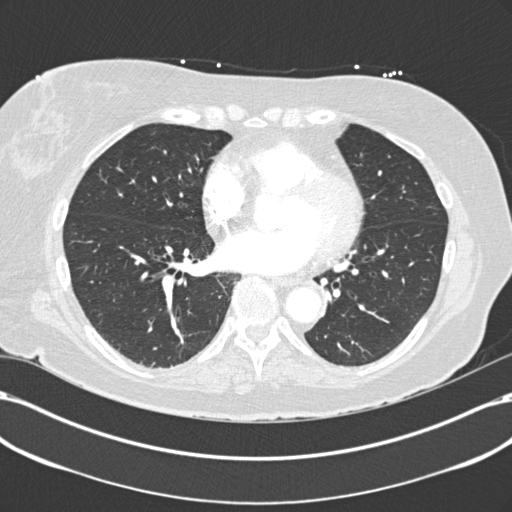
[im 135/300  mediastinal]
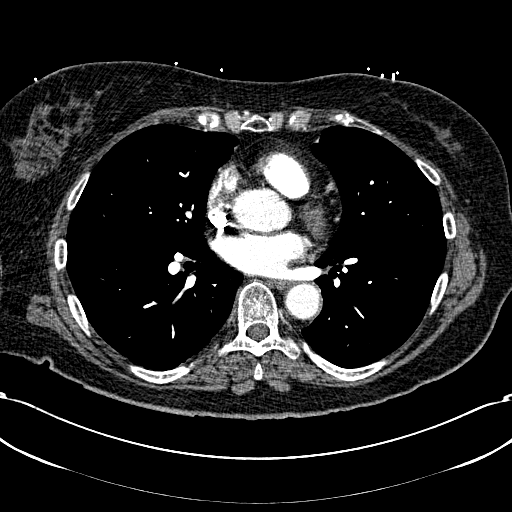
[im 150/300  lung]
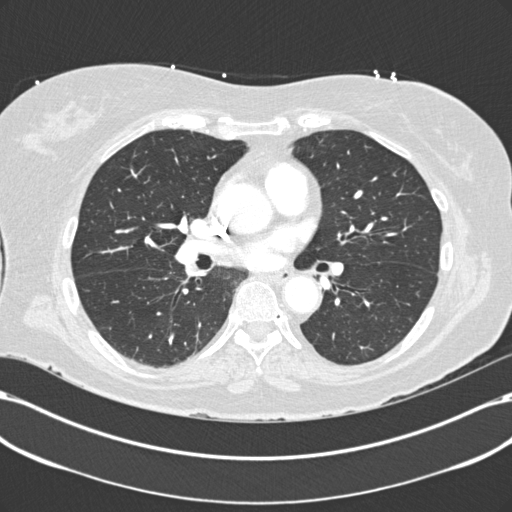
[im 165/300  mediastinal]
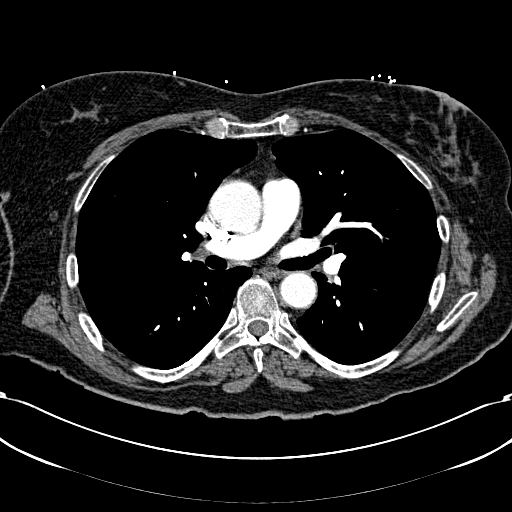
[im 180/300  lung]
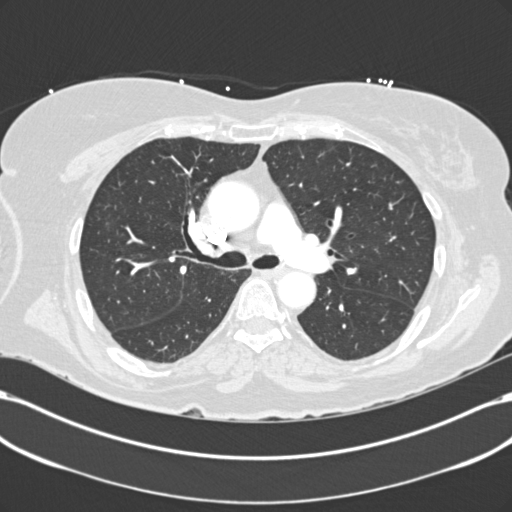
[im 195/300  mediastinal]
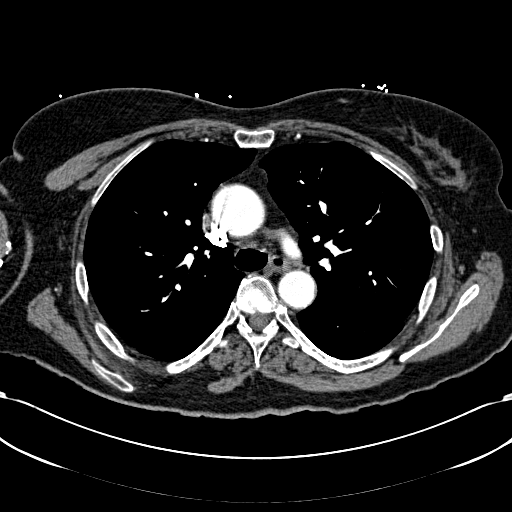
[im 210/300  lung]
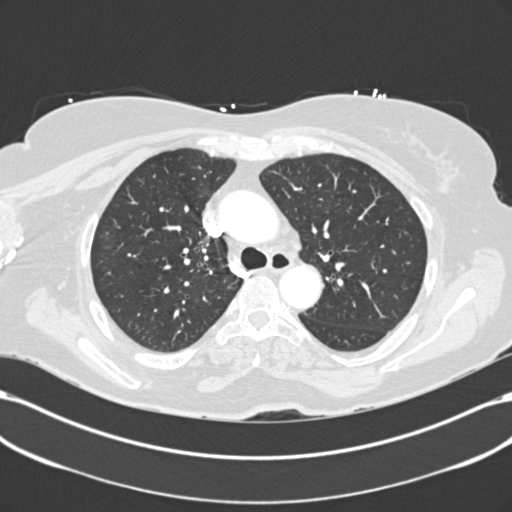
[im 240/300  mediastinal]
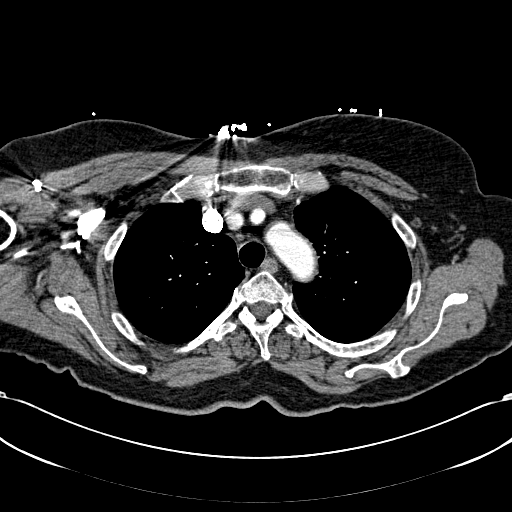
[im 255/300  lung]
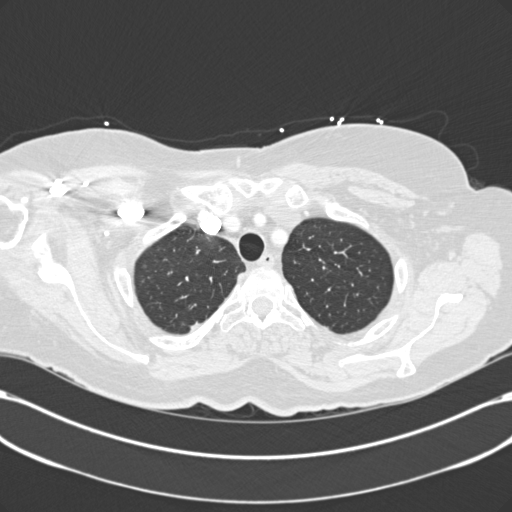
[im 270/300  mediastinal]
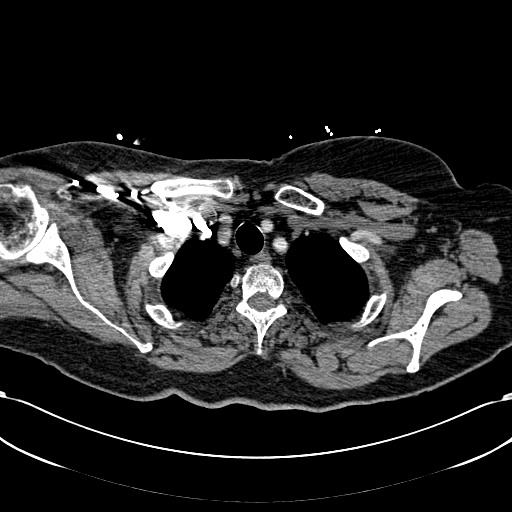
[im 285/300  lung]
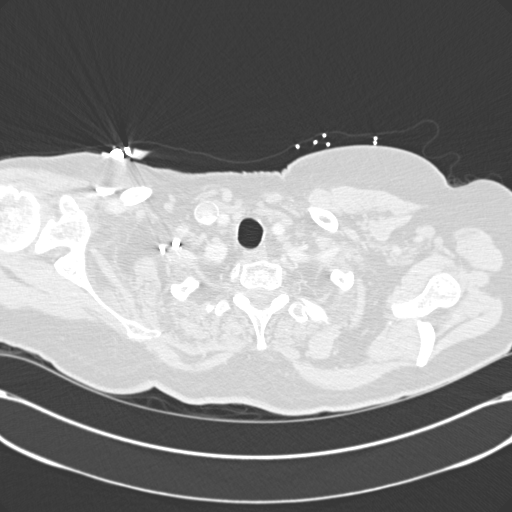

[Series 602: <mpr thick range> · coronal · 0.71mm/px · 1 of 138 slices shown]
[im 69/138  mediastinal]
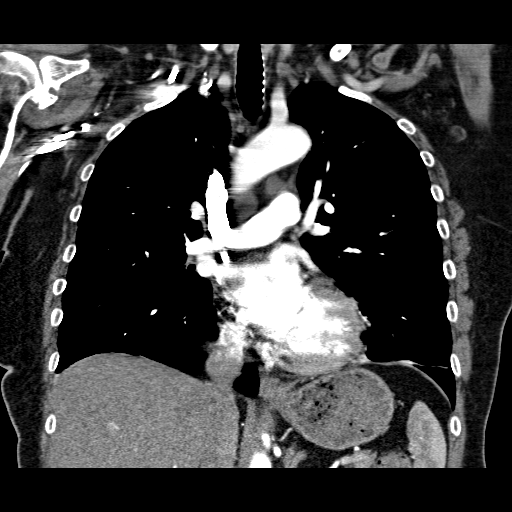

[18 of 36 positions shown; findings below may reference images not displayed]

FINDINGS: There is no pulmonary embolism seen within the main, lobar, or
segmental pulmonary arteries bilaterally. Scattered mild
atherosclerotic changes noted along the walls of the normal-caliber
thoracic aorta. No aortic aneurysm or dissection. Heart size is
normal. No pericardial effusion. Scattered coronary artery
calcifications noted.

Several small lymph nodes within the mediastinum. No mass or
enlarged lymph nodes seen within the mediastinum or perihilar
regions.

There is minimal scarring/atelectasis at each lung base. Also mild
scarring/fibrosis at each lung apex. Lungs otherwise clear. No
evidence of pneumonia. No pleural effusion. No pneumothorax. Trachea
and central bronchi are unremarkable.

Limited images of the upper abdomen are unremarkable. Scattered
degenerative changes within the thoracic spine but no acute osseous
abnormality.

Review of the MIP images confirms the above findings.
IMPRESSION: 1. No pulmonary embolism.
2. No thoracic aortic aneurysm or dissection.
3. Heart size is normal.  No pericardial effusion.
4. Scattered coronary artery calcifications, particularly dense
within a focal segment of the left anterior descending coronary
artery. Recommend correlation with any possible associated cardiac
symptoms.
5. Mild bibasilar scarring and/or atelectasis. Lungs otherwise
clear. No evidence of pneumonia. No pleural effusion.

## 2016-11-26 DIAGNOSIS — M170 Bilateral primary osteoarthritis of knee: Secondary | ICD-10-CM | POA: Diagnosis not present

## 2016-11-26 DIAGNOSIS — M1711 Unilateral primary osteoarthritis, right knee: Secondary | ICD-10-CM | POA: Diagnosis not present

## 2016-11-26 DIAGNOSIS — M1712 Unilateral primary osteoarthritis, left knee: Secondary | ICD-10-CM | POA: Diagnosis not present

## 2016-12-04 ENCOUNTER — Other Ambulatory Visit: Payer: Self-pay | Admitting: Endocrinology

## 2016-12-04 DIAGNOSIS — Z23 Encounter for immunization: Secondary | ICD-10-CM

## 2016-12-04 DIAGNOSIS — E7800 Pure hypercholesterolemia, unspecified: Secondary | ICD-10-CM

## 2016-12-04 DIAGNOSIS — E213 Hyperparathyroidism, unspecified: Secondary | ICD-10-CM

## 2016-12-04 DIAGNOSIS — M810 Age-related osteoporosis without current pathological fracture: Secondary | ICD-10-CM

## 2016-12-04 DIAGNOSIS — I10 Essential (primary) hypertension: Secondary | ICD-10-CM

## 2016-12-04 DIAGNOSIS — Z0000 Encounter for general adult medical examination without abnormal findings: Secondary | ICD-10-CM

## 2016-12-04 DIAGNOSIS — K219 Gastro-esophageal reflux disease without esophagitis: Secondary | ICD-10-CM

## 2016-12-04 DIAGNOSIS — E039 Hypothyroidism, unspecified: Secondary | ICD-10-CM

## 2016-12-04 DIAGNOSIS — D696 Thrombocytopenia, unspecified: Secondary | ICD-10-CM

## 2016-12-07 ENCOUNTER — Encounter: Payer: Self-pay | Admitting: Gastroenterology

## 2016-12-26 ENCOUNTER — Other Ambulatory Visit: Payer: Self-pay | Admitting: Endocrinology

## 2016-12-26 DIAGNOSIS — I10 Essential (primary) hypertension: Secondary | ICD-10-CM

## 2016-12-26 DIAGNOSIS — E039 Hypothyroidism, unspecified: Secondary | ICD-10-CM

## 2016-12-26 DIAGNOSIS — D696 Thrombocytopenia, unspecified: Secondary | ICD-10-CM

## 2016-12-26 DIAGNOSIS — Z0000 Encounter for general adult medical examination without abnormal findings: Secondary | ICD-10-CM

## 2016-12-26 DIAGNOSIS — M810 Age-related osteoporosis without current pathological fracture: Secondary | ICD-10-CM

## 2016-12-26 DIAGNOSIS — E213 Hyperparathyroidism, unspecified: Secondary | ICD-10-CM

## 2016-12-26 DIAGNOSIS — Z23 Encounter for immunization: Secondary | ICD-10-CM

## 2016-12-26 DIAGNOSIS — K219 Gastro-esophageal reflux disease without esophagitis: Secondary | ICD-10-CM

## 2016-12-26 DIAGNOSIS — E7800 Pure hypercholesterolemia, unspecified: Secondary | ICD-10-CM

## 2017-01-07 DIAGNOSIS — M170 Bilateral primary osteoarthritis of knee: Secondary | ICD-10-CM | POA: Diagnosis not present

## 2017-01-11 ENCOUNTER — Other Ambulatory Visit: Payer: Self-pay | Admitting: Endocrinology

## 2017-01-14 DIAGNOSIS — Z23 Encounter for immunization: Secondary | ICD-10-CM | POA: Diagnosis not present

## 2017-01-28 ENCOUNTER — Other Ambulatory Visit: Payer: Self-pay | Admitting: Endocrinology

## 2017-01-28 DIAGNOSIS — E7800 Pure hypercholesterolemia, unspecified: Secondary | ICD-10-CM

## 2017-01-28 DIAGNOSIS — E039 Hypothyroidism, unspecified: Secondary | ICD-10-CM

## 2017-01-28 DIAGNOSIS — D696 Thrombocytopenia, unspecified: Secondary | ICD-10-CM

## 2017-01-28 DIAGNOSIS — Z0000 Encounter for general adult medical examination without abnormal findings: Secondary | ICD-10-CM

## 2017-01-28 DIAGNOSIS — E213 Hyperparathyroidism, unspecified: Secondary | ICD-10-CM

## 2017-01-28 DIAGNOSIS — K219 Gastro-esophageal reflux disease without esophagitis: Secondary | ICD-10-CM

## 2017-01-28 DIAGNOSIS — M810 Age-related osteoporosis without current pathological fracture: Secondary | ICD-10-CM

## 2017-01-28 DIAGNOSIS — Z23 Encounter for immunization: Secondary | ICD-10-CM

## 2017-01-28 DIAGNOSIS — I10 Essential (primary) hypertension: Secondary | ICD-10-CM

## 2017-02-02 DIAGNOSIS — M1712 Unilateral primary osteoarthritis, left knee: Secondary | ICD-10-CM | POA: Diagnosis not present

## 2017-02-08 ENCOUNTER — Encounter: Payer: Self-pay | Admitting: Physician Assistant

## 2017-02-08 ENCOUNTER — Ambulatory Visit (INDEPENDENT_AMBULATORY_CARE_PROVIDER_SITE_OTHER): Payer: Medicare Other | Admitting: Physician Assistant

## 2017-02-08 VITALS — BP 118/78 | HR 85 | Temp 98.5°F | Resp 16 | Ht 68.0 in | Wt 185.0 lb

## 2017-02-08 DIAGNOSIS — K130 Diseases of lips: Secondary | ICD-10-CM

## 2017-02-08 MED ORDER — VALACYCLOVIR HCL 1 G PO TABS: ORAL_TABLET | ORAL | 0 refills | Status: DC

## 2017-02-08 NOTE — Patient Instructions (Addendum)
     IF you received an x-ray today, you will receive an invoice from [facility]. Please contact [facility] at [phone] with questions or concerns regarding your invoice.   IF you received labwork today, you will receive an invoice from [facility]. Please contact [facility] at [phone] with questions or concerns regarding your invoice.   Our billing staff will not be able to assist you with questions regarding bills from these companies.  You will be contacted with the lab results as soon as they are available. The fastest way to get your results is to activate your My Chart account. Instructions are located on the last page of this paperwork. If you have not heard from [place] regarding the results in 2 weeks, please contact this office.     Do the 2 pills then repeat in 12 h - if you are starting to feel better then stop there but if you are not getting some relief for this episode take 1 pill 2x/day for 7 days for this outbreak but in the future if you use this medication really quickly you should only have to do the 2 doses.

## 2017-02-08 NOTE — Progress Notes (Signed)
Jean Bowen  MRN: [id number] DOB: [birth year]  PCP: Renato Shin, MD  Chief Complaint  Patient presents with  . Rash    painful bumps around lips x3 days - second occurance within 1 month    Subjective:  Pt presents to clinic for rash on her lips for 2-3 days - she had something similar a few weeks ago that spontaneously resolve without medication.  She has known genital HSV and takes acyclovir daily.  She is under chronic stress with ill husband.  She has never had anything like this before.  She went to bed and woke up in the am with blisters in the left corner of her mouth that have spread to her lower lips (worse on the left) and slightly on the upper lips.  She using carmax and chapstick and vaseline to keep them moist.  History is obtained by patient.  Review of Systems  Constitutional: Negative for chills and fever.  HENT: Negative.   Skin: Positive for rash.    Patient Active Problem List   Diagnosis Date Noted  . Screening examination for infectious disease 03/17/2016  . Anemia 03/27/2015  . Syncope and collapse 03/19/2015  . Leukocytosis 03/19/2015  . 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 Hyperparathyroidism ([place]) 02/12/2015  . Thrombocytopenia ([place]) 02/11/2015  . Hearing loss 06/28/2012  . Encounter for long-term (current) use of other medications 03/06/2012  . Dysphagia 03/02/2011  . HYPERCHOLESTEROLEMIA 02/23/2010  . DERMATITIS, PERIORAL 01/08/2010  . ANGIOEDEMA 12/11/2009  . GERD 03/03/2007  . DEPRESSION 12/03/2006  . Essential hypertension 12/03/2006  . Osteoporosis 12/03/2006  . URINARY INCONTINENCE 12/03/2006  . COLONIC POLYPS, HX OF 12/03/2006    Current Outpatient Prescriptions on File Prior to Visit  Medication Sig Dispense Refill  . acyclovir (ZOVIRAX) 400 MG tablet TAKE TWO TABLETS BY MOUTH EVERY 4 HOURS WHILE AWAKE 90 tablet 1  . amLODipine (NORVASC) 2.5 MG tablet TAKE ONE TABLET BY MOUTH DAILY 90 tablet 1  . aspirin 81 MG  tablet Take 81 mg by mouth daily.      Marland Kitchen atorvastatin (LIPITOR) 10 MG tablet TAKE ONE TABLET BY MOUTH DAILY 90 tablet 1  . cholecalciferol (VITAMIN D-400) 400 UNITS TABS Take 400 Units by mouth daily.      Marland Kitchen escitalopram (LEXAPRO) 10 MG tablet TAKE ONE TABLET BY MOUTH DAILY 90 tablet 1  . hydrOXYzine (ATARAX/VISTARIL) 10 MG tablet Take 1 tablet (10 mg total) by mouth 3 (three) times daily as needed. 30 tablet 0  . ibuprofen (ADVIL,MOTRIN) 100 MG/5ML suspension Take 200 mg by mouth as needed.    Marland Kitchen levothyroxine (SYNTHROID, LEVOTHROID) 25 MCG tablet Take 1 tablet (25 mcg total) by mouth daily before breakfast. 90 tablet 3  . omeprazole (PRILOSEC) 40 MG capsule Take 1 capsule (40 mg total) by mouth daily. 30 capsule 11  . oxybutynin (DITROPAN) 5 MG tablet TAKE 1 TABLET (5 MG TOTAL) BY MOUTH 3 (THREE) TIMES DAILY. 270 tablet PRN   No current facility-administered medications on file prior to visit.     Allergies  Allergen Reactions  . Doxycycline     REACTION: swollen lips and throat  . Losartan Potassium     REACTION: ? of angioedema  . Morphine And Related     Caused vomiting when given too fast in IV.    Past Medical History:  Diagnosis Date  . ANGIOEDEMA 08/22/09  . DEPRESSION 12/03/2006  . DERMATITIS, PERIORAL 01/08/2010  . Diverticulosis   . GERD 03/03/2007  . Hiatal  hernia   . History of benign gastric tumor    significant for a benign gastric tumor that weighed approximately 5 pounds and was resected.  Marland Kitchen HYPERCHOLESTEROLEMIA 02/23/2010  . Hyperplastic colon polyp 12/03/2006  . HYPERTENSION 12/03/2006  . OSA (obstructive sleep apnea)   . OSTEOPOROSIS 12/03/2006  . Sleep apnea   . URINARY INCONTINENCE 12/03/2006   Social History   Social History Narrative   1 dog   Regular exercise-yes         Social History  Substance Use Topics  . Smoking status: Former Smoker    Types: Cigarettes    Quit date: 05/22/2001  . Smokeless tobacco: Never Used  . Alcohol use 1.8 oz/week     3 Glasses of wine per week     Comment: 1-2/week   family history includes Arthritis in her father; Colon cancer in her father; Diabetes in her unknown relative; Heart attack in her unknown relative; Lung cancer in her brother and mother; Melanoma in her mother.     Objective:  BP 118/78   Pulse 85   Temp 98.5 F (36.9 C) (Oral)   Resp 16   Ht 5\' 8"  (1.727 m)   Wt 185 lb (83.9 kg)   SpO2 97%   BMI 28.13 kg/m  Body mass index is 28.13 kg/m.  Physical Exam  Constitutional: She is oriented to person, place, and time and well-developed, well-nourished, and in no distress.  HENT:  Head: Normocephalic and atraumatic.  Right Ear: Hearing and external ear normal.  Left Ear: Hearing and external ear normal.  Left lower lips is swollen with confluent blisters - open sores in the left lip corner and some on the upper lips.  Eyes: Conjunctivae are normal.  Neck: Normal range of motion.  Pulmonary/Chest: Effort normal.  Neurological: She is alert and oriented to person, place, and time. Gait normal.  Skin: Skin is warm and dry.  Psychiatric: Mood, memory, affect and judgment normal.  Vitals reviewed.   Assessment and Plan :  Lip lesion - Plan: valACYclovir (VALTREX) 1000 MG tablet concern for HSV oral - even with the patient on daily acyclovir suppression I suspect this is what she is experiencing - we will start valtrex and for now hold the acyclovir.  She will have the valtrex for back-up and she will speak with her PCP about possible changing to valtrex for suppression as she started on this due to cost many years ago.  Windell Hummingbird PA-C  [facility] 02/08/2017 3:54 PM

## 2017-02-09 DIAGNOSIS — M1712 Unilateral primary osteoarthritis, left knee: Secondary | ICD-10-CM | POA: Diagnosis not present

## 2017-02-16 DIAGNOSIS — M1712 Unilateral primary osteoarthritis, left knee: Secondary | ICD-10-CM | POA: Diagnosis not present

## 2017-02-17 NOTE — Addendum Note (Signed)
Addended by: Mancel Bale on: 02/17/2017 10:01 AM   Modules accepted: Level of Service

## 2017-03-03 DIAGNOSIS — D225 Melanocytic nevi of trunk: Secondary | ICD-10-CM | POA: Diagnosis not present

## 2017-03-03 DIAGNOSIS — D1801 Hemangioma of skin and subcutaneous tissue: Secondary | ICD-10-CM | POA: Diagnosis not present

## 2017-03-03 DIAGNOSIS — Z85828 Personal history of other malignant neoplasm of skin: Secondary | ICD-10-CM | POA: Diagnosis not present

## 2017-03-03 DIAGNOSIS — L720 Epidermal cyst: Secondary | ICD-10-CM | POA: Diagnosis not present

## 2017-03-03 DIAGNOSIS — L821 Other seborrheic keratosis: Secondary | ICD-10-CM | POA: Diagnosis not present

## 2017-03-03 DIAGNOSIS — L570 Actinic keratosis: Secondary | ICD-10-CM | POA: Diagnosis not present

## 2017-03-31 DIAGNOSIS — M1711 Unilateral primary osteoarthritis, right knee: Secondary | ICD-10-CM | POA: Diagnosis not present

## 2017-03-31 DIAGNOSIS — M1712 Unilateral primary osteoarthritis, left knee: Secondary | ICD-10-CM | POA: Diagnosis not present

## 2017-06-20 ENCOUNTER — Other Ambulatory Visit: Payer: Self-pay | Admitting: Endocrinology

## 2017-06-20 DIAGNOSIS — E7800 Pure hypercholesterolemia, unspecified: Secondary | ICD-10-CM

## 2017-06-20 DIAGNOSIS — D696 Thrombocytopenia, unspecified: Secondary | ICD-10-CM

## 2017-06-20 DIAGNOSIS — E213 Hyperparathyroidism, unspecified: Secondary | ICD-10-CM

## 2017-06-20 DIAGNOSIS — Z0000 Encounter for general adult medical examination without abnormal findings: Secondary | ICD-10-CM

## 2017-06-20 DIAGNOSIS — E039 Hypothyroidism, unspecified: Secondary | ICD-10-CM

## 2017-06-20 DIAGNOSIS — K219 Gastro-esophageal reflux disease without esophagitis: Secondary | ICD-10-CM

## 2017-06-20 DIAGNOSIS — M810 Age-related osteoporosis without current pathological fracture: Secondary | ICD-10-CM

## 2017-06-20 DIAGNOSIS — I10 Essential (primary) hypertension: Secondary | ICD-10-CM

## 2017-06-20 DIAGNOSIS — Z23 Encounter for immunization: Secondary | ICD-10-CM

## 2017-06-29 ENCOUNTER — Other Ambulatory Visit: Payer: Self-pay | Admitting: Endocrinology

## 2017-06-29 DIAGNOSIS — I10 Essential (primary) hypertension: Secondary | ICD-10-CM

## 2017-06-29 DIAGNOSIS — E213 Hyperparathyroidism, unspecified: Secondary | ICD-10-CM

## 2017-06-29 DIAGNOSIS — M810 Age-related osteoporosis without current pathological fracture: Secondary | ICD-10-CM

## 2017-06-29 DIAGNOSIS — K219 Gastro-esophageal reflux disease without esophagitis: Secondary | ICD-10-CM

## 2017-06-29 DIAGNOSIS — Z23 Encounter for immunization: Secondary | ICD-10-CM

## 2017-06-29 DIAGNOSIS — D696 Thrombocytopenia, unspecified: Secondary | ICD-10-CM

## 2017-06-29 DIAGNOSIS — E039 Hypothyroidism, unspecified: Secondary | ICD-10-CM

## 2017-06-29 DIAGNOSIS — E7800 Pure hypercholesterolemia, unspecified: Secondary | ICD-10-CM

## 2017-06-29 DIAGNOSIS — Z0000 Encounter for general adult medical examination without abnormal findings: Secondary | ICD-10-CM

## 2017-06-30 NOTE — Telephone Encounter (Signed)
Please refill x 1 Ov is due  

## 2017-06-30 NOTE — Telephone Encounter (Signed)
Last ov 03/17/16 2 canceled and no future scheduled refill or refuse please advise 

## 2017-07-20 ENCOUNTER — Other Ambulatory Visit: Payer: Self-pay | Admitting: Endocrinology

## 2017-07-20 DIAGNOSIS — D696 Thrombocytopenia, unspecified: Secondary | ICD-10-CM

## 2017-07-20 DIAGNOSIS — K219 Gastro-esophageal reflux disease without esophagitis: Secondary | ICD-10-CM

## 2017-07-20 DIAGNOSIS — Z0000 Encounter for general adult medical examination without abnormal findings: Secondary | ICD-10-CM

## 2017-07-20 DIAGNOSIS — Z23 Encounter for immunization: Secondary | ICD-10-CM

## 2017-07-20 DIAGNOSIS — E7800 Pure hypercholesterolemia, unspecified: Secondary | ICD-10-CM

## 2017-07-20 DIAGNOSIS — E039 Hypothyroidism, unspecified: Secondary | ICD-10-CM

## 2017-07-20 DIAGNOSIS — E213 Hyperparathyroidism, unspecified: Secondary | ICD-10-CM

## 2017-07-20 DIAGNOSIS — M810 Age-related osteoporosis without current pathological fracture: Secondary | ICD-10-CM

## 2017-07-20 DIAGNOSIS — I10 Essential (primary) hypertension: Secondary | ICD-10-CM

## 2017-07-21 NOTE — Telephone Encounter (Signed)
Please refill x 1 Ov is due  

## 2017-07-21 NOTE — Telephone Encounter (Signed)
Last ov 03/17/16 2 cancelled and no future scheduled refill or refuse please advise

## 2017-08-11 ENCOUNTER — Other Ambulatory Visit: Payer: Self-pay | Admitting: Endocrinology

## 2017-08-11 DIAGNOSIS — E7800 Pure hypercholesterolemia, unspecified: Secondary | ICD-10-CM

## 2017-08-11 DIAGNOSIS — E039 Hypothyroidism, unspecified: Secondary | ICD-10-CM

## 2017-08-11 DIAGNOSIS — M810 Age-related osteoporosis without current pathological fracture: Secondary | ICD-10-CM

## 2017-08-11 DIAGNOSIS — I10 Essential (primary) hypertension: Secondary | ICD-10-CM

## 2017-08-11 DIAGNOSIS — E213 Hyperparathyroidism, unspecified: Secondary | ICD-10-CM

## 2017-08-11 DIAGNOSIS — D696 Thrombocytopenia, unspecified: Secondary | ICD-10-CM

## 2017-08-11 DIAGNOSIS — K219 Gastro-esophageal reflux disease without esophagitis: Secondary | ICD-10-CM

## 2017-08-11 DIAGNOSIS — Z23 Encounter for immunization: Secondary | ICD-10-CM

## 2017-08-11 DIAGNOSIS — Z0000 Encounter for general adult medical examination without abnormal findings: Secondary | ICD-10-CM

## 2017-08-31 ENCOUNTER — Other Ambulatory Visit: Payer: Self-pay | Admitting: Endocrinology

## 2017-08-31 DIAGNOSIS — E039 Hypothyroidism, unspecified: Secondary | ICD-10-CM

## 2017-08-31 DIAGNOSIS — I10 Essential (primary) hypertension: Secondary | ICD-10-CM

## 2017-08-31 DIAGNOSIS — E213 Hyperparathyroidism, unspecified: Secondary | ICD-10-CM

## 2017-08-31 DIAGNOSIS — Z0000 Encounter for general adult medical examination without abnormal findings: Secondary | ICD-10-CM

## 2017-08-31 DIAGNOSIS — K219 Gastro-esophageal reflux disease without esophagitis: Secondary | ICD-10-CM

## 2017-08-31 DIAGNOSIS — E7800 Pure hypercholesterolemia, unspecified: Secondary | ICD-10-CM

## 2017-08-31 DIAGNOSIS — M810 Age-related osteoporosis without current pathological fracture: Secondary | ICD-10-CM

## 2017-08-31 DIAGNOSIS — Z23 Encounter for immunization: Secondary | ICD-10-CM

## 2017-08-31 DIAGNOSIS — D696 Thrombocytopenia, unspecified: Secondary | ICD-10-CM

## 2017-09-01 NOTE — Telephone Encounter (Signed)
Ok to refill patient hasn't been seen since 2017?

## 2017-09-01 NOTE — Telephone Encounter (Signed)
Please refill x 1 Ov is due  

## 2017-09-10 ENCOUNTER — Other Ambulatory Visit: Payer: Self-pay | Admitting: Endocrinology

## 2017-09-10 DIAGNOSIS — I10 Essential (primary) hypertension: Secondary | ICD-10-CM

## 2017-09-10 DIAGNOSIS — E7800 Pure hypercholesterolemia, unspecified: Secondary | ICD-10-CM

## 2017-09-10 DIAGNOSIS — D696 Thrombocytopenia, unspecified: Secondary | ICD-10-CM

## 2017-09-10 DIAGNOSIS — Z0000 Encounter for general adult medical examination without abnormal findings: Secondary | ICD-10-CM

## 2017-09-10 DIAGNOSIS — Z23 Encounter for immunization: Secondary | ICD-10-CM

## 2017-09-10 DIAGNOSIS — M810 Age-related osteoporosis without current pathological fracture: Secondary | ICD-10-CM

## 2017-09-10 DIAGNOSIS — E213 Hyperparathyroidism, unspecified: Secondary | ICD-10-CM

## 2017-09-10 DIAGNOSIS — K219 Gastro-esophageal reflux disease without esophagitis: Secondary | ICD-10-CM

## 2017-09-10 DIAGNOSIS — E039 Hypothyroidism, unspecified: Secondary | ICD-10-CM

## 2017-09-28 ENCOUNTER — Other Ambulatory Visit: Payer: Self-pay | Admitting: Endocrinology

## 2017-09-28 DIAGNOSIS — E213 Hyperparathyroidism, unspecified: Secondary | ICD-10-CM

## 2017-09-28 DIAGNOSIS — M810 Age-related osteoporosis without current pathological fracture: Secondary | ICD-10-CM

## 2017-09-28 DIAGNOSIS — I10 Essential (primary) hypertension: Secondary | ICD-10-CM

## 2017-09-28 DIAGNOSIS — Z23 Encounter for immunization: Secondary | ICD-10-CM

## 2017-09-28 DIAGNOSIS — E7800 Pure hypercholesterolemia, unspecified: Secondary | ICD-10-CM

## 2017-09-28 DIAGNOSIS — D696 Thrombocytopenia, unspecified: Secondary | ICD-10-CM

## 2017-09-28 DIAGNOSIS — K219 Gastro-esophageal reflux disease without esophagitis: Secondary | ICD-10-CM

## 2017-09-28 DIAGNOSIS — E039 Hypothyroidism, unspecified: Secondary | ICD-10-CM

## 2017-09-28 DIAGNOSIS — Z0000 Encounter for general adult medical examination without abnormal findings: Secondary | ICD-10-CM

## 2017-10-05 ENCOUNTER — Other Ambulatory Visit: Payer: Self-pay | Admitting: Endocrinology

## 2017-10-05 DIAGNOSIS — E7800 Pure hypercholesterolemia, unspecified: Secondary | ICD-10-CM

## 2017-10-05 DIAGNOSIS — K219 Gastro-esophageal reflux disease without esophagitis: Secondary | ICD-10-CM

## 2017-10-05 DIAGNOSIS — E213 Hyperparathyroidism, unspecified: Secondary | ICD-10-CM

## 2017-10-05 DIAGNOSIS — Z0000 Encounter for general adult medical examination without abnormal findings: Secondary | ICD-10-CM

## 2017-10-05 DIAGNOSIS — I10 Essential (primary) hypertension: Secondary | ICD-10-CM

## 2017-10-05 DIAGNOSIS — M810 Age-related osteoporosis without current pathological fracture: Secondary | ICD-10-CM

## 2017-10-05 DIAGNOSIS — D696 Thrombocytopenia, unspecified: Secondary | ICD-10-CM

## 2017-10-05 DIAGNOSIS — E039 Hypothyroidism, unspecified: Secondary | ICD-10-CM

## 2017-10-05 DIAGNOSIS — Z23 Encounter for immunization: Secondary | ICD-10-CM

## 2017-10-06 NOTE — Telephone Encounter (Signed)
Please refill x 3 mos Ov is due 

## 2017-10-06 NOTE — Telephone Encounter (Signed)
Last ov 03/17/16 2 canceled and no future scheduled refill or refuse please advise

## 2017-11-06 ENCOUNTER — Other Ambulatory Visit: Payer: Self-pay | Admitting: Endocrinology

## 2017-11-06 DIAGNOSIS — E039 Hypothyroidism, unspecified: Secondary | ICD-10-CM

## 2017-11-06 DIAGNOSIS — K219 Gastro-esophageal reflux disease without esophagitis: Secondary | ICD-10-CM

## 2017-11-06 DIAGNOSIS — E213 Hyperparathyroidism, unspecified: Secondary | ICD-10-CM

## 2017-11-06 DIAGNOSIS — D696 Thrombocytopenia, unspecified: Secondary | ICD-10-CM

## 2017-11-06 DIAGNOSIS — I10 Essential (primary) hypertension: Secondary | ICD-10-CM

## 2017-11-06 DIAGNOSIS — Z23 Encounter for immunization: Secondary | ICD-10-CM

## 2017-11-06 DIAGNOSIS — Z0000 Encounter for general adult medical examination without abnormal findings: Secondary | ICD-10-CM

## 2017-11-06 DIAGNOSIS — M810 Age-related osteoporosis without current pathological fracture: Secondary | ICD-10-CM

## 2017-11-06 DIAGNOSIS — E7800 Pure hypercholesterolemia, unspecified: Secondary | ICD-10-CM

## 2017-12-11 ENCOUNTER — Other Ambulatory Visit: Payer: Self-pay | Admitting: Endocrinology

## 2017-12-12 NOTE — Telephone Encounter (Signed)
Last ov 03/17/16 and has 2 canceled appointments refill or refuse please advise

## 2017-12-12 NOTE — Telephone Encounter (Signed)
Please refill x 1 Ov is due  

## 2017-12-17 ENCOUNTER — Other Ambulatory Visit: Payer: Self-pay | Admitting: Endocrinology

## 2017-12-17 DIAGNOSIS — I10 Essential (primary) hypertension: Secondary | ICD-10-CM

## 2017-12-17 DIAGNOSIS — E039 Hypothyroidism, unspecified: Secondary | ICD-10-CM

## 2017-12-17 DIAGNOSIS — M810 Age-related osteoporosis without current pathological fracture: Secondary | ICD-10-CM

## 2017-12-17 DIAGNOSIS — Z23 Encounter for immunization: Secondary | ICD-10-CM

## 2017-12-17 DIAGNOSIS — D696 Thrombocytopenia, unspecified: Secondary | ICD-10-CM

## 2017-12-17 DIAGNOSIS — E7800 Pure hypercholesterolemia, unspecified: Secondary | ICD-10-CM

## 2017-12-17 DIAGNOSIS — Z0000 Encounter for general adult medical examination without abnormal findings: Secondary | ICD-10-CM

## 2017-12-17 DIAGNOSIS — E213 Hyperparathyroidism, unspecified: Secondary | ICD-10-CM

## 2017-12-17 DIAGNOSIS — K219 Gastro-esophageal reflux disease without esophagitis: Secondary | ICD-10-CM

## 2017-12-20 DIAGNOSIS — Z23 Encounter for immunization: Secondary | ICD-10-CM | POA: Diagnosis not present

## 2018-01-16 ENCOUNTER — Other Ambulatory Visit: Payer: Self-pay | Admitting: Endocrinology

## 2018-02-16 ENCOUNTER — Encounter: Payer: Self-pay | Admitting: Family Medicine

## 2018-02-16 ENCOUNTER — Ambulatory Visit (INDEPENDENT_AMBULATORY_CARE_PROVIDER_SITE_OTHER): Payer: Medicare Other | Admitting: Family Medicine

## 2018-02-16 VITALS — BP 124/72 | HR 74 | Temp 97.9°F | Ht 68.0 in | Wt 181.0 lb

## 2018-02-16 DIAGNOSIS — E038 Other specified hypothyroidism: Secondary | ICD-10-CM

## 2018-02-16 DIAGNOSIS — Z119 Encounter for screening for infectious and parasitic diseases, unspecified: Secondary | ICD-10-CM

## 2018-02-16 DIAGNOSIS — I10 Essential (primary) hypertension: Secondary | ICD-10-CM

## 2018-02-16 DIAGNOSIS — F325 Major depressive disorder, single episode, in full remission: Secondary | ICD-10-CM

## 2018-02-16 DIAGNOSIS — L299 Pruritus, unspecified: Secondary | ICD-10-CM | POA: Diagnosis not present

## 2018-02-16 DIAGNOSIS — N3281 Overactive bladder: Secondary | ICD-10-CM | POA: Diagnosis not present

## 2018-02-16 DIAGNOSIS — E039 Hypothyroidism, unspecified: Secondary | ICD-10-CM

## 2018-02-16 DIAGNOSIS — K219 Gastro-esophageal reflux disease without esophagitis: Secondary | ICD-10-CM

## 2018-02-16 DIAGNOSIS — E213 Hyperparathyroidism, unspecified: Secondary | ICD-10-CM

## 2018-02-16 DIAGNOSIS — L282 Other prurigo: Secondary | ICD-10-CM

## 2018-02-16 DIAGNOSIS — D696 Thrombocytopenia, unspecified: Secondary | ICD-10-CM

## 2018-02-16 DIAGNOSIS — E7800 Pure hypercholesterolemia, unspecified: Secondary | ICD-10-CM

## 2018-02-16 LAB — COMPREHENSIVE METABOLIC PANEL
ALT: 17 U/L (ref 0–35)
AST: 15 U/L (ref 0–37)
Albumin: 4.3 g/dL (ref 3.5–5.2)
Alkaline Phosphatase: 62 U/L (ref 39–117)
BILIRUBIN TOTAL: 0.6 mg/dL (ref 0.2–1.2)
BUN: 18 mg/dL (ref 6–23)
CALCIUM: 8.9 mg/dL (ref 8.4–10.5)
CO2: 29 meq/L (ref 19–32)
CREATININE: 0.78 mg/dL (ref 0.40–1.20)
Chloride: 104 mEq/L (ref 96–112)
GFR: 76.95 mL/min (ref >60.00)
Glucose, Bld: 97 mg/dL (ref 70–99)
Potassium: 3.9 mEq/L (ref 3.5–5.1)
Sodium: 139 mEq/L (ref 135–145)
Total Protein: 6.9 g/dL (ref 6.0–8.3)

## 2018-02-16 LAB — LIPID PANEL
Cholesterol: 129 mg/dL (ref 0–200)
HDL: 51.3 mg/dL (ref >39.00)
NONHDL: 78.16
TRIGLYCERIDES: 207 mg/dL — AB (ref 0.0–149.0)
Total CHOL/HDL Ratio: 3
VLDL: 41.4 mg/dL — ABNORMAL HIGH (ref 0.0–40.0)

## 2018-02-16 LAB — CBC
HCT: 42.9 % (ref 36.0–46.0)
HEMOGLOBIN: 14.5 g/dL (ref 12.0–15.0)
MCHC: 33.7 g/dL (ref 30.0–36.0)
MCV: 92.7 fl (ref 78.0–100.0)
PLATELETS: 229 10*3/uL (ref 150.0–400.0)
RBC: 4.63 Mil/uL (ref 3.87–5.11)
RDW: 13.9 % (ref 11.5–15.5)
WBC: 6.2 10*3/uL (ref 4.0–10.5)

## 2018-02-16 LAB — LDL CHOLESTEROL, DIRECT: Direct LDL: 33 mg/dL

## 2018-02-16 LAB — TSH: TSH: 2.77 u[IU]/mL (ref 0.35–4.50)

## 2018-02-16 MED ORDER — AMLODIPINE BESYLATE 2.5 MG PO TABS: 2.5000 mg | ORAL_TABLET | Freq: Every day | ORAL | 1 refills | Status: DC

## 2018-02-16 MED ORDER — OXYBUTYNIN CHLORIDE 5 MG PO TABS: 5.0000 mg | ORAL_TABLET | Freq: Three times a day (TID) | ORAL | 1 refills | Status: DC

## 2018-02-16 MED ORDER — ACYCLOVIR 400 MG PO TABS: ORAL_TABLET | ORAL | 1 refills | Status: DC

## 2018-02-16 MED ORDER — HYDROXYZINE HCL 10 MG PO TABS: 10.0000 mg | ORAL_TABLET | Freq: Three times a day (TID) | ORAL | 1 refills | Status: DC | PRN

## 2018-02-16 MED ORDER — ATORVASTATIN CALCIUM 10 MG PO TABS: 10.0000 mg | ORAL_TABLET | Freq: Every day | ORAL | 0 refills | Status: DC

## 2018-02-16 MED ORDER — LEVOTHYROXINE SODIUM 25 MCG PO TABS: ORAL_TABLET | ORAL | 2 refills | Status: DC

## 2018-02-16 MED ORDER — ESCITALOPRAM OXALATE 10 MG PO TABS: 10.0000 mg | ORAL_TABLET | Freq: Every day | ORAL | 1 refills | Status: DC

## 2018-02-16 NOTE — Assessment & Plan Note (Signed)
Stable.  Continue oxybutynin 5 mg 3 times daily as needed.

## 2018-02-16 NOTE — Assessment & Plan Note (Addendum)
At goal on Norvasc 2.5 mg daily.    Consider stopping medication in the future if remains at goal.

## 2018-02-16 NOTE — Assessment & Plan Note (Signed)
- 

## 2018-02-16 NOTE — Assessment & Plan Note (Signed)
Stable.  Continue Lexapro 10 mg daily. 

## 2018-02-16 NOTE — Assessment & Plan Note (Signed)
Check CBC 

## 2018-02-16 NOTE — Assessment & Plan Note (Signed)
Check CMET. 

## 2018-02-16 NOTE — Progress Notes (Signed)
   Subjective:  Jean Bowen is a 72 y.o. female who presents today with a chief complaint of HTN and to transfer care to this office.  HPI:  HTN Currently on Norvasc 2.5 mg daily and tolerating well.  HLD Current Lipitor 10 mg daily and tolerating well.  No reported myalgias.  Hypothyroidism Several year history.  She has been on Synthroid 25 mcg daily for the past couple of years.  Occasionally has some fatigue that she attributes to stress in her life.  Otherwise feels fine.  Depression Several year history.  Currently on Lexapro 10 mg daily.  Tolerating well.  Symptoms are well controlled.  Pruritus Takes hydroxyzine 10 mg 3 times daily as needed.  GERD On Prilosec 40 mg daily and tolerating well.  Symptoms are well controlled.  Overactive bladder On oxybutynin 5 mg 3 times daily as needed.  Symptoms are stable and well controlled.  ROS: Per HPI  PMH: She reports that she quit smoking about 16 years ago. Her smoking use included cigarettes. She has a 60.00 pack-year smoking history. She has never used smokeless tobacco. She reports that she drinks about 3.0 standard drinks of alcohol per week. She reports that she does not use drugs.  Objective:  Physical Exam: BP 124/72 (BP Location: Left Arm, Patient Position: Sitting, Cuff Size: Normal)   Pulse 74   Temp 97.9 F (36.6 C) (Oral)   Ht '5\' 8"'$  (1.727 m)   Wt 181 lb (82.1 kg)   SpO2 96%   BMI 27.52 kg/m   Gen: NAD, resting comfortably CV: RRR with no murmurs appreciated Pulm: NWOB, CTAB with no crackles, wheezes, or rhonchi GI: Normal bowel sounds present. Soft, Nontender, Nondistended. MSK: No edema, cyanosis, or clubbing noted Skin: Warm, dry Neuro: Grossly normal, moves all extremities Psych: Normal affect and thought content  Assessment/Plan:  Thrombocytopenia (H[facility]) Check CBC.   Hyperparathyroidism ([place]) Check CMET.   HYPERCHOLESTEROLEMIA Last LDL 35.  Continue Lipitor 10 mg daily.  Check lipid panel  today.  GERD Stable.  Continue Prilosec 40 mg daily.  Check CMET.  Essential hypertension At goal on Norvasc 2.5 mg daily.    Consider stopping medication in the future if remains at goal.  Major depressive disorder, single episode, in remission ([place]) Stable.  Continue Lexapro 10 mg daily.  Overactive bladder Stable.  Continue oxybutynin 5 mg 3 times daily as needed.  Pruritus Stable.  Hydroxyzine refilled.  Hypothyroidism Continue Synthroid 25 mcg daily.  Check TSH.  Preventive health care Check MMR titers.  Algis Greenhouse. Jerline Pain, MD 02/16/2018 3:45 PM

## 2018-02-16 NOTE — Patient Instructions (Signed)
It was very nice to see you today!  I will refill all of your medications today.  No medication changes today.  We will check blood work today and contact with results once they are available.  Please contact me in 1 year, or sooner as needed.  Take care, Dr Jerline Pain

## 2018-02-16 NOTE — Assessment & Plan Note (Signed)
Stable.  Continue Prilosec 40 mg daily.  Check CMET.

## 2018-02-16 NOTE — Assessment & Plan Note (Signed)
Stable. Hydroxyzine refilled.  

## 2018-02-16 NOTE — Assessment & Plan Note (Signed)
Last LDL 35.  Continue Lipitor 10 mg daily.  Check lipid panel today.

## 2018-02-17 LAB — MEASLES/MUMPS/RUBELLA IMMUNITY
Rubella: 19.8 index
Rubeola IgG: >300 AU/mL

## 2018-02-20 NOTE — Progress Notes (Signed)
Dr Marigene Ehlers interpretation of your lab work:  Your labs are NORMAL as we discussed today.    If you have any additional questions, please give [place] a call or send [place] a message through [place].  Take care, Dr Jerline Pain

## 2018-02-21 DIAGNOSIS — M25561 Pain in right knee: Secondary | ICD-10-CM | POA: Diagnosis not present

## 2018-02-22 DIAGNOSIS — M25561 Pain in right knee: Secondary | ICD-10-CM | POA: Diagnosis not present

## 2018-02-23 DIAGNOSIS — S82144D Nondisplaced bicondylar fracture of right tibia, subsequent encounter for closed fracture with routine healing: Secondary | ICD-10-CM | POA: Diagnosis not present

## 2018-03-02 DIAGNOSIS — Z85828 Personal history of other malignant neoplasm of skin: Secondary | ICD-10-CM | POA: Diagnosis not present

## 2018-03-02 DIAGNOSIS — L853 Xerosis cutis: Secondary | ICD-10-CM | POA: Diagnosis not present

## 2018-03-02 DIAGNOSIS — D1801 Hemangioma of skin and subcutaneous tissue: Secondary | ICD-10-CM | POA: Diagnosis not present

## 2018-03-02 DIAGNOSIS — L821 Other seborrheic keratosis: Secondary | ICD-10-CM | POA: Diagnosis not present

## 2018-03-02 DIAGNOSIS — L570 Actinic keratosis: Secondary | ICD-10-CM | POA: Diagnosis not present

## 2018-03-14 DIAGNOSIS — S82101D Unspecified fracture of upper end of right tibia, subsequent encounter for closed fracture with routine healing: Secondary | ICD-10-CM | POA: Diagnosis not present

## 2018-03-17 ENCOUNTER — Other Ambulatory Visit: Payer: Self-pay | Admitting: Family Medicine

## 2018-03-22 DIAGNOSIS — M25561 Pain in right knee: Secondary | ICD-10-CM | POA: Diagnosis not present

## 2018-03-22 DIAGNOSIS — S82101S Unspecified fracture of upper end of right tibia, sequela: Secondary | ICD-10-CM | POA: Diagnosis not present

## 2018-04-14 ENCOUNTER — Other Ambulatory Visit: Payer: Self-pay | Admitting: Family Medicine

## 2018-04-14 DIAGNOSIS — S82101S Unspecified fracture of upper end of right tibia, sequela: Secondary | ICD-10-CM | POA: Diagnosis not present

## 2018-04-14 DIAGNOSIS — M1711 Unilateral primary osteoarthritis, right knee: Secondary | ICD-10-CM | POA: Diagnosis not present

## 2018-05-01 ENCOUNTER — Other Ambulatory Visit: Payer: Self-pay | Admitting: Family Medicine

## 2018-05-31 ENCOUNTER — Other Ambulatory Visit: Payer: Self-pay | Admitting: Family Medicine

## 2018-06-17 ENCOUNTER — Other Ambulatory Visit: Payer: Self-pay | Admitting: Family Medicine

## 2018-07-31 ENCOUNTER — Other Ambulatory Visit: Payer: Self-pay | Admitting: Endocrinology

## 2018-08-19 ENCOUNTER — Other Ambulatory Visit: Payer: Self-pay | Admitting: Family Medicine

## 2018-09-05 ENCOUNTER — Other Ambulatory Visit: Payer: Self-pay | Admitting: Family Medicine

## 2018-09-05 DIAGNOSIS — E7800 Pure hypercholesterolemia, unspecified: Secondary | ICD-10-CM

## 2018-09-05 DIAGNOSIS — E039 Hypothyroidism, unspecified: Secondary | ICD-10-CM

## 2018-09-05 DIAGNOSIS — D696 Thrombocytopenia, unspecified: Secondary | ICD-10-CM

## 2018-09-05 DIAGNOSIS — K219 Gastro-esophageal reflux disease without esophagitis: Secondary | ICD-10-CM

## 2018-09-05 DIAGNOSIS — E213 Hyperparathyroidism, unspecified: Secondary | ICD-10-CM

## 2018-09-05 DIAGNOSIS — I10 Essential (primary) hypertension: Secondary | ICD-10-CM

## 2018-09-29 ENCOUNTER — Other Ambulatory Visit: Payer: Self-pay | Admitting: Family Medicine

## 2018-09-29 DIAGNOSIS — E039 Hypothyroidism, unspecified: Secondary | ICD-10-CM

## 2018-09-29 DIAGNOSIS — E213 Hyperparathyroidism, unspecified: Secondary | ICD-10-CM

## 2018-09-29 DIAGNOSIS — D696 Thrombocytopenia, unspecified: Secondary | ICD-10-CM

## 2018-09-29 DIAGNOSIS — I10 Essential (primary) hypertension: Secondary | ICD-10-CM

## 2018-09-29 DIAGNOSIS — K219 Gastro-esophageal reflux disease without esophagitis: Secondary | ICD-10-CM

## 2018-09-29 DIAGNOSIS — E7800 Pure hypercholesterolemia, unspecified: Secondary | ICD-10-CM

## 2018-10-02 NOTE — Telephone Encounter (Signed)
Not sure why pt is taking Acyclovir. Forwarding to Dr. Jerline Pain.

## 2018-10-02 NOTE — Telephone Encounter (Signed)
Last med check 02/16/18 f/u 1 year Last refill Atorvastatin 08/21/18 #30/0                Acyclovir 02/16/18 #90/1 Next OV not scheduled

## 2018-10-09 ENCOUNTER — Other Ambulatory Visit: Payer: Self-pay | Admitting: Family Medicine

## 2018-10-09 DIAGNOSIS — K219 Gastro-esophageal reflux disease without esophagitis: Secondary | ICD-10-CM

## 2018-10-09 DIAGNOSIS — E213 Hyperparathyroidism, unspecified: Secondary | ICD-10-CM

## 2018-10-09 DIAGNOSIS — D696 Thrombocytopenia, unspecified: Secondary | ICD-10-CM

## 2018-10-09 DIAGNOSIS — I10 Essential (primary) hypertension: Secondary | ICD-10-CM

## 2018-10-09 DIAGNOSIS — E7800 Pure hypercholesterolemia, unspecified: Secondary | ICD-10-CM

## 2018-10-09 DIAGNOSIS — E039 Hypothyroidism, unspecified: Secondary | ICD-10-CM

## 2018-11-06 ENCOUNTER — Telehealth: Payer: Self-pay | Admitting: Family Medicine

## 2018-11-06 NOTE — Telephone Encounter (Signed)
See note  Copied from [facility] #[id number]. Topic: Referral - Request for Referral >> Nov 06, 2018  4:35 PM Yvette Rack wrote: Has patient seen PCP for this complaint? yes  *If NO, is insurance requiring patient see PCP for this issue before PCP can refer them? Referral for which specialty: [facility]  Preferred provider/office: [facility] - [place],  [address] phone# [phone] Reason for referral: pt reports change in hearing

## 2018-11-07 NOTE — Telephone Encounter (Signed)
Left detailed voice message to patient that appointment is needed and call office to schedule.

## 2018-11-08 ENCOUNTER — Other Ambulatory Visit: Payer: Self-pay

## 2018-11-08 DIAGNOSIS — H9193 Unspecified hearing loss, bilateral: Secondary | ICD-10-CM

## 2018-11-08 NOTE — Telephone Encounter (Signed)
Referral has been placed. 

## 2018-11-14 DIAGNOSIS — H903 Sensorineural hearing loss, bilateral: Secondary | ICD-10-CM | POA: Diagnosis not present

## 2018-11-27 ENCOUNTER — Other Ambulatory Visit: Payer: Self-pay | Admitting: Family Medicine

## 2018-11-27 DIAGNOSIS — K219 Gastro-esophageal reflux disease without esophagitis: Secondary | ICD-10-CM

## 2018-11-27 DIAGNOSIS — E213 Hyperparathyroidism, unspecified: Secondary | ICD-10-CM

## 2018-11-27 DIAGNOSIS — E039 Hypothyroidism, unspecified: Secondary | ICD-10-CM

## 2018-11-27 DIAGNOSIS — D696 Thrombocytopenia, unspecified: Secondary | ICD-10-CM

## 2018-11-27 DIAGNOSIS — E7800 Pure hypercholesterolemia, unspecified: Secondary | ICD-10-CM

## 2018-11-27 DIAGNOSIS — I10 Essential (primary) hypertension: Secondary | ICD-10-CM

## 2018-12-27 ENCOUNTER — Telehealth: Payer: Self-pay | Admitting: Family Medicine

## 2018-12-27 ENCOUNTER — Other Ambulatory Visit: Payer: Self-pay | Admitting: Family Medicine

## 2018-12-27 NOTE — Telephone Encounter (Signed)
I called the patient to schedule AWV with Courtney, but there was no answer and no option to leave a message. VDM (Dee-Dee) °

## 2018-12-30 DIAGNOSIS — Z23 Encounter for immunization: Secondary | ICD-10-CM | POA: Diagnosis not present

## 2019-01-05 ENCOUNTER — Other Ambulatory Visit: Payer: Self-pay | Admitting: Family Medicine

## 2019-01-05 DIAGNOSIS — E7800 Pure hypercholesterolemia, unspecified: Secondary | ICD-10-CM

## 2019-01-05 DIAGNOSIS — K219 Gastro-esophageal reflux disease without esophagitis: Secondary | ICD-10-CM

## 2019-01-05 DIAGNOSIS — E039 Hypothyroidism, unspecified: Secondary | ICD-10-CM

## 2019-01-05 DIAGNOSIS — I10 Essential (primary) hypertension: Secondary | ICD-10-CM

## 2019-01-05 DIAGNOSIS — E213 Hyperparathyroidism, unspecified: Secondary | ICD-10-CM

## 2019-01-05 DIAGNOSIS — D696 Thrombocytopenia, unspecified: Secondary | ICD-10-CM

## 2019-01-23 DIAGNOSIS — H43813 Vitreous degeneration, bilateral: Secondary | ICD-10-CM | POA: Diagnosis not present

## 2019-01-23 DIAGNOSIS — H04123 Dry eye syndrome of bilateral lacrimal glands: Secondary | ICD-10-CM | POA: Diagnosis not present

## 2019-01-23 DIAGNOSIS — Z961 Presence of intraocular lens: Secondary | ICD-10-CM | POA: Diagnosis not present

## 2019-01-25 ENCOUNTER — Other Ambulatory Visit: Payer: Self-pay | Admitting: Family Medicine

## 2019-02-20 ENCOUNTER — Telehealth: Payer: Self-pay | Admitting: Family Medicine

## 2019-02-20 NOTE — Telephone Encounter (Signed)
I called the patient to schedule AWV with Loma Sousa and OV with Dr. Jerline Pain.  A female answered and said she wasn't there.  I asked her to have the patient to call us please. VDM (Dee-Dee)

## 2019-02-23 ENCOUNTER — Ambulatory Visit (INDEPENDENT_AMBULATORY_CARE_PROVIDER_SITE_OTHER): Payer: Medicare Other

## 2019-02-23 ENCOUNTER — Other Ambulatory Visit: Payer: Self-pay

## 2019-02-23 DIAGNOSIS — Z0000 Encounter for general adult medical examination without abnormal findings: Secondary | ICD-10-CM

## 2019-02-23 NOTE — Patient Instructions (Signed)
Jean Bowen , Thank you for taking time to come for your Medicare Wellness Visit. I appreciate your ongoing commitment to your health goals. Please review the following plan we discussed and let me know if I can assist you in the future.   Screening recommendations/referrals: Colorectal Screening: up to date; last 10/16/14 Mammogram: we will request your last records  Bone Density: we will request your last records   Vision and Dental Exams: Recommended annual ophthalmology exams for early detection of glaucoma and other disorders of the eye Recommended annual dental exams for proper oral hygiene  Vaccinations: Influenza vaccine: completed 12/30/18 Pneumococcal vaccine: up to date; last 03/17/16 Tdap vaccine: recommended; Please call your insurance company to determine your out of pocket expense. You may receive this vaccine at your local pharmacy or Health Dept. Shingles vaccine: Shingrix completed   Advanced directives: Please bring a copy of your POA (Power of Attorney) and/or Living Will to your next appointment.  Goals: Recommend to drink at least 6-8 8oz glasses of water per day and consume a balanced diet rich in fresh fruits and vegetables.   Next appointment: Please schedule your Annual Wellness Visit with your Nurse Health Advisor in one year.  Preventive Care 74 Years and Older, Female Preventive care refers to lifestyle choices and visits with your health care provider that can promote health and wellness. What does preventive care include?  A yearly physical exam. This is also called an annual well check.  Dental exams once or twice a year.  Routine eye exams. Ask your health care provider how often you should have your eyes checked.  Personal lifestyle choices, including:  Daily care of your teeth and gums.  Regular physical activity.  Eating a healthy diet.  Avoiding tobacco and drug use.  Limiting alcohol use.  Practicing safe sex.  Taking low-dose  aspirin every day if recommended by your health care provider.  Taking vitamin and mineral supplements as recommended by your health care provider. What happens during an annual well check? The services and screenings done by your health care provider during your annual well check will depend on your age, overall health, lifestyle risk factors, and family history of disease. Counseling  Your health care provider may ask you questions about your:  Alcohol use.  Tobacco use.  Drug use.  Emotional well-being.  Home and relationship well-being.  Sexual activity.  Eating habits.  History of falls.  Memory and ability to understand (cognition).  Work and work Statistician.  Reproductive health. Screening  You may have the following tests or measurements:  Height, weight, and BMI.  Blood pressure.  Lipid and cholesterol levels. These may be checked every 5 years, or more frequently if you are over 41 years old.  Skin check.  Lung cancer screening. You may have this screening every year starting at age 74 if you have a 30-pack-year history of smoking and currently smoke or have quit within the past 15 years.  Fecal occult blood test (FOBT) of the stool. You may have this test every year starting at age 74.  Flexible sigmoidoscopy or colonoscopy. You may have a sigmoidoscopy every 5 years or a colonoscopy every 10 years starting at age 74.  Hepatitis C blood test.  Hepatitis B blood test.  Sexually transmitted disease (STD) testing.  Diabetes screening. This is done by checking your blood sugar (glucose) after you have not eaten for a while (fasting). You may have this done every 1-3 years.  Bone density scan.  This is done to screen for osteoporosis. You may have this done starting at age 74.  Mammogram. This may be done every 1-2 years. Talk to your health care provider about how often you should have regular mammograms. Talk with your health care provider about your  test results, treatment options, and if necessary, the need for more tests. Vaccines  Your health care provider may recommend certain vaccines, such as:  Influenza vaccine. This is recommended every year.  Tetanus, diphtheria, and acellular pertussis (Tdap, Td) vaccine. You may need a Td booster every 10 years.  Zoster vaccine. You may need this after age 74.  Pneumococcal 13-valent conjugate (PCV13) vaccine. One dose is recommended after age 74.  Pneumococcal polysaccharide (PPSV23) vaccine. One dose is recommended after age 74. Talk to your health care provider about which screenings and vaccines you need and how often you need them. This information is not intended to replace advice given to you by your health care provider. Make sure you discuss any questions you have with your health care provider. Document Released: 05/02/2015 Document Revised: 12/24/2015 Document Reviewed: 02/04/2015 Elsevier Interactive Patient Education  2017 [place] Prevention in the Home Falls can cause injuries. They can happen to people of all ages. There are many things you can do to make your home safe and to help prevent falls. What can I do on the outside of my home?  Regularly fix the edges of walkways and driveways and fix any cracks.  Remove anything that might make you trip as you walk through a door, such as a raised step or threshold.  Trim any bushes or trees on the path to your home.  Use bright outdoor lighting.  Clear any walking paths of anything that might make someone trip, such as rocks or tools.  Regularly check to see if handrails are loose or broken. Make sure that both sides of any steps have handrails.  Any raised decks and porches should have guardrails on the edges.  Have any leaves, snow, or ice cleared regularly.  Use sand or salt on walking paths during winter.  Clean up any spills in your garage right away. This includes oil or grease spills. What can I  do in the bathroom?  Use night lights.  Install grab bars by the toilet and in the tub and shower. Do not use towel bars as grab bars.  Use non-skid mats or decals in the tub or shower.  If you need to sit down in the shower, use a plastic, non-slip stool.  Keep the floor dry. Clean up any water that spills on the floor as soon as it happens.  Remove soap buildup in the tub or shower regularly.  Attach bath mats securely with double-sided non-slip rug tape.  Do not have throw rugs and other things on the floor that can make you trip. What can I do in the bedroom?  Use night lights.  Make sure that you have a light by your bed that is easy to reach.  Do not use any sheets or blankets that are too big for your bed. They should not hang down onto the floor.  Have a firm chair that has side arms. You can use this for support while you get dressed.  Do not have throw rugs and other things on the floor that can make you trip. What can I do in the kitchen?  Clean up any spills right away.  Avoid walking on wet floors.  Keep items that you use a lot in easy-to-reach places.  If you need to reach something above you, use a strong step stool that has a grab bar.  Keep electrical cords out of the way.  Do not use floor polish or wax that makes floors slippery. If you must use wax, use non-skid floor wax.  Do not have throw rugs and other things on the floor that can make you trip. What can I do with my stairs?  Do not leave any items on the stairs.  Make sure that there are handrails on both sides of the stairs and use them. Fix handrails that are broken or loose. Make sure that handrails are as long as the stairways.  Check any carpeting to make sure that it is firmly attached to the stairs. Fix any carpet that is loose or worn.  Avoid having throw rugs at the top or bottom of the stairs. If you do have throw rugs, attach them to the floor with carpet tape.  Make sure that  you have a light switch at the top of the stairs and the bottom of the stairs. If you do not have them, ask someone to add them for you. What else can I do to help prevent falls?  Wear shoes that:  Do not have high heels.  Have rubber bottoms.  Are comfortable and fit you well.  Are closed at the toe. Do not wear sandals.  If you use a stepladder:  Make sure that it is fully opened. Do not climb a closed stepladder.  Make sure that both sides of the stepladder are locked into place.  Ask someone to hold it for you, if possible.  Clearly mark and make sure that you can see:  Any grab bars or handrails.  First and last steps.  Where the edge of each step is.  Use tools that help you move around (mobility aids) if they are needed. These include:  Canes.  Walkers.  Scooters.  Crutches.  Turn on the lights when you go into a dark area. Replace any light bulbs as soon as they burn out.  Set up your furniture so you have a clear path. Avoid moving your furniture around.  If any of your floors are uneven, fix them.  If there are any pets around you, be aware of where they are.  Review your medicines with your doctor. Some medicines can make you feel dizzy. This can increase your chance of falling. Ask your doctor what other things that you can do to help prevent falls. This information is not intended to replace advice given to you by your health care provider. Make sure you discuss any questions you have with your health care provider. Document Released: 01/30/2009 Document Revised: 09/11/2015 Document Reviewed: 05/10/2014 Elsevier Interactive Patient Education  2017 [employer].

## 2019-02-23 NOTE — Progress Notes (Signed)
I have personally reviewed the Medicare Annual Wellness Visit and agree with the assessment and plan.  Algis Greenhouse. Jerline Pain, MD 02/23/2019 3:12 PM

## 2019-02-23 NOTE — Progress Notes (Signed)
I connected with Jean Bowen on 02/23/19 at 1430 by phone and verified that I am speaking with the correct person using two identifiers. Location patient: Home Location provider: [facility], Office Persons participating in the virtual visit: Denman George LPN and Dr. Dimas Chyle   I discussed the limitations of evaluation and management by telemedicine and the availability of in person appointments. The patient expressed understanding and agreed to proceed.  Subjective:   Jean Bowen is a 73 y.o. female who presents for Medicare Annual (Subsequent) preventive examination.  Review of Systems:   Cardiac Risk Factors include: advanced age (>47mn, >>68women);hypertension;dyslipidemia     Objective:     Vitals: There were no vitals taken for this visit.  There is no height or weight on file to calculate BMI.  Advanced Directives 02/23/2019 03/17/2016 03/19/2015 02/11/2015 10/18/2014  Does Patient Have a Medical Advance Directive? Yes No No No Yes  Type of Advance Directive Living will;Healthcare Power of Attorney - - - Living will;Healthcare Power of Attorney  Does patient want to make changes to medical advance directive? No - Patient declined - - - -  Copy of [place] Chart? No - copy requested - - - -  Would patient like information on creating a medical advance directive? - - No - patient declined information - -    Tobacco Social History   Tobacco Use  Smoking Status Former Smoker  . Packs/day: 2.00  . Years: 30.00  . Pack years: 60.00  . Types: Cigarettes  . Quit date: 05/22/2001  . Years since quitting: 17.7  Smokeless Tobacco Never Used     Counseling given: Not Answered   Clinical Intake:  Pre-visit preparation completed: Yes  Pain : No/denies pain  Diabetes: No  How often do you need to have someone help you when you read instructions, pamphlets, or other written materials from your doctor or pharmacy?: 1 - Never  Interpreter  Needed?: No  Information entered by :: CDenman GeorgeLPN  Past Medical History:  Diagnosis Date  . ANGIOEDEMA 08/22/09  . DEPRESSION 12/03/2006  . DERMATITIS, PERIORAL 01/08/2010  . Diverticulosis   . GERD 03/03/2007  . Hiatal hernia   . History of benign gastric tumor    significant for a benign gastric tumor that weighed approximately 5 pounds and was resected.  .Marland KitchenHYPERCHOLESTEROLEMIA 02/23/2010  . Hyperplastic colon polyp 12/03/2006  . HYPERTENSION 12/03/2006  . OSA (obstructive sleep apnea)   . OSTEOPOROSIS 12/03/2006  . Sleep apnea   . URINARY INCONTINENCE 12/03/2006   Past Surgical History:  Procedure Laterality Date  . BUNIONECTOMY     both feet  . CATARACT EXTRACTION     Bil  . CHOLECYSTECTOMY    . ELECTROCARDIOGRAM  02/07/2006  . ESOPHAGOGASTRODUODENOSCOPY  11/17/2006  . Hx of nasal septal reconstruction for deviated septum  1980's  . KNEE ARTHROSCOPY     Bilateral-torn meniscus  . 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 Stress Cardiolite  02/14/2006  . TUMOR REMOVAL  2003   removal 5 lb benign tumor from abdomen   Family History  Problem Relation Age of Onset  . Melanoma Mother   . Lung cancer Mother   . Colon cancer Father        at advanced age  . Arthritis Father   . Lung cancer Brother   . Heart attack Other        grandfather  .  Diabetes Other        grandfather/grandmother   Social History   Socioeconomic History  . Marital status: Widowed    Spouse name: Novalie Leamy   . Number of children: Not on file  . Years of education: Not on file  . Highest education level: Not on file  Occupational History  . Occupation: Company secretary and travel  [employer]  . Financial resource strain: Not on file  . Food insecurity    Worry: Not on file    Inability: Not on file  . Transportation needs    Medical: Not on file    Non-medical: Not on file  Tobacco Use  . Smoking status: Former Smoker     Packs/day: 2.00    Years: 30.00    Pack years: 60.00    Types: Cigarettes    Quit date: 05/22/2001    Years since quitting: 17.7  . Smokeless tobacco: Never Used  Substance and Sexual Activity  . Alcohol use: Yes    Alcohol/week: 3.0 standard drinks    Types: 3 Glasses of wine per week    Comment: 1-2/week  . Drug use: No  . Sexual activity: Not on file  Lifestyle  . Physical activity    Days per week: Not on file    Minutes per session: Not on file  . Stress: Not on file  Relationships  . Social Herbalist on phone: Not on file    Gets together: Not on file    Attends religious service: Not on file    Active member of club or organization: Not on file    Attends meetings of clubs or organizations: Not on file    Relationship status: Not on file  Other Topics Concern  . Not on file  Social History Narrative   1 dog   Regular exercise-yes   Husband died 09/10/2018       Outpatient Encounter Medications as of 02/23/2019  Medication Sig  . acyclovir (ZOVIRAX) 400 MG tablet TAKE TWO TABLETS BY MOUTH EVERY 4 HOURS WHILE AWAKE  . amLODipine (NORVASC) 2.5 MG tablet TAKE ONE TABLET BY MOUTH DAILY  . atorvastatin (LIPITOR) 10 MG tablet TAKE ONE TABLET BY MOUTH DAILY  . escitalopram (LEXAPRO) 10 MG tablet TAKE ONE TABLET BY MOUTH DAILY  . hydrOXYzine (ATARAX/VISTARIL) 10 MG tablet Take 1 tablet (10 mg total) by mouth 3 (three) times daily as needed for itching.  Marland Kitchen ibuprofen (ADVIL,MOTRIN) 100 MG/5ML suspension Take 200 mg by mouth as needed.  Marland Kitchen levothyroxine (SYNTHROID) 25 MCG tablet TAKE ONE TABLET BY MOUTH DAILY BEFORE BREAKFAST  . omeprazole (PRILOSEC) 40 MG capsule Take 1 capsule (40 mg total) by mouth daily.  Marland Kitchen oxybutynin (DITROPAN) 5 MG tablet Take 1 tablet (5 mg total) by mouth 3 (three) times daily.  . valACYclovir (VALTREX) 1000 MG tablet 2 pills at outbreak onset repeat in 12h for each outbreak  . Zoster Vaccine Adjuvanted [facility]) injection Shingrix (PF) 50  mcg/0.5 mL intramuscular suspension, kit   No facility-administered encounter medications on file as of 02/23/2019.     Activities of Daily Living In your present state of health, do you have any difficulty performing the following activities: 02/23/2019  Hearing? Y  Comment followed by audiology  Vision? N  Difficulty concentrating or making decisions? N  Walking or climbing stairs? N  Dressing or bathing? N  Doing errands, shopping? N  Preparing Food and eating ? N  Using the Toilet? N  In the past six months, have you accidently leaked urine? N  Do you have problems with loss of bowel control? N  Managing your Medications? N  Managing your Finances? N  Housekeeping or managing your Housekeeping? N  Some recent data might be hidden    Patient Care Team: Vivi Barrack, MD as PCP - General (Family Medicine) Gaynelle Arabian, MD as Attending Physician (Orthopedic Surgery) Lafayette Dragon, MD (Inactive) as Attending Physician (Gastroenterology) Jarome Matin, MD as Consulting Physician (Dermatology) Clent Jacks, MD as Consulting Physician (Ophthalmology) Lonzo Candy, AUD as Consulting Physician (Audiology)    Assessment:   This is a routine wellness examination for Jean Bowen.  Exercise Activities and Dietary recommendations Current Exercise Habits: The patient does not participate in regular exercise at present;Home exercise routine, Type of exercise: walking, Time (Minutes): 35, Frequency (Times/Week): 5, Weekly Exercise (Minutes/Week): 175, Intensity: Mild  Goals   None     Fall Risk Fall Risk  02/23/2019 02/16/2018 02/08/2017  Falls in the past year? 0 No No  Injury with Fall? 0 - -  Follow up Falls evaluation completed;Education provided;Falls prevention discussed - -   Is the patient's home free of loose throw rugs in walkways, pet beds, electrical cords, etc?   yes      Grab bars in the bathroom? yes      Handrails on the stairs?   yes      Adequate lighting?   yes    Depression Screen PHQ 2/9 Scores 02/23/2019 02/16/2018 02/08/2017 11/23/2014  PHQ - 2 Score 0 0 0 0     Cognitive Function-no cognitive concerns at this time    6CIT Screen 02/23/2019  What Year? 0 points  What month? 0 points  What time? 0 points  Count back from 20 0 points  Months in reverse 0 points  Repeat phrase 0 points  Total Score 0    Immunization History  Administered Date(s) Administered  . Influenza Whole 03/03/2007, 01/16/2008, 01/17/2009, 02/23/2010  . Influenza, High Dose Seasonal PF 03/01/2016  . Influenza,inj,Quad PF,6+ Mos 01/09/2014, 02/11/2015  . Influenza-Unspecified 01/03/2012, 01/14/2017, 12/20/2017, 12/30/2018  . Pneumococcal Conjugate-13 01/09/2014  . Pneumococcal Polysaccharide-23 03/03/2007, 03/17/2016  . Td 02/07/2006  . Zoster 04/19/2005  . Zoster Recombinat (Shingrix) 02/07/2017    Qualifies for Shingles Vaccine? Shingrix completed at pharmacy  Screening Tests Health Maintenance  Topic Date Due  . TETANUS/TDAP  02/08/2016  . MAMMOGRAM  04/02/2018  . COLONOSCOPY  10/16/2019  . INFLUENZA VACCINE  Completed  . DEXA SCAN  Completed  . Hepatitis C Screening  Completed  . PNA vac Low Risk Adult  Completed    Cancer Screenings: Lung: Low Dose CT Chest recommended if Age 39-80 years, 30 pack-year currently smoking OR have quit w/in 15years. Patient does not qualify. Breast:  Up to date on Mammogram? Yes   Up to date of Bone Density/Dexa? Yes Colorectal: colonoscopy 10/16/14    Plan:  I have personally reviewed and addressed the Medicare Annual Wellness questionnaire and have noted the following in the patient's chart:  A. Medical and social history B. Use of alcohol, tobacco or illicit drugs  C. Current medications and supplements D. Functional ability and status E.  Nutritional status F.  Physical activity G. Advance directives H. List of other physicians I.  Hospitalizations, surgeries, and ER visits in previous 12 months J.   [place] such as hearing and vision if needed, cognitive and depression L. Referrals, records requested, and appointments- will request  mammogram and last dexa records   In addition, I have reviewed and discussed with patient certain preventive protocols, quality metrics, and best practice recommendations. A written personalized care plan for preventive services as well as general preventive health recommendations were provided to patient.   Signed,  Denman George, LPN  Nurse Health Advisor   Nurse Notes: Patient is requesting an rx of acyclovir cream.  She states that this

## 2019-02-26 NOTE — Progress Notes (Signed)
Called pt to schedule, no answer, LVM.  °

## 2019-03-04 ENCOUNTER — Other Ambulatory Visit: Payer: Self-pay | Admitting: Family Medicine

## 2019-03-04 DIAGNOSIS — K219 Gastro-esophageal reflux disease without esophagitis: Secondary | ICD-10-CM

## 2019-03-04 DIAGNOSIS — I10 Essential (primary) hypertension: Secondary | ICD-10-CM

## 2019-03-04 DIAGNOSIS — D696 Thrombocytopenia, unspecified: Secondary | ICD-10-CM

## 2019-03-04 DIAGNOSIS — E039 Hypothyroidism, unspecified: Secondary | ICD-10-CM

## 2019-03-04 DIAGNOSIS — E213 Hyperparathyroidism, unspecified: Secondary | ICD-10-CM

## 2019-03-04 DIAGNOSIS — E7800 Pure hypercholesterolemia, unspecified: Secondary | ICD-10-CM

## 2019-03-07 DIAGNOSIS — D1801 Hemangioma of skin and subcutaneous tissue: Secondary | ICD-10-CM | POA: Diagnosis not present

## 2019-03-07 DIAGNOSIS — D224 Melanocytic nevi of scalp and neck: Secondary | ICD-10-CM | POA: Diagnosis not present

## 2019-03-07 DIAGNOSIS — L820 Inflamed seborrheic keratosis: Secondary | ICD-10-CM | POA: Diagnosis not present

## 2019-03-07 DIAGNOSIS — D2371 Other benign neoplasm of skin of right lower limb, including hip: Secondary | ICD-10-CM | POA: Diagnosis not present

## 2019-03-07 DIAGNOSIS — Z85828 Personal history of other malignant neoplasm of skin: Secondary | ICD-10-CM | POA: Diagnosis not present

## 2019-03-07 DIAGNOSIS — D225 Melanocytic nevi of trunk: Secondary | ICD-10-CM | POA: Diagnosis not present

## 2019-03-07 DIAGNOSIS — L570 Actinic keratosis: Secondary | ICD-10-CM | POA: Diagnosis not present

## 2019-03-07 DIAGNOSIS — D485 Neoplasm of uncertain behavior of skin: Secondary | ICD-10-CM | POA: Diagnosis not present

## 2019-03-13 ENCOUNTER — Encounter: Payer: Self-pay | Admitting: Family Medicine

## 2019-03-13 ENCOUNTER — Other Ambulatory Visit: Payer: Self-pay

## 2019-03-13 ENCOUNTER — Ambulatory Visit (INDEPENDENT_AMBULATORY_CARE_PROVIDER_SITE_OTHER): Payer: Medicare Other | Admitting: Family Medicine

## 2019-03-13 VITALS — BP 141/89 | HR 81 | Temp 98.3°F | Ht 68.0 in | Wt 190.2 lb

## 2019-03-13 DIAGNOSIS — E663 Overweight: Secondary | ICD-10-CM

## 2019-03-13 DIAGNOSIS — E039 Hypothyroidism, unspecified: Secondary | ICD-10-CM

## 2019-03-13 DIAGNOSIS — E7800 Pure hypercholesterolemia, unspecified: Secondary | ICD-10-CM | POA: Diagnosis not present

## 2019-03-13 DIAGNOSIS — I10 Essential (primary) hypertension: Secondary | ICD-10-CM | POA: Diagnosis not present

## 2019-03-13 DIAGNOSIS — F325 Major depressive disorder, single episode, in full remission: Secondary | ICD-10-CM | POA: Diagnosis not present

## 2019-03-13 DIAGNOSIS — E213 Hyperparathyroidism, unspecified: Secondary | ICD-10-CM | POA: Diagnosis not present

## 2019-03-13 DIAGNOSIS — D696 Thrombocytopenia, unspecified: Secondary | ICD-10-CM | POA: Diagnosis not present

## 2019-03-13 DIAGNOSIS — Z6828 Body mass index (BMI) 28.0-28.9, adult: Secondary | ICD-10-CM | POA: Diagnosis not present

## 2019-03-13 DIAGNOSIS — L282 Other prurigo: Secondary | ICD-10-CM

## 2019-03-13 DIAGNOSIS — K219 Gastro-esophageal reflux disease without esophagitis: Secondary | ICD-10-CM | POA: Diagnosis not present

## 2019-03-13 MED ORDER — ESCITALOPRAM OXALATE 10 MG PO TABS: 10.0000 mg | ORAL_TABLET | Freq: Every day | ORAL | 3 refills | Status: DC

## 2019-03-13 MED ORDER — AMLODIPINE BESYLATE 2.5 MG PO TABS: 2.5000 mg | ORAL_TABLET | Freq: Every day | ORAL | 3 refills | Status: DC

## 2019-03-13 MED ORDER — ATORVASTATIN CALCIUM 10 MG PO TABS: 10.0000 mg | ORAL_TABLET | Freq: Every day | ORAL | 3 refills | Status: DC

## 2019-03-13 MED ORDER — HYDROXYZINE HCL 10 MG PO TABS: 10.0000 mg | ORAL_TABLET | Freq: Three times a day (TID) | ORAL | 1 refills | Status: DC | PRN

## 2019-03-13 MED ORDER — LEVOTHYROXINE SODIUM 25 MCG PO TABS: 25.0000 ug | ORAL_TABLET | Freq: Every day | ORAL | 1 refills | Status: DC

## 2019-03-13 MED ORDER — OXYBUTYNIN CHLORIDE 5 MG PO TABS: 5.0000 mg | ORAL_TABLET | Freq: Three times a day (TID) | ORAL | 3 refills | Status: DC

## 2019-03-13 NOTE — Assessment & Plan Note (Signed)
Check C met.

## 2019-03-13 NOTE — Assessment & Plan Note (Signed)
Check lipid panel.  She is currently off statin.  May need to restart depending on result.

## 2019-03-13 NOTE — Assessment & Plan Note (Signed)
Check CBC 

## 2019-03-13 NOTE — Assessment & Plan Note (Signed)
At goal per JNC 8.  Continue Norvasc 2.5 mg daily.  Check CBC, C met, TSH.

## 2019-03-13 NOTE — Assessment & Plan Note (Signed)
Check TSH.  Continue Synthroid 25 mcg daily. 

## 2019-03-13 NOTE — Assessment & Plan Note (Signed)
Check C met.  Continue Prilosec 40 mg daily.

## 2019-03-13 NOTE — Patient Instructions (Signed)
It was very nice to see you today!  I will refill your meds.  We will check labs today.  Come back in a year or sooner if needed.   Take care, Dr Jerline Pain  Please try these tips to maintain a healthy lifestyle:   Eat at least 3 REAL meals and 1-2 snacks per day.  Aim for no more than 5 hours between eating.  If you eat breakfast, please do so within one hour of getting up.    Obtain twice as many fruits/vegetables as protein or carbohydrate foods for both lunch and dinner. (Half of each meal should be fruits/vegetables, one quarter protein, and one quarter starchy carbs)   Cut down on sweet beverages. This includes juice, soda, and sweet tea.    Exercise at least 150 minutes every week.    Preventive Care 74 Years and Older, Female Preventive care refers to lifestyle choices and visits with your health care provider that can promote health and wellness. This includes:  A yearly physical exam. This is also called an annual well check.  Regular dental and eye exams.  Immunizations.  Screening for certain conditions.  Healthy lifestyle choices, such as diet and exercise. What can I expect for my preventive care visit? Physical exam Your health care provider will check:  Height and weight. These may be used to calculate body mass index (BMI), which is a measurement that tells if you are at a healthy weight.  Heart rate and blood pressure.  Your skin for abnormal spots. Counseling Your health care provider may ask you questions about:  Alcohol, tobacco, and drug use.  Emotional well-being.  Home and relationship well-being.  Sexual activity.  Eating habits.  History of falls.  Memory and ability to understand (cognition).  Work and work Statistician.  Pregnancy and menstrual history. What immunizations do I need?  Influenza (flu) vaccine  This is recommended every year. Tetanus, diphtheria, and pertussis (Tdap) vaccine  You may need a Td booster every  10 years. Varicella (chickenpox) vaccine  You may need this vaccine if you have not already been vaccinated. Zoster (shingles) vaccine  You may need this after age 74. Pneumococcal conjugate (PCV13) vaccine  One dose is recommended after age 74. Pneumococcal polysaccharide (PPSV23) vaccine  One dose is recommended after age 74. Measles, mumps, and rubella (MMR) vaccine  You may need at least one dose of MMR if you were born in 1957 or later. You may also need a second dose. Meningococcal conjugate (MenACWY) vaccine  You may need this if you have certain conditions. Hepatitis A vaccine  You may need this if you have certain conditions or if you travel or work in places where you may be exposed to hepatitis A. Hepatitis B vaccine  You may need this if you have certain conditions or if you travel or work in places where you may be exposed to hepatitis B. Haemophilus influenzae type b (Hib) vaccine  You may need this if you have certain conditions. You may receive vaccines as individual doses or as more than one vaccine together in one shot (combination vaccines). Talk with your health care provider about the risks and benefits of combination vaccines. What tests do I need? Blood tests  Lipid and cholesterol levels. These may be checked every 5 years, or more frequently depending on your overall health.  Hepatitis C test.  Hepatitis B test. Screening  Lung cancer screening. You may have this screening every year starting at age 74 if  you have a 30-pack-year history of smoking and currently smoke or have quit within the past 15 years.  Colorectal cancer screening. All adults should have this screening starting at age 74 and continuing until age 74. Your health care provider may recommend screening at age 74 if you are at increased risk. You will have tests every 1-10 years, depending on your results and the type of screening test.  Diabetes screening. This is done by checking  your blood sugar (glucose) after you have not eaten for a while (fasting). You may have this done every 1-3 years.  Mammogram. This may be done every 1-2 years. Talk with your health care provider about how often you should have regular mammograms.  BRCA-related cancer screening. This may be done if you have a family history of breast, ovarian, tubal, or peritoneal cancers. Other tests  Sexually transmitted disease (STD) testing.  Bone density scan. This is done to screen for osteoporosis. You may have this done starting at age 74 Follow these instructions at home: Eating and drinking  Eat a diet that includes fresh fruits and vegetables, whole grains, lean protein, and low-fat dairy products. Limit your intake of foods with high amounts of sugar, saturated fats, and salt.  Take vitamin and mineral supplements as recommended by your health care provider.  Do not drink alcohol if your health care provider tells you not to drink.  If you drink alcohol: ? Limit how much you have to 0-1 drink a day. ? Be aware of how much alcohol is in your drink. In the [place] one drink equals one 12 oz bottle of beer (355 mL), one 5 oz glass of wine (148 mL), or one 1 oz glass of hard liquor (44 mL). Lifestyle  Take daily care of your teeth and gums.  Stay active. Exercise for at least 30 minutes on 5 or more days each week.  Do not use any products that contain nicotine or tobacco, such as cigarettes, e-cigarettes, and chewing tobacco. If you need help quitting, ask your health care provider.  If you are sexually active, practice safe sex. Use a condom or other form of protection in order to prevent STIs (sexually transmitted infections).  Talk with your health care provider about taking a low-dose aspirin or statin. What's next?  Go to your health care provider once a year for a well check visit.  Ask your health care provider how often you should have your eyes and teeth checked.  Stay up to  date on all vaccines. This information is not intended to replace advice given to you by your health care provider. Make sure you discuss any questions you have with your health care provider. Document Released: 05/02/2015 Document Revised: 03/30/2018 Document Reviewed: 03/30/2018 Elsevier Patient Education  2020 [employer].

## 2019-03-13 NOTE — Progress Notes (Signed)
Chief Complaint:  Jean Bowen is a 73 y.o. female who presents today with a chief complaint of abdominal fullness.   Assessment/Plan:  Abdominal Fullness Benign exam.  No red flags.  Reassured patient.  We will continue with watchful waiting.  GERD Check C met.  Continue Prilosec 40 mg daily.  Essential hypertension At goal per JNC 8.  Continue Norvasc 2.5 mg daily.  Check CBC, C met, TSH.  Major depressive disorder, single episode, in remission ([place]) Stable.  Continue Lexapro 10 mg daily.  HYPERCHOLESTEROLEMIA Check lipid panel.  She is currently off statin.  May need to restart depending on result.  Hypothyroidism Check TSH.  Continue Synthroid 25 mcg daily.  Hyperparathyroidism ([place]) Check C met.  Thrombocytopenia ([facility]) Check CBC.     Subjective:  HPI:  She has had some fullness in the left side of her abdomen for the past few months.  No pain.  No bloating.  No early satiety.  No weight loss.  Patient is concerned because she has had a tumor removed from her abdomen in the past weight 5 pounds.  Her stable, chronic medical conditions are outlined below:  #Essential hypertension - On Norvasc 2.5 mg daily.  Tolerating well - ROS: No reported chest pain or shortness of breath  #Dyslipidemia - On statin 40 mg daily.  Tolerating well. - ROS: No reported myalgias  Hypothyroidism - On synthroid 58mg daily.  Tolerating well. # Depression - On lexapro 118mdaily and tolerating well - ROS: No reported SI or HI  # Pruritus - On hydroxyzine 1037mhree times daily as needed  # GERD - On prilosec 57m54mily and tolerating wel  # OAB - On oxybutynin 5mg 59mee time sdail  ROS: Per HPI  PMH: She reports that she quit smoking about 17 years ago. Her smoking use included cigarettes. She has a 60.00 pack-year smoking history. She has never used smokeless tobacco. She reports current alcohol use of about 3.0 standard drinks of alcohol per week. She reports that she  does not use drugs.      Objective:  Physical Exam: BP (!) 141/89   Pulse 81   Temp 98.3 F (36.8 C)   Ht 5' 8" (1.727 m)   Wt 190 lb 3.2 oz (86.3 kg)   SpO2 93%   BMI 28.92 kg/m   Wt Readings from Last 3 Encounters:  03/13/19 190 lb 3.2 oz (86.3 kg)  02/16/18 181 lb (82.1 kg)  02/08/17 185 lb (83.9 kg)  Gen: NAD, resting comfortably CV: Regular rate and rhythm with no murmurs appreciated Pulm: Normal work of breathing, clear to auscultation bilaterally with no crackles, wheezes, or rhonchi GI: Normal bowel sounds present. Soft, Nontender, Nondistended.  No appreciable masses. MSK: No edema, cyanosis, or clubbing noted Skin: Warm, dry Neuro: Grossly normal, moves all extremities Psych: Normal affect and thought content  No results found for this or any previous visit (from the past 24 hour(s)).      CalebAlgis GreenhousekeJerline Pain11/24/2020 4:37 PM

## 2019-03-13 NOTE — Assessment & Plan Note (Signed)
Stable.  Continue Lexapro 10 mg daily. 

## 2019-03-14 LAB — COMPREHENSIVE METABOLIC PANEL WITH GFR
ALT: 26 U/L (ref 0–35)
AST: 20 U/L (ref 0–37)
Albumin: 4 g/dL (ref 3.5–5.2)
Alkaline Phosphatase: 79 U/L (ref 39–117)
BUN: 14 mg/dL (ref 6–23)
CO2: 31 meq/L (ref 19–32)
Calcium: 9.6 mg/dL (ref 8.4–10.5)
Chloride: 101 meq/L (ref 96–112)
Creatinine, Ser: 0.65 mg/dL (ref 0.40–1.20)
GFR: 89.09 mL/min
Glucose, Bld: 107 mg/dL — ABNORMAL HIGH (ref 70–99)
Potassium: 3.9 meq/L (ref 3.5–5.1)
Sodium: 140 meq/L (ref 135–145)
Total Bilirubin: 0.5 mg/dL (ref 0.2–1.2)
Total Protein: 7.1 g/dL (ref 6.0–8.3)

## 2019-03-14 LAB — LDL CHOLESTEROL, DIRECT: Direct LDL: 38 mg/dL

## 2019-03-14 LAB — CBC
HCT: 43.3 % (ref 36.0–46.0)
Hemoglobin: 14.5 g/dL (ref 12.0–15.0)
MCHC: 33.6 g/dL (ref 30.0–36.0)
MCV: 93.9 fl (ref 78.0–100.0)
Platelets: 216 10*3/uL (ref 150.0–400.0)
RBC: 4.61 Mil/uL (ref 3.87–5.11)
RDW: 14 % (ref 11.5–15.5)
WBC: 6.2 10*3/uL (ref 4.0–10.5)

## 2019-03-14 LAB — LIPID PANEL
Cholesterol: 164 mg/dL (ref 0–200)
HDL: 49.7 mg/dL (ref >39.00)
NonHDL: 113.93
Total CHOL/HDL Ratio: 3
Triglycerides: 302 mg/dL — ABNORMAL HIGH (ref 0.0–149.0)
VLDL: 60.4 mg/dL — ABNORMAL HIGH (ref 0.0–40.0)

## 2019-03-14 LAB — TSH: TSH: 2.81 u[IU]/mL (ref 0.35–4.50)

## 2019-03-19 ENCOUNTER — Encounter: Payer: Self-pay | Admitting: Family Medicine

## 2019-03-19 DIAGNOSIS — R739 Hyperglycemia, unspecified: Secondary | ICD-10-CM | POA: Insufficient documentation

## 2019-03-19 NOTE — Progress Notes (Signed)
Please inform patient of the following:  Blood sugar is slightly elevated, but all of her other labs are NORMAL. Do not need to make any medication changes at this point. Recommend she keep up the good work with diet and exercise and we can recheck in a year or so.  Jean Bowen. Jerline Pain, MD 03/19/2019 1:06 PM

## 2019-03-21 ENCOUNTER — Other Ambulatory Visit: Payer: Self-pay | Admitting: Family Medicine

## 2019-03-21 DIAGNOSIS — E039 Hypothyroidism, unspecified: Secondary | ICD-10-CM

## 2019-03-21 DIAGNOSIS — K219 Gastro-esophageal reflux disease without esophagitis: Secondary | ICD-10-CM

## 2019-03-21 DIAGNOSIS — E213 Hyperparathyroidism, unspecified: Secondary | ICD-10-CM

## 2019-03-21 DIAGNOSIS — D696 Thrombocytopenia, unspecified: Secondary | ICD-10-CM

## 2019-03-21 DIAGNOSIS — I10 Essential (primary) hypertension: Secondary | ICD-10-CM

## 2019-03-21 DIAGNOSIS — E7800 Pure hypercholesterolemia, unspecified: Secondary | ICD-10-CM

## 2019-04-02 ENCOUNTER — Ambulatory Visit: Payer: Self-pay | Admitting: *Deleted

## 2019-04-02 ENCOUNTER — Encounter: Payer: Self-pay | Admitting: Family Medicine

## 2019-04-02 ENCOUNTER — Ambulatory Visit: Payer: Medicare Other | Admitting: Physician Assistant

## 2019-04-02 ENCOUNTER — Ambulatory Visit (INDEPENDENT_AMBULATORY_CARE_PROVIDER_SITE_OTHER): Payer: Medicare Other

## 2019-04-02 ENCOUNTER — Ambulatory Visit (INDEPENDENT_AMBULATORY_CARE_PROVIDER_SITE_OTHER): Payer: Medicare Other | Admitting: Family Medicine

## 2019-04-02 VITALS — BP 143/96 | HR 83 | Temp 97.7°F | Ht 68.0 in | Wt 192.0 lb

## 2019-04-02 DIAGNOSIS — M25532 Pain in left wrist: Secondary | ICD-10-CM

## 2019-04-02 MED ORDER — DICLOFENAC SODIUM 75 MG PO TBEC: 75.0000 mg | DELAYED_RELEASE_TABLET | Freq: Two times a day (BID) | ORAL | 0 refills | Status: DC

## 2019-04-02 NOTE — Telephone Encounter (Signed)
Pt called in c/o left wrist pain, swelling and bruising.   Her dog tripped her Saturday afternoon.   It hurts to move or use the left wrist and hand.  I went over the care advice.  She was already doing the things mentioned.   I warm transferred her into the office to be scheduled to [facility].   I sent my notes to the office.  Reason for Disposition . [1] Large swelling or bruise around joint (wrist, elbow, shoulder) AND [2] can't move injured arm normally (bend or straighten completely)  Answer Assessment - Initial Assessment Questions 1. MECHANISM: "How did the injury happen?"     My dog tripped me on Saturday.    M 2. ONSET: "When did the injury happen?" (Minutes or hours ago)      Saturday afternoon 3. LOCATION: "Where is the injury located?"      Left arm  4. APPEARANCE of INJURY: "What does the injury look like?"      Swollen and bruised.      The wrist area.   The hand is swollen too 5. SEVERITY: "Can you use the arm normally?"      I use 2 hands to lift a glass  Because it hurts.  I'm left handed. 6. SWELLING or BRUISING: "is there any swelling or bruising?" If so, ask: "How large is it? (e.g., inches, centimeters)      Around The wrist I can't get my watch on. 7. PAIN: "Is there pain?" If so, ask: "How bad is the pain?"    (Scale 1-10; or mild, moderate, severe)     Tylenol is helping.  Hurts without the Tylenol. 8. TETANUS: For any breaks in the skin, ask: "When was the last tetanus booster?"     No 9. OTHER SYMPTOMS: "Do you have any other symptoms?"  (e.g., numbness in hand)     No numbness or tingling 10. PREGNANCY: "Is there any chance you are pregnant?" "When was your last menstrual period?"       N/A  Protocols used: ARM INJURY-A-AH

## 2019-04-02 NOTE — Progress Notes (Signed)
   Chief Complaint:  Jean Bowen is a 73 y.o. female who presents today with a chief complaint of wrist pain.   Assessment/Plan:  Left wrist pain Plain film without obvious fracture based on my read-we will await radiology read.  Likely has sprain/contusion.  Will place in wrist splint today.  Recommended ice as needed.  Discussed home exercises and handout was given.  We will also start diclofenac 75 mg twice daily for the next 1 to 2 weeks.  Discussed reasons to return to care.  Follow-up as needed.    Subjective:  HPI:  Wrist Pain  Started 2 days ago. Tripped while walking her dog.  Landed on her left side.  Immediately noticed pain to her left wrist and back of her left hand.  Symptoms have been stable over the last couple of days.  Concerned about a broken wrist.  Worse with certain motions.  Worse with pressure.  No other obvious aggravating or alleviating factors.  ROS: Per HPI  PMH: She reports that she quit smoking about 17 years ago. Her smoking use included cigarettes. She has a 60.00 pack-year smoking history. She has never used smokeless tobacco. She reports current alcohol use of about 3.0 standard drinks of alcohol per week. She reports that she does not use drugs.      Objective:  Physical Exam: BP (!) 143/96   Pulse 83   Temp 97.7 F (36.5 C)   Ht 5\' 8"  (1.727 m)   Wt 192 lb (87.1 kg)   SpO2 97%   BMI 29.19 kg/m   Gen: NAD, resting comfortably MSK: Left hand with slight edema on dorsal aspect.  Tender to palpation along first second and third metacarpals.  No snuffbox tenderness.  Neurovascular intact distally.     Algis Greenhouse. Jerline Pain, MD 04/02/2019 12:17 PM

## 2019-04-02 NOTE — Telephone Encounter (Signed)
Patient scheduled.

## 2019-04-02 NOTE — Telephone Encounter (Signed)
See note

## 2019-04-02 NOTE — Patient Instructions (Signed)
It was very nice to see you today!  I think you have a sprain.  Please try using the brace as much as possible for the next couple of weeks.  Please take the diclofenac twice daily.  You can use ice as needed.  Letter know if your symptoms worsening or not improving the next 1 to 2 weeks.  Take care, Dr Jerline Pain  Please try these tips to maintain a healthy lifestyle:   Eat at least 3 REAL meals and 1-2 snacks per day.  Aim for no more than 5 hours between eating.  If you eat breakfast, please do so within one hour of getting up.    Each meal should contain half fruits/vegetables, one quarter protein, and one quarter carbs (no bigger than a computer mouse)   Cut down on sweet beverages. This includes juice, soda, and sweet tea.     Drink at least 1 glass of water with each meal and aim for at least 8 glasses per day   Exercise at least 150 minutes every week.

## 2019-04-03 ENCOUNTER — Other Ambulatory Visit: Payer: Self-pay

## 2019-04-03 DIAGNOSIS — S62102A Fracture of unspecified carpal bone, left wrist, initial encounter for closed fracture: Secondary | ICD-10-CM

## 2019-04-03 NOTE — Progress Notes (Signed)
Please inform patient of the following:  Radiology saw a very small fracture in her wrist. Do not think she will need casting or surgery, but recommend orthopedics referral for further monitoring.

## 2019-04-06 ENCOUNTER — Telehealth: Payer: Self-pay | Admitting: Family Medicine

## 2019-04-06 NOTE — Telephone Encounter (Signed)
I confirmed with our lab personnel that we are unable to print records or disks. I provided the number for medical records for the patient to call to retrieve records. No need to route note, for documentation purposes only.   Copied from [place] [id number]. Topic: General - Inquiry >> Apr 06, 2019 11:59 AM Reyne Dumas L wrote: Reason for CRM:  Pt calling states she was sent to [facility] for a broken wrist.  Pt states that [facility] has requested she bring xrays with her to her appointment and pt needs to know how to get them.

## 2019-05-12 ENCOUNTER — Ambulatory Visit: Payer: Medicare Other | Attending: Internal Medicine

## 2019-05-12 DIAGNOSIS — Z23 Encounter for immunization: Secondary | ICD-10-CM | POA: Insufficient documentation

## 2019-05-12 NOTE — Progress Notes (Signed)
   Covid-19 Vaccination Clinic  Name:  Jean Bowen    MRN: [id number] DOB: 1945-12-22  05/12/2019  [name] was observed post Covid-19 immunization for 15 minutes without incidence. She was provided with Vaccine Information Sheet and instruction to access the V-Safe system.   [name] was instructed to call 911 with any severe reactions post vaccine: Marland Kitchen Difficulty breathing  . Swelling of your face and throat  . A fast heartbeat  . A bad rash all over your body  . Dizziness and weakness    Immunizations Administered    Name Date Dose VIS Date Route   Pfizer COVID-19 Vaccine 05/12/2019  2:40 PM 0.3 mL 03/30/2019 Intramuscular   Manufacturer: [place]   Lot: [id number]   [place]: [id number]

## 2019-06-03 ENCOUNTER — Ambulatory Visit: Payer: Medicare Other | Attending: Internal Medicine

## 2019-06-03 DIAGNOSIS — Z23 Encounter for immunization: Secondary | ICD-10-CM | POA: Insufficient documentation

## 2019-06-03 NOTE — Progress Notes (Signed)
   Covid-19 Vaccination Clinic  Name:  [name]    MRN: [id number] DOB: 05-22-1945  06/03/2019  [name] was observed post Covid-19 immunization for 15 minutes without incidence. She was provided with Vaccine Information Sheet and instruction to access the V-Safe system.   [name] was instructed to call 911 with any severe reactions post vaccine: Marland Kitchen Difficulty breathing  . Swelling of your face and throat  . A fast heartbeat  . A bad rash all over your body  . Dizziness and weakness    Immunizations Administered    Name Date Dose VIS Date Route   Pfizer COVID-19 Vaccine 06/03/2019 10:22 AM 0.3 mL 03/30/2019 Intramuscular   Manufacturer: [place]   Lot: [id number]   [place]: [id number]

## 2019-06-11 ENCOUNTER — Ambulatory Visit (INDEPENDENT_AMBULATORY_CARE_PROVIDER_SITE_OTHER): Payer: Medicare Other

## 2019-06-11 ENCOUNTER — Encounter (HOSPITAL_COMMUNITY): Payer: Self-pay

## 2019-06-11 ENCOUNTER — Telehealth: Payer: Self-pay

## 2019-06-11 ENCOUNTER — Other Ambulatory Visit: Payer: Self-pay

## 2019-06-11 ENCOUNTER — Ambulatory Visit (HOSPITAL_COMMUNITY)
Admission: EM | Admit: 2019-06-11 | Discharge: 2019-06-11 | Disposition: A | Discharge status: Home / Self Care | Payer: Medicare Other | Attending: Urgent Care | Admitting: Urgent Care

## 2019-06-11 DIAGNOSIS — M79645 Pain in left finger(s): Secondary | ICD-10-CM

## 2019-06-11 DIAGNOSIS — S60042A Contusion of left ring finger without damage to nail, initial encounter: Secondary | ICD-10-CM | POA: Diagnosis not present

## 2019-06-11 DIAGNOSIS — W19XXXA Unspecified fall, initial encounter: Secondary | ICD-10-CM

## 2019-06-11 NOTE — ED Triage Notes (Signed)
Patient presents to Urgent Care with complaints of left ring finger pain since falling while walking the dogs. Patient reports her finger was bent at an awkward angle and she popped it back into place but there is significant residual swelling and bruising to the knuckles. Pt cannot get her ring off and would like it to be removed even if we have to cut it.

## 2019-06-11 NOTE — Discharge Instructions (Addendum)
You may take 500mg -650mg  Tylenol every 6 hours for pain and inflammation of your finger injury.

## 2019-06-11 NOTE — Telephone Encounter (Signed)
Patient slipped and fell patient think that her finger might be dislocated. Tried reaching out to the triage line unable to get patient triaged. Patient states that her finger is swelling someone will need to cut the ring off her finger. Offered patient the next available appt or local urgent care and she declined. Patient states she will be going to [place] in the box.

## 2019-06-11 NOTE — ED Provider Notes (Signed)
[place]   MRN: [id number] DOB: September 19, 1945  Subjective:   Jean Bowen is a 74 y.o. female presenting for suffering a fall ~2 hours today.  Had an accidental fall, slipped in mud and landed on an outstretched hand.  Patient states that her finger turned inwardly and popped it back.  She has since had significant swelling of her left fourth and third fingers with discoloration/bruising.  She has a ring on her fourth finger, is not able to take her ring off and like it removed.  Denies head injury, loss of consciousness, confusion, headache.  Has not taken anything for pain, does not need something right now.  Of note, patient did have a radial styloid fracture back in December.  No current facility-administered medications for this encounter.  Current Outpatient Medications:  .  acyclovir (ZOVIRAX) 400 MG tablet, TAKE TWO TABLETS BY MOUTH EVERY 4 HOURS WHILE AWAKE, Disp: 90 tablet, Rfl: 0 .  amLODipine (NORVASC) 2.5 MG tablet, Take 1 tablet (2.5 mg total) by mouth daily., Disp: 90 tablet, Rfl: 3 .  atorvastatin (LIPITOR) 10 MG tablet, Take 1 tablet (10 mg total) by mouth daily., Disp: 90 tablet, Rfl: 3 .  diclofenac (VOLTAREN) 75 MG EC tablet, Take 1 tablet (75 mg total) by mouth 2 (two) times daily., Disp: 30 tablet, Rfl: 0 .  escitalopram (LEXAPRO) 10 MG tablet, Take 1 tablet (10 mg total) by mouth daily., Disp: 90 tablet, Rfl: 3 .  hydrOXYzine (ATARAX/VISTARIL) 10 MG tablet, Take 1 tablet (10 mg total) by mouth 3 (three) times daily as needed for itching., Disp: 90 tablet, Rfl: 1 .  ibuprofen (ADVIL,MOTRIN) 100 MG/5ML suspension, Take 200 mg by mouth as needed., Disp: , Rfl:  .  levothyroxine (SYNTHROID) 25 MCG tablet, Take 1 tablet (25 mcg total) by mouth daily before breakfast., Disp: 90 tablet, Rfl: 1 .  omeprazole (PRILOSEC) 40 MG capsule, Take 1 capsule (40 mg total) by mouth daily., Disp: 30 capsule, Rfl: 11 .  oxybutynin (DITROPAN) 5 MG tablet, Take 1 tablet (5 mg total)  by mouth 3 (three) times daily., Disp: 270 tablet, Rfl: 3 .  valACYclovir (VALTREX) 1000 MG tablet, 2 pills at outbreak onset repeat in 12h for each outbreak, Disp: 30 tablet, Rfl: 0   Allergies  Allergen Reactions  . Doxycycline     REACTION: swollen lips and throat  . Losartan Potassium     REACTION: ? of angioedema  . Morphine And Related     Caused vomiting when given too fast in IV.    Past Medical History:  Diagnosis Date  . ANGIOEDEMA 08/22/09  . DEPRESSION 12/03/2006  . DERMATITIS, PERIORAL 01/08/2010  . Diverticulosis   . GERD 03/03/2007  . Hiatal hernia   . History of benign gastric tumor    significant for a benign gastric tumor that weighed approximately 5 pounds and was resected.  Marland Kitchen HYPERCHOLESTEROLEMIA 02/23/2010  . Hyperplastic colon polyp 12/03/2006  . HYPERTENSION 12/03/2006  . OSA (obstructive sleep apnea)   . OSTEOPOROSIS 12/03/2006  . Sleep apnea   . URINARY INCONTINENCE 12/03/2006     Past Surgical History:  Procedure Laterality Date  . BUNIONECTOMY     both feet  . CATARACT EXTRACTION     Bil  . CHOLECYSTECTOMY    . ELECTROCARDIOGRAM  02/07/2006  . ESOPHAGOGASTRODUODENOSCOPY  11/17/2006  . Hx of nasal septal reconstruction for deviated septum  1980's  . KNEE ARTHROSCOPY     Bilateral-torn meniscus  . 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 Stress Cardiolite  02/14/2006  . TUMOR REMOVAL  2003   removal 5 lb benign tumor from abdomen    Family History  Problem Relation Age of Onset  . Melanoma Mother   . Lung cancer Mother   . Colon cancer Father        at advanced age  . Arthritis Father   . Lung cancer Brother   . Heart attack Other        grandfather  . Diabetes Other        grandfather/grandmother    Social History   Tobacco Use  . Smoking status: Former Smoker    Packs/day: 2.00    Years: 30.00    Pack years: 60.00    Types: Cigarettes    Quit date: 05/22/2001    Years since quitting: 18.0  .  Smokeless tobacco: Never Used  Substance Use Topics  . Alcohol use: Yes    Alcohol/week: 3.0 standard drinks    Types: 3 Glasses of wine per week    Comment: 1-2/week  . Drug use: No    ROS   Objective:   Vitals: BP (!) 156/94 (BP Location: Right Arm)   Pulse 64   Temp 98.2 F (36.8 C) (Oral)   Resp 16   SpO2 100%   Physical Exam Constitutional:      General: She is not in acute distress.    Appearance: Normal appearance. She is well-developed. She is not ill-appearing, toxic-appearing or diaphoretic.  HENT:     Head: Normocephalic and atraumatic.     Nose: Nose normal.     Mouth/Throat:     Mouth: Mucous membranes are moist.     Pharynx: Oropharynx is clear.  Eyes:     General: No scleral icterus.    Extraocular Movements: Extraocular movements intact.     Pupils: Pupils are equal, round, and reactive to light.  Cardiovascular:     Rate and Rhythm: Normal rate.  Pulmonary:     Effort: Pulmonary effort is normal.  Musculoskeletal:     Left hand: Swelling (3rd, 4th fingers with ecchymosis of 4th finger), tenderness and bony tenderness (of 3rd, 4th fingers) present. No deformity or lacerations. Decreased range of motion (full flexion of 3rd, 4th fingers). Decreased strength (secondary to pain). Normal sensation. Normal capillary refill.  Skin:    General: Skin is warm and dry.  Neurological:     General: No focal deficit present.     Mental Status: She is alert and oriented to person, place, and time.     Cranial Nerves: No cranial nerve deficit.     Motor: No weakness.     Coordination: Coordination normal.     Gait: Gait normal.  Psychiatric:        Mood and Affect: Mood normal.        Behavior: Behavior normal.        Thought Content: Thought content normal.        Judgment: Judgment normal.    Ring removed using ring cutter of fourth finger.  Patient tolerated this well and ring was removed successfully without incident.  DG Hand Complete Left  Result  Date: 06/11/2019 CLINICAL DATA:  Left hand pain, swelling, fall EXAM: LEFT HAND - COMPLETE 3+ VIEW COMPARISON:  None. FINDINGS: Age indeterminate lucency/possible fracture at the radial styloid. No subluxation. No other discrete lucencies are visualized. Joint space narrowing at the IP joints. IMPRESSION: 1. No definite acute  osseous abnormality at the fourth digit 2. Possible age indeterminate fracture involving the radial styloid, correlate for point tenderness. Electronically Signed   By: Donavan Foil M.D.   On: 06/11/2019 18:15    Assessment and Plan :   1. Contusion of left ring finger without damage to nail, initial encounter   2. Fall, initial encounter   3. Pain of left middle finger   4. Pain in finger of left hand     Counseled on conservative management of finger contusion and injury.  There is not an acute fracture seen on x-ray.  Patient is to use buddy tape system.  Schedule Tylenol for pain. Counseled patient on potential for adverse effects with medications prescribed/recommended today, ER and return-to-clinic precautions discussed, patient verbalized understanding.    Jaynee Eagles, [place] 06/11/19 [id number]

## 2019-06-12 NOTE — Telephone Encounter (Signed)
Left voice message for patient to call clinic.  

## 2019-06-12 NOTE — Telephone Encounter (Signed)
Noted. Patient needs to be seen ASAP.  Algis Greenhouse. Jerline Pain, MD 06/12/2019 2:28 PM

## 2019-06-13 NOTE — Telephone Encounter (Signed)
Patient went to urgent care the same day of injury,she will follow up if needed

## 2019-06-25 ENCOUNTER — Other Ambulatory Visit: Payer: Self-pay

## 2019-06-25 ENCOUNTER — Emergency Department (HOSPITAL_COMMUNITY): Payer: Medicare Other

## 2019-06-25 ENCOUNTER — Encounter (HOSPITAL_COMMUNITY): Payer: Self-pay

## 2019-06-25 ENCOUNTER — Inpatient Hospital Stay (HOSPITAL_COMMUNITY)
Admission: EM | Admit: 2019-06-25 | Discharge: 2019-06-28 | DRG: 551 | Disposition: A | Discharge status: Skilled Nursing Facility | Payer: Medicare Other | Attending: Internal Medicine | Admitting: Internal Medicine

## 2019-06-25 DIAGNOSIS — Z20822 Contact with and (suspected) exposure to covid-19: Secondary | ICD-10-CM | POA: Diagnosis present

## 2019-06-25 DIAGNOSIS — Z79890 Hormone replacement therapy (postmenopausal): Secondary | ICD-10-CM

## 2019-06-25 DIAGNOSIS — W108XXA Fall (on) (from) other stairs and steps, initial encounter: Secondary | ICD-10-CM | POA: Diagnosis present

## 2019-06-25 DIAGNOSIS — Z66 Do not resuscitate: Secondary | ICD-10-CM | POA: Diagnosis present

## 2019-06-25 DIAGNOSIS — K219 Gastro-esophageal reflux disease without esophagitis: Secondary | ICD-10-CM | POA: Diagnosis not present

## 2019-06-25 DIAGNOSIS — Z8261 Family history of arthritis: Secondary | ICD-10-CM

## 2019-06-25 DIAGNOSIS — S32492A Other specified fracture of left acetabulum, initial encounter for closed fracture: Secondary | ICD-10-CM | POA: Diagnosis not present

## 2019-06-25 DIAGNOSIS — E7800 Pure hypercholesterolemia, unspecified: Secondary | ICD-10-CM | POA: Diagnosis present

## 2019-06-25 DIAGNOSIS — Z801 Family history of malignant neoplasm of trachea, bronchus and lung: Secondary | ICD-10-CM

## 2019-06-25 DIAGNOSIS — S329XXA Fracture of unspecified parts of lumbosacral spine and pelvis, initial encounter for closed fracture: Secondary | ICD-10-CM | POA: Diagnosis present

## 2019-06-25 DIAGNOSIS — Z808 Family history of malignant neoplasm of other organs or systems: Secondary | ICD-10-CM

## 2019-06-25 DIAGNOSIS — D649 Anemia, unspecified: Secondary | ICD-10-CM | POA: Diagnosis present

## 2019-06-25 DIAGNOSIS — F329 Major depressive disorder, single episode, unspecified: Secondary | ICD-10-CM | POA: Diagnosis present

## 2019-06-25 DIAGNOSIS — E538 Deficiency of other specified B group vitamins: Secondary | ICD-10-CM | POA: Diagnosis present

## 2019-06-25 DIAGNOSIS — Z85820 Personal history of malignant melanoma of skin: Secondary | ICD-10-CM

## 2019-06-25 DIAGNOSIS — Z87891 Personal history of nicotine dependence: Secondary | ICD-10-CM

## 2019-06-25 DIAGNOSIS — Z79899 Other long term (current) drug therapy: Secondary | ICD-10-CM

## 2019-06-25 DIAGNOSIS — S3289XA Fracture of other parts of pelvis, initial encounter for closed fracture: Secondary | ICD-10-CM

## 2019-06-25 DIAGNOSIS — I10 Essential (primary) hypertension: Secondary | ICD-10-CM | POA: Diagnosis present

## 2019-06-25 DIAGNOSIS — E876 Hypokalemia: Secondary | ICD-10-CM | POA: Diagnosis present

## 2019-06-25 DIAGNOSIS — E039 Hypothyroidism, unspecified: Secondary | ICD-10-CM | POA: Diagnosis not present

## 2019-06-25 DIAGNOSIS — S32810A Multiple fractures of pelvis with stable disruption of pelvic ring, initial encounter for closed fracture: Secondary | ICD-10-CM

## 2019-06-25 DIAGNOSIS — S3282XA Multiple fractures of pelvis without disruption of pelvic ring, initial encounter for closed fracture: Secondary | ICD-10-CM | POA: Diagnosis present

## 2019-06-25 DIAGNOSIS — S32110A Nondisplaced Zone I fracture of sacrum, initial encounter for closed fracture: Principal | ICD-10-CM | POA: Diagnosis present

## 2019-06-25 DIAGNOSIS — S32402A Unspecified fracture of left acetabulum, initial encounter for closed fracture: Secondary | ICD-10-CM

## 2019-06-25 DIAGNOSIS — Z833 Family history of diabetes mellitus: Secondary | ICD-10-CM

## 2019-06-25 DIAGNOSIS — I5032 Chronic diastolic (congestive) heart failure: Secondary | ICD-10-CM | POA: Diagnosis present

## 2019-06-25 DIAGNOSIS — I110 Hypertensive heart disease with heart failure: Secondary | ICD-10-CM | POA: Diagnosis present

## 2019-06-25 DIAGNOSIS — Z800 Family history of malignant neoplasm of digestive organs: Secondary | ICD-10-CM

## 2019-06-25 DIAGNOSIS — Y92008 Other place in unspecified non-institutional (private) residence as the place of occurrence of the external cause: Secondary | ICD-10-CM

## 2019-06-25 DIAGNOSIS — Z8249 Family history of ischemic heart disease and other diseases of the circulatory system: Secondary | ICD-10-CM

## 2019-06-25 DIAGNOSIS — N3281 Overactive bladder: Secondary | ICD-10-CM | POA: Diagnosis present

## 2019-06-25 DIAGNOSIS — S32592A Other specified fracture of left pubis, initial encounter for closed fracture: Secondary | ICD-10-CM | POA: Diagnosis present

## 2019-06-25 DIAGNOSIS — R296 Repeated falls: Secondary | ICD-10-CM | POA: Diagnosis present

## 2019-06-25 DIAGNOSIS — Z8673 Personal history of transient ischemic attack (TIA), and cerebral infarction without residual deficits: Secondary | ICD-10-CM

## 2019-06-25 LAB — BASIC METABOLIC PANEL
Anion gap: 8 (ref 5–15)
BUN: 13 mg/dL (ref 8–23)
CO2: 28 mmol/L (ref 22–32)
Calcium: 8.5 mg/dL — ABNORMAL LOW (ref 8.9–10.3)
Chloride: 103 mmol/L (ref 98–111)
Creatinine, Ser: 0.63 mg/dL (ref 0.44–1.00)
GFR calc Af Amer: >60 mL/min (ref >60)
GFR calc non Af Amer: >60 mL/min (ref >60)
Glucose, Bld: 94 mg/dL (ref 70–99)
Potassium: 4.6 mmol/L (ref 3.5–5.1)
Sodium: 139 mmol/L (ref 135–145)

## 2019-06-25 LAB — CBC WITH DIFFERENTIAL/PLATELET
Abs Immature Granulocytes: 0.05 10*3/uL (ref 0.00–0.07)
Basophils Absolute: 0 10*3/uL (ref 0.0–0.1)
Basophils Relative: 0 %
Eosinophils Absolute: 0 10*3/uL (ref 0.0–0.5)
Eosinophils Relative: 1 %
HCT: 32 % — ABNORMAL LOW (ref 36.0–46.0)
Hemoglobin: 10.1 g/dL — ABNORMAL LOW (ref 12.0–15.0)
Immature Granulocytes: 1 %
Lymphocytes Relative: 11 %
Lymphs Abs: 0.9 10*3/uL (ref 0.7–4.0)
MCH: 30.8 pg (ref 26.0–34.0)
MCHC: 31.6 g/dL (ref 30.0–36.0)
MCV: 97.6 fL (ref 80.0–100.0)
Monocytes Absolute: 0.9 10*3/uL (ref 0.1–1.0)
Monocytes Relative: 11 %
Neutro Abs: 6.1 10*3/uL (ref 1.7–7.7)
Neutrophils Relative %: 76 %
Platelets: 138 10*3/uL — ABNORMAL LOW (ref 150–400)
RBC: 3.28 MIL/uL — ABNORMAL LOW (ref 3.87–5.11)
RDW: 13.3 % (ref 11.5–15.5)
WBC: 8 10*3/uL (ref 4.0–10.5)
nRBC: 0 % (ref 0.0–0.2)

## 2019-06-25 MED ORDER — METHOCARBAMOL 1000 MG/10ML IJ SOLN
500.0000 mg | Freq: Four times a day (QID) | INTRAVENOUS | Status: DC | PRN
  Filled 2019-06-25: qty 5

## 2019-06-25 MED ORDER — ESCITALOPRAM OXALATE 10 MG PO TABS
10.0000 mg | ORAL_TABLET | Freq: Every day | ORAL | Status: DC
  Administered 2019-06-25 – 2019-06-28 (×4): 10 mg via ORAL
  Filled 2019-06-25 (×4): qty 1

## 2019-06-25 MED ORDER — ACETAMINOPHEN 500 MG PO TABS
1000.0000 mg | ORAL_TABLET | Freq: Once | ORAL | Status: AC
  Administered 2019-06-25: 1000 mg via ORAL
  Filled 2019-06-25: qty 2

## 2019-06-25 MED ORDER — HYDROCODONE-ACETAMINOPHEN 5-325 MG PO TABS
1.0000 | ORAL_TABLET | Freq: Four times a day (QID) | ORAL | Status: DC | PRN
  Administered 2019-06-26 – 2019-06-27 (×2): 1 via ORAL
  Filled 2019-06-25 (×2): qty 1

## 2019-06-25 MED ORDER — OXYBUTYNIN CHLORIDE 5 MG PO TABS
5.0000 mg | ORAL_TABLET | Freq: Every day | ORAL | Status: DC
  Administered 2019-06-25 – 2019-06-28 (×4): 5 mg via ORAL
  Filled 2019-06-25 (×4): qty 1

## 2019-06-25 MED ORDER — ACYCLOVIR 400 MG PO TABS
800.0000 mg | ORAL_TABLET | Freq: Every day | ORAL | Status: DC
  Administered 2019-06-25 – 2019-06-28 (×4): 800 mg via ORAL
  Filled 2019-06-25 (×4): qty 2

## 2019-06-25 MED ORDER — SODIUM CHLORIDE 0.9 % IV SOLN: INTRAVENOUS | Status: DC

## 2019-06-25 MED ORDER — FENTANYL CITRATE (PF) 100 MCG/2ML IJ SOLN: 50.0000 ug | Freq: Once | INTRAMUSCULAR | Status: DC

## 2019-06-25 MED ORDER — OXYCODONE HCL 5 MG PO TABS
5.0000 mg | ORAL_TABLET | Freq: Once | ORAL | Status: AC
  Administered 2019-06-25: 5 mg via ORAL
  Filled 2019-06-25: qty 1

## 2019-06-25 MED ORDER — AMLODIPINE BESYLATE 5 MG PO TABS
2.5000 mg | ORAL_TABLET | Freq: Every day | ORAL | Status: DC
  Administered 2019-06-25 – 2019-06-28 (×4): 2.5 mg via ORAL
  Filled 2019-06-25 (×4): qty 1

## 2019-06-25 MED ORDER — HYDROMORPHONE HCL 1 MG/ML IJ SOLN: 0.5000 mg | INTRAMUSCULAR | Status: DC | PRN

## 2019-06-25 MED ORDER — LEVOTHYROXINE SODIUM 25 MCG PO TABS
25.0000 ug | ORAL_TABLET | Freq: Every day | ORAL | Status: DC
  Administered 2019-06-26 – 2019-06-28 (×3): 25 ug via ORAL
  Filled 2019-06-25 (×3): qty 1

## 2019-06-25 MED ORDER — HYDROXYZINE HCL 10 MG PO TABS
10.0000 mg | ORAL_TABLET | Freq: Three times a day (TID) | ORAL | Status: DC | PRN
  Filled 2019-06-25: qty 1

## 2019-06-25 MED ORDER — METHOCARBAMOL 500 MG PO TABS
500.0000 mg | ORAL_TABLET | Freq: Four times a day (QID) | ORAL | Status: DC | PRN
  Administered 2019-06-25 – 2019-06-28 (×5): 500 mg via ORAL
  Filled 2019-06-25 (×5): qty 1

## 2019-06-25 NOTE — ED Provider Notes (Addendum)
Patient had a fall on her porch.  She landed on her left hip.  She reports she tried to wait and see if it would get better but it did not and she was not able to walk because it was so severely painful.  She denies she had any other significant injury. Physical Exam  BP (!) 144/88   Pulse 72   Temp 98.1 F (36.7 C) (Oral)   Resp 16   SpO2 91%   Physical Exam Patient is alert and appropriate.  Interactive.  No respiratory distress. ED Course/Procedures   Clinical Course as of Jun 25 1903  Mon Jun 25, 2019  1904 Consult: Reviewed Dr. Roel Cluck for admission.   [MP]    Clinical Course User Index [MP] Charlesetta Shanks, MD    Procedures  MDM  Consult: Reviewed with Dr. Doran Durand.  He will see the patient in the emergency department for consultation.  This will be nonoperative management.  Toe-touch weightbearing. will place consultation for hospitalist for admission.  Patient is alert and otherwise in good condition without any evidence of other traumatic injury.       Charlesetta Shanks, MD 06/25/19 Daneil Dolin    Charlesetta Shanks, MD 06/25/19 Drema Halon

## 2019-06-25 NOTE — Consult Note (Signed)
Reason for Consult: Left hip pain Referring Physician: Dr. Cecille Po Jean Bowen is an 74 y.o. female.  HPI: The patient is a 74 year old female without significant past medical history.  She complains of left hip pain since a fall earlier today.  She fell while trying to get something off of her porch.  She landed right on the left side of her hip.  She presented to the emergency room.  I am consulted for further evaluation and management of her left hip pain.  She complains of aching pain in the left hip that is mild when at rest.  When she tries to move it becomes a bit more sore and painful.  She denies any history of injury or surgery to that hip in the past.  She is not diabetic.  No history of smoking.  She lives alone.  Past Medical History:  Diagnosis Date  . ANGIOEDEMA 08/22/09  . DEPRESSION 12/03/2006  . DERMATITIS, PERIORAL 01/08/2010  . Diverticulosis   . GERD 03/03/2007  . Hiatal hernia   . History of benign gastric tumor    significant for a benign gastric tumor that weighed approximately 5 pounds and was resected.  Marland Kitchen HYPERCHOLESTEROLEMIA 02/23/2010  . Hyperplastic colon polyp 12/03/2006  . HYPERTENSION 12/03/2006  . OSA (obstructive sleep apnea)   . OSTEOPOROSIS 12/03/2006  . Sleep apnea   . URINARY INCONTINENCE 12/03/2006    Past Surgical History:  Procedure Laterality Date  . BUNIONECTOMY     both feet  . CATARACT EXTRACTION     Bil  . CHOLECYSTECTOMY    . ELECTROCARDIOGRAM  02/07/2006  . ESOPHAGOGASTRODUODENOSCOPY  11/17/2006  . Hx of nasal septal reconstruction for deviated septum  1980's  . KNEE ARTHROSCOPY     Bilateral-torn meniscus  . 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 Stress Cardiolite  02/14/2006  . TUMOR REMOVAL  2003   removal 5 lb benign tumor from abdomen    Family History  Problem Relation Age of Onset  . Melanoma Mother   . Lung cancer Mother   . Colon cancer Father        at advanced age  . Arthritis Father    . Lung cancer Brother   . Heart attack Other        grandfather  . Diabetes Other        grandfather/grandmother    Social History:  reports that she quit smoking about 18 years ago. Her smoking use included cigarettes. She has a 60.00 pack-year smoking history. She has never used smokeless tobacco. She reports current alcohol use of about 3.0 standard drinks of alcohol per week. She reports that she does not use drugs.  Allergies:  Allergies  Allergen Reactions  . Doxycycline     REACTION: swollen lips and throat  . Losartan Potassium     REACTION: ? of angioedema  . Morphine And Related     Caused vomiting when given too fast in IV.    Medications: I have reviewed the patient's current medications.  Results for orders placed or performed during the hospital encounter of 06/25/19 (from the past 48 hour(s))  CBC with Differential     Status: Abnormal   Collection Time: 06/25/19  2:52 PM  Result Value Ref Range   WBC 8.0 4.0 - 10.5 K/uL   RBC 3.28 (L) 3.87 - 5.11 MIL/uL   Hemoglobin 10.1 (L) 12.0 - 15.0 g/dL   HCT 32.0 (L)  36.0 - 46.0 %   MCV 97.6 80.0 - 100.0 fL   MCH 30.8 26.0 - 34.0 pg   MCHC 31.6 30.0 - 36.0 g/dL   RDW 13.3 11.5 - 15.5 %   Platelets 138 (L) 150 - 400 K/uL   nRBC 0.0 0.0 - 0.2 %   Neutrophils Relative % 76 %   Neutro Abs 6.1 1.7 - 7.7 K/uL   Lymphocytes Relative 11 %   Lymphs Abs 0.9 0.7 - 4.0 K/uL   Monocytes Relative 11 %   Monocytes Absolute 0.9 0.1 - 1.0 K/uL   Eosinophils Relative 1 %   Eosinophils Absolute 0.0 0.0 - 0.5 K/uL   Basophils Relative 0 %   Basophils Absolute 0.0 0.0 - 0.1 K/uL   Immature Granulocytes 1 %   Abs Immature Granulocytes 0.05 0.00 - 0.07 K/uL    Comment: Performed at [facility], [place] [address], [place], [place] [id number]  Basic metabolic panel     Status: Abnormal   Collection Time: 06/25/19  2:52 PM  Result Value Ref Range   Sodium 139 135 - 145 mmol/L   Potassium 4.6 3.5 - 5.1 mmol/L    Chloride 103 98 - 111 mmol/L   CO2 28 22 - 32 mmol/L   Glucose, Bld 94 70 - 99 mg/dL    Comment: Glucose reference range applies only to samples taken after fasting for at least 8 hours.   BUN 13 8 - 23 mg/dL   Creatinine, Ser 0.63 0.44 - 1.00 mg/dL   Calcium 8.5 (L) 8.9 - 10.3 mg/dL   GFR calc non Af Amer >60 >60 mL/min   GFR calc Af Amer >60 >60 mL/min   Anion gap 8 5 - 15    Comment: Performed at [facility], [place] [address], [place], [place] [address]    CT PELVIS WO CONTRAST  Result Date: 06/25/2019 CLINICAL DATA:  Left hip pain after a fall EXAM: CT PELVIS WITHOUT CONTRAST TECHNIQUE: Multidetector CT imaging of the pelvis was performed following the standard protocol without intravenous contrast. COMPARISON:  Radiographs from 06/25/2019 FINDINGS: Urinary Tract:  Urinary bladder unremarkable. Bowel:  Sigmoid colon diverticulosis. Vascular/Lymphatic: Aortoiliac atherosclerotic vascular disease. Reproductive:  Unremarkable Other:  No supplemental non-categorized findings. Musculoskeletal: Nondisplaced acute fracture of the left anterior acetabular wall. Nondisplaced acute fracture of the left inferior pubic ramus. Subtle nondisplaced fracture the left sacral ala. Questionable transverse component of S3-4. Degenerative facet arthropathy at L5-S1 on the right. No fracture of the left proximal femur is identified. Mild spurring of the right femoral head. IMPRESSION: 1. Nondisplaced acute fractures of the left anterior acetabular wall, left inferior pubic ramus, and left sacral ala. 2. Sigmoid colon diverticulosis. 3. Aortoiliac atherosclerotic vascular disease. 4. Degenerative facet arthropathy at L5-S1 on the right. Aortic Atherosclerosis (ICD10-I70.0). Electronically Signed   By: Van Clines M.D.   On: 06/25/2019 17:07   DG Hip Unilat W or Wo Pelvis 2-3 Views Left  Result Date: 06/25/2019 CLINICAL DATA:  Left hip pain after a fall this morning. Initial encounter. EXAM: DG  HIP (WITH OR WITHOUT PELVIS) 2-3V LEFT COMPARISON:  None. FINDINGS: There is no evidence of hip fracture or dislocation. There is no evidence of arthropathy or other focal bone abnormality. IMPRESSION: Negative exam. Electronically Signed   By: Inge Rise M.D.   On: 06/25/2019 13:51    ROS: No recent fever, chills, nausea, vomiting or changes in her appetite PE:  Blood pressure (!) 164/94, pulse 70,  temperature 98.1 F (36.7 C), temperature source Oral, resp. rate 10, SpO2 92 %. Well-nourished well-developed woman in no apparent distress.  Alert and oriented x4.  Mood and affect are normal.  Extraocular motions are intact.  Respirations are unlabored.  Left lower extremity has no gross deformity.  5 out of 5 strength with hip flexion, ankle dorsiflexion and ankle plantarflexion.  Palpable pulses in the foot.  Normal sensibility to light touch in the foot.  No lymphadenopathy in the left lower extremity.  No significant pain with logroll.  Assessment/Plan: Left nondisplaced pubic ramus, nondisplaced anterior wall acetabular and nondisplaced sacral ala fractures -I explained the nature of these injuries to the patient in detail.  I believe it is safe for her to bear weight as tolerated on her left lower extremity with a walker.  I will write physical therapy orders for her.  She will try pain control with oral anti-inflammatories and Tylenol with opioids for breakthrough pain.  I would anticipate that she should be able to go home in a day or 2 if she does well with physical therapy.  She understands this plan and agrees.  Wylene Simmer 06/25/2019, 8:09 PM

## 2019-06-25 NOTE — ED Triage Notes (Signed)
EMS reports from home, unwitnessed fall, lives alone, denies LOC, did not strike head, no obvious or other injuries. Pt c/o left hip pain.  BP 158/92 HR 84 Sp02 96 RA RR 20 Temp 97.6  22 R Hand 25mcg Fentanyl enroute

## 2019-06-25 NOTE — ED Notes (Signed)
Pt given [place] sandwich and cola

## 2019-06-25 NOTE — H&P (Signed)
Jean Bowen [id number] DOB: [birth year] DOA: 06/25/2019     PCP: Vivi Barrack, MD   Outpatient Specialists:  NONE    Patient arrived to ER on 06/25/19 at 1255  Patient coming from: home Lives alone,      Chief Complaint:   Chief Complaint  Patient presents with  . Fall  . Hip Pain    HPI: Jean Bowen is a 74 y.o. female with medical history significant of depression frequent falls, HTN, HLD, GERD, OSA chronic diastolic CHF grade 1    Presented with unwitnessed fall comes from home lives by herself denies any LOC did not strike her head.  EMS was called initial blood pressure 158/92 satting 96% on room air afebrile.  Given 50 mcg of fentanyl secondary to severe pain and inability to ambulate Patient apparently was on the stairs and fell down landing on her left side could only walk with severe pain.  Denies hitting her head  No associated chest pain no fevers no chills no shortness of breath Infectious risk factors:  Reports none      in house  PCR testing  Pending  No results found for: SARSCOV2NAA   Regarding pertinent Chronic problems:       HTN on NOrvasc   chronic CHF diastolic  - last echo [id number] preserved EF grade 1 diastolic CHF    Hypothyroidism:  Lab Results  Component Value Date   TSH 2.81 03/13/2019   on synthroid      While in ER: Plain imaging initially was worrisome for pubic rami fracture CT pelvis was ordered showing acute fractures left anterior acetabular wall left inferior pubic ramus and left sacral ala  ER Provider Called: Orthopedics    Dr. Doran Durand They Recommend admit to medicine none surgical intervention.  Toe weightbearing Will see   in ER Will need admission for pain management and probably placement  The following Work up has been ordered so far:  Orders Placed This Encounter  Procedures  . SARS CORONAVIRUS 2 (TAT 6-24 HRS) Nasopharyngeal Nasopharyngeal Swab  . DG Hip Unilat W or Wo Pelvis 2-3 Views Left  . CT PELVIS  WO CONTRAST  . CBC with Differential  . Basic metabolic panel  . Ambulate in room  . Nursing communication  . Consult to orthopedic surgery  ALL PATIENTS BEING ADMITTED/HAVING PROCEDURES NEED COVID-19 SCREENING  . Consult to hospitalist  ALL PATIENTS BEING ADMITTED/HAVING PROCEDURES NEED COVID-19 SCREENING    Following Medications were ordered in ER: Medications  fentaNYL (SUBLIMAZE) injection 50 mcg (0 mcg Intravenous Hold 06/25/19 1915)  acetaminophen (TYLENOL) tablet 1,000 mg (1,000 mg Oral Given 06/25/19 1414)  oxyCODONE (Oxy IR/ROXICODONE) immediate release tablet 5 mg (5 mg Oral Given 06/25/19 1414)        Consult Orders  (From admission, onward)         Start     Ordered   06/25/19 1841  Consult to hospitalist  ALL PATIENTS BEING ADMITTED/HAVING PROCEDURES NEED COVID-19 SCREENING  Once    Comments: ALL PATIENTS BEING ADMITTED/HAVING PROCEDURES NEED COVID-19 SCREENING  Provider:  (Not yet assigned)  Question Answer Comment  Place call to: [facility]   Reason for Consult Admit      06/25/19 1840          Significant initial  Findings: Abnormal Labs Reviewed  CBC WITH DIFFERENTIAL/PLATELET - Abnormal; Notable for the following components:      Result Value   RBC 3.28 (*)    Hemoglobin  10.1 (*)    HCT 32.0 (*)    Platelets 138 (*)    All other components within normal limits  BASIC METABOLIC PANEL - Abnormal; Notable for the following components:   Calcium 8.5 (*)    All other components within normal limits   Otherwise labs showing:    Recent Labs  Lab 06/25/19 1452  NA 139  K 4.6  CO2 28  GLUCOSE 94  BUN 13  CREATININE 0.63  CALCIUM 8.5*    Cr   stable,   Lab Results  Component Value Date   CREATININE 0.63 06/25/2019   CREATININE 0.65 03/13/2019   CREATININE 0.78 02/16/2018    No results for input(s): AST, ALT, ALKPHOS, BILITOT, PROT, ALBUMIN in the last 168 hours. Lab Results  Component Value Date   CALCIUM 8.5 (L) 06/25/2019   PHOS 3.5  03/20/2015      WBC      Component Value Date/Time   WBC 8.0 06/25/2019 1452   ANC    Component Value Date/Time   NEUTROABS 6.1 06/25/2019 1452   ALC No components found for: LYMPHAB    Plt: Lab Results  Component Value Date   PLT 138 (L) 06/25/2019       HG/HCT  stable,       Component Value Date/Time   HGB 10.1 (L) 06/25/2019 1452   HCT 32.0 (L) 06/25/2019 1452    No results for input(s): LIPASE, AMYLASE in the last 168 hours. No results for input(s): AMMONIA in the last 168 hours.  No components found for: LABALBU  ECG:  Not Ordered    UA  not ordered    Ordered     CT pelvis - multiple pelvic frx    ED Triage Vitals  Enc Vitals Group     BP 06/25/19 1308 (!) 158/86     Pulse Rate 06/25/19 1308 82     Resp 06/25/19 1308 16     Temp 06/25/19 1308 98.1 F (36.7 C)     Temp Source 06/25/19 1308 Oral     SpO2 06/25/19 1308 96 %     Weight --      Height --      Head Circumference --      Peak Flow --      Pain Score 06/25/19 1309 1     Pain Loc --      Pain Edu? --      Excl. in [place]? --   TMAX(24)@       Latest  Blood pressure (!) 164/94, pulse 70, temperature 98.1 F (36.7 C), temperature source Oral, resp. rate 10, SpO2 92 %.     Hospitalist was called for admission for pelvic fractures   Review of Systems:    Pertinent positives include: fall, pain with ambulation  Constitutional:  No weight loss, night sweats, Fevers, chills, fatigue, weight loss  HEENT:  No headaches, Difficulty swallowing,Tooth/dental problems,Sore throat,  No sneezing, itching, ear ache, nasal congestion, post nasal drip,  Cardio-vascular:  No chest pain, Orthopnea, PND, anasarca, dizziness, palpitations.no Bilateral lower extremity swelling  GI:  No heartburn, indigestion, abdominal pain, nausea, vomiting, diarrhea, change in bowel habits, loss of appetite, melena, blood in stool, hematemesis Resp:  no shortness of breath at rest. No dyspnea on exertion, No  excess mucus, no productive cough, No non-productive cough, No coughing up of blood.No change in color of mucus.No wheezing. Skin:  no rash or lesions. No jaundice GU:  no dysuria, change in  color of urine, no urgency or frequency. No straining to urinate.  No flank pain.  Musculoskeletal:  No joint pain or no joint swelling. No decreased range of motion. No back pain.  Psych:  No change in mood or affect. No depression or anxiety. No memory loss.  Neuro: no localizing neurological complaints, no tingling, no weakness, no double vision, no gait abnormality, no slurred speech, no confusion  All systems reviewed and apart from [place] all are negative  Past Medical History:   Past Medical History:  Diagnosis Date  . ANGIOEDEMA 08/22/09  . DEPRESSION 12/03/2006  . DERMATITIS, PERIORAL 01/08/2010  . Diverticulosis   . GERD 03/03/2007  . Hiatal hernia   . History of benign gastric tumor    significant for a benign gastric tumor that weighed approximately 5 pounds and was resected.  Marland Kitchen HYPERCHOLESTEROLEMIA 02/23/2010  . Hyperplastic colon polyp 12/03/2006  . HYPERTENSION 12/03/2006  . OSA (obstructive sleep apnea)   . OSTEOPOROSIS 12/03/2006  . Sleep apnea   . URINARY INCONTINENCE 12/03/2006     Past Surgical History:  Procedure Laterality Date  . BUNIONECTOMY     both feet  . CATARACT EXTRACTION     Bil  . CHOLECYSTECTOMY    . ELECTROCARDIOGRAM  02/07/2006  . ESOPHAGOGASTRODUODENOSCOPY  11/17/2006  . Hx of nasal septal reconstruction for deviated septum  1980's  . KNEE ARTHROSCOPY     Bilateral-torn meniscus  . 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 Stress Cardiolite  02/14/2006  . TUMOR REMOVAL  2003   removal 5 lb benign tumor from abdomen    Social History:  Ambulatory   independently       reports that she quit smoking about 18 years ago. Her smoking use included cigarettes. She has a 60.00 pack-year smoking history. She has never used  smokeless tobacco. She reports current alcohol use of about 3.0 standard drinks of alcohol per week. She reports that she does not use drugs.     Family History:   Family History  Problem Relation Age of Onset  . Melanoma Mother   . Lung cancer Mother   . Colon cancer Father        at advanced age  . Arthritis Father   . Lung cancer Brother   . Heart attack Other        grandfather  . Diabetes Other        grandfather/grandmother    Allergies: Allergies  Allergen Reactions  . Doxycycline     REACTION: swollen lips and throat  . Losartan Potassium     REACTION: ? of angioedema  . Morphine And Related     Caused vomiting when given too fast in IV.     Prior to Admission medications   Medication Sig Start Date End Date Taking? Authorizing Provider  acyclovir (ZOVIRAX) 400 MG tablet TAKE TWO TABLETS BY MOUTH EVERY 4 HOURS WHILE AWAKE Patient taking differently: Take 800 mg by mouth daily.  03/23/19  Yes Vivi Barrack, MD  amLODipine (NORVASC) 2.5 MG tablet Take 1 tablet (2.5 mg total) by mouth daily. 03/13/19  Yes Vivi Barrack, MD  escitalopram (LEXAPRO) 10 MG tablet Take 1 tablet (10 mg total) by mouth daily. 03/13/19  Yes Vivi Barrack, MD  hydrOXYzine (ATARAX/VISTARIL) 10 MG tablet Take 1 tablet (10 mg total) by mouth 3 (three) times daily as needed for itching. 03/13/19  Yes Vivi Barrack, MD  ibuprofen (ADVIL,MOTRIN) 100 MG/5ML suspension Take 200 mg by mouth as needed for fever or mild pain.    Yes [provider]  levothyroxine (SYNTHROID) 25 MCG tablet Take 1 tablet (25 mcg total) by mouth daily before breakfast. 03/13/19  Yes Vivi Barrack, MD  omeprazole (PRILOSEC) 40 MG capsule Take 1 capsule (40 mg total) by mouth daily. 10/16/14  Yes Lafayette Dragon, MD  oxybutynin (DITROPAN) 5 MG tablet Take 1 tablet (5 mg total) by mouth 3 (three) times daily. Patient taking differently: Take 5 mg by mouth daily.  03/13/19  Yes Vivi Barrack, MD  valACYclovir  (VALTREX) 1000 MG tablet 2 pills at outbreak onset repeat in 12h for each outbreak Patient taking differently: Take 2,000 mg by mouth daily as needed (outbreak). 2 pills at outbreak onset repeat in 12h for each outbreak 02/08/17  Yes Weber, Sarah L, PA-C  atorvastatin (LIPITOR) 10 MG tablet Take 1 tablet (10 mg total) by mouth daily. Patient not taking: Reported on 06/25/2019 03/13/19   Vivi Barrack, MD  diclofenac (VOLTAREN) 75 MG EC tablet Take 1 tablet (75 mg total) by mouth 2 (two) times daily. Patient not taking: Reported on 06/25/2019 04/02/19   Vivi Barrack, MD   Physical Exam: Blood pressure (!) 164/94, pulse 70, temperature 98.1 F (36.7 C), temperature source Oral, resp. rate 10, SpO2 92 %. 1. General:  in No  Acute distress  Well   -appearing 2. Psychological: Alert and  Oriented 3. Head/ENT:     Dry Mucous Membranes                          Head Non traumatic, neck supple                           Poor Dentition 4. SKIN:   decreased Skin turgor,  Skin clean Dry and intact no rash 5. Heart: Regular rate and rhythm no  Murmur, no Rub or gallop 6. Lungs:  no wheezes or crackles   7. Abdomen: Soft, non-tender, Non distended  bowel sounds present 8. Lower extremities: no clubbing, cyanosis, no  edema 9. Neurologically Grossly intact, moving all 4 extremities equally   10. MSK: limited  range of motion due to pain   All other LABS:     Recent Labs  Lab 06/25/19 1452  WBC 8.0  NEUTROABS 6.1  HGB 10.1*  HCT 32.0*  MCV 97.6  PLT 138*     Recent Labs  Lab 06/25/19 1452  NA 139  K 4.6  CL 103  CO2 28  GLUCOSE 94  BUN 13  CREATININE 0.63  CALCIUM 8.5*     No results for input(s): AST, ALT, ALKPHOS, BILITOT, PROT, ALBUMIN in the last 168 hours.     Cultures:    Component Value Date/Time   SDES URINE, CLEAN CATCH 03/20/2015 1003   SPECREQUEST NONE 03/20/2015 1003   CULT  03/20/2015 1003    MULTIPLE SPECIES PRESENT, SUGGEST RECOLLECTION Performed at [facility] 03/21/2015 FINAL 03/20/2015 1003     Radiological Exams on Admission: CT PELVIS WO CONTRAST  Result Date: 06/25/2019 CLINICAL DATA:  Left hip pain after a fall EXAM: CT PELVIS WITHOUT CONTRAST TECHNIQUE: Multidetector CT imaging of the pelvis was performed following the standard protocol without intravenous contrast. COMPARISON:  Radiographs from 06/25/2019 FINDINGS: Urinary Tract:  Urinary bladder unremarkable. Bowel:  Sigmoid  colon diverticulosis. Vascular/Lymphatic: Aortoiliac atherosclerotic vascular disease. Reproductive:  Unremarkable Other:  No supplemental non-categorized findings. Musculoskeletal: Nondisplaced acute fracture of the left anterior acetabular wall. Nondisplaced acute fracture of the left inferior pubic ramus. Subtle nondisplaced fracture the left sacral ala. Questionable transverse component of S3-4. Degenerative facet arthropathy at L5-S1 on the right. No fracture of the left proximal femur is identified. Mild spurring of the right femoral head. IMPRESSION: 1. Nondisplaced acute fractures of the left anterior acetabular wall, left inferior pubic ramus, and left sacral ala. 2. Sigmoid colon diverticulosis. 3. Aortoiliac atherosclerotic vascular disease. 4. Degenerative facet arthropathy at L5-S1 on the right. Aortic Atherosclerosis (ICD10-I70.0). Electronically Signed   By: Van Clines M.D.   On: 06/25/2019 17:07   DG Hip Unilat W or Wo Pelvis 2-3 Views Left  Result Date: 06/25/2019 CLINICAL DATA:  Left hip pain after a fall this morning. Initial encounter. EXAM: DG HIP (WITH OR WITHOUT PELVIS) 2-3V LEFT COMPARISON:  None. FINDINGS: There is no evidence of hip fracture or dislocation. There is no evidence of arthropathy or other focal bone abnormality. IMPRESSION: Negative exam. Electronically Signed   By: Inge Rise M.D.   On: 06/25/2019 13:51    Chart has been reviewed   Assessment/Plan  74 y.o. female with medical history significant  of depression frequent falls, HTN, HLD, GERD, OSA chronic diastolic CHF grade 1    Admitted for pelvic fractures  Present on Admission: . Pelvic fracture ([place]) -IV narcotic pain management as needed.  PT OT assessment may need placement patient lives alone severe pain with ambulation.  Unstable gait and frequent falls.   Marland Kitchen HYPERCHOLESTEROLEMIA-chronic stable continue home medications  . Essential hypertension -chronic stable restart Norvasc  . Hypothyroidism- - Check TSH continue home medications at current dose  . GERD - chronic stable  . Anemia -obtain anemia panel Hemoccult stool    Other plan as per orders.  DVT prophylaxis:  SCD       Code Status:   DNR/DNI  as per patient    I had personally discussed CODE STATUS with patient    Family Communication:   Family not at  Bedside   Disposition Plan:  likely will need placement for rehabilitation, will need PT OT eval                                                Would benefit from PT/OT eval prior to DC  Ordered                                       transition of care consulted                   Consults called: orthopedics aware    Admission status:  ED Disposition    ED Disposition Condition Comment   Admit  The patient appears reasonably stabilized for admission considering the current resources, flow, and capabilities available in the ED at this time, and I doubt any other [facility] requiring further screening and/or treatment in the ED prior to admission is  present.       Obs    Level of care      medical floor     Precautions: admitted as  asymptomatic screening protocol  PPE:  Used by the provider:   P100  eye Goggles,  Gloves    Toy Baker 06/25/2019, 8:22 PM    [facility]     after 2 AM please page floor coverage PA If 7AM-7PM, please contact the day team taking care of the patient using [email or web address]   Patient was evaluated in the context of the global COVID-19 pandemic, which  necessitated consideration that the patient might be at risk for infection with the SARS-CoV-2 virus that causes COVID-19. Institutional protocols and algorithms that pertain to the evaluation of patients at risk for COVID-19 are in a state of rapid change based on information released by regulatory bodies including the CDC and federal and state organizations. These policies and algorithms were followed during the patient's care.

## 2019-06-25 NOTE — ED Provider Notes (Signed)
[facility] DEPT Provider Note   CSN: [id number] Arrival date & time: 06/25/19  1255     History Chief Complaint  Patient presents with  . Fall  . Hip Pain    Jean Bowen is a 74 y.o. female.  74 yo F with a chief complaint of a fall.  Patient was holding a text box as she rotated in relation to stand her up stairs.  Fell approximately 2 stairs and landed on her left side.  Complaining of pain to the left hip.  Was able to ambulate with significant discomfort afterwards.  Denies head injury denies loss of consciousness denies neck pain back pain chest pain abdominal pain.  She says she has a little bit of pain in her left elbow and left forearm.  The history is provided by the patient.  Fall This is a new problem. The current episode started 1 to 2 hours ago. The problem occurs constantly. The problem has not changed since onset.Pertinent negatives include no chest pain, no headaches and no shortness of breath. Nothing aggravates the symptoms. Nothing relieves the symptoms. She has tried nothing for the symptoms. The treatment provided no relief.  Hip Pain Pertinent negatives include no chest pain, no headaches and no shortness of breath.       Past Medical History:  Diagnosis Date  . ANGIOEDEMA 08/22/09  . DEPRESSION 12/03/2006  . DERMATITIS, PERIORAL 01/08/2010  . Diverticulosis   . GERD 03/03/2007  . Hiatal hernia   . History of benign gastric tumor    significant for a benign gastric tumor that weighed approximately 5 pounds and was resected.  Marland Kitchen HYPERCHOLESTEROLEMIA 02/23/2010  . Hyperplastic colon polyp 12/03/2006  . HYPERTENSION 12/03/2006  . OSA (obstructive sleep apnea)   . OSTEOPOROSIS 12/03/2006  . Sleep apnea   . URINARY INCONTINENCE 12/03/2006    Patient Active Problem List   Diagnosis Date Noted  . Multiple pelvic fractures ([place]) 06/26/2019  . Anemia 06/25/2019  . Pelvic fracture ([place]) 06/25/2019  . Hyperglycemia 03/19/2019  .  Hyperparathyroidism ([place]) 02/12/2015  . Thrombocytopenia ([place]) 02/11/2015  . Dysphagia 03/02/2011  . HYPERCHOLESTEROLEMIA 02/23/2010  . Pruritus 01/08/2010  . ANGIOEDEMA 12/11/2009  . GERD 03/03/2007  . Hypothyroidism 12/03/2006  . 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 Essential hypertension 12/03/2006  . Osteoporosis 12/03/2006  . Overactive bladder 12/03/2006  . COLONIC POLYPS, HX OF 12/03/2006    Past Surgical History:  Procedure Laterality Date  . BUNIONECTOMY     both feet  . CATARACT EXTRACTION     Bil  . CHOLECYSTECTOMY    . ELECTROCARDIOGRAM  02/07/2006  . ESOPHAGOGASTRODUODENOSCOPY  11/17/2006  . Hx of nasal septal reconstruction for deviated septum  1980's  . KNEE ARTHROSCOPY     Bilateral-torn meniscus  . 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 Stress Cardiolite  02/14/2006  . TUMOR REMOVAL  2003   removal 5 lb benign tumor from abdomen     OB History   No obstetric history on file.     Family History  Problem Relation Age of Onset  . Melanoma Mother   . Lung cancer Mother   . Colon cancer Father        at advanced age  . Arthritis Father   . Lung cancer Brother   . Heart attack Other        grandfather  . Diabetes Other  grandfather/grandmother    Social History   Tobacco Use  . Smoking status: Former Smoker    Packs/day: 2.00    Years: 30.00    Pack years: 60.00    Types: Cigarettes    Quit date: 05/22/2001    Years since quitting: 18.1  . Smokeless tobacco: Never Used  Substance Use Topics  . Alcohol use: Yes    Alcohol/week: 3.0 standard drinks    Types: 3 Glasses of wine per week    Comment: 1-2/week  . Drug use: No    Home Medications Prior to Admission medications   Medication Sig Start Date End Date Taking? Authorizing Provider  acyclovir (ZOVIRAX) 400 MG tablet TAKE TWO TABLETS BY MOUTH EVERY 4 HOURS WHILE AWAKE Patient taking differently: Take 800  mg by mouth daily.  03/23/19  Yes Vivi Barrack, MD  amLODipine (NORVASC) 2.5 MG tablet Take 1 tablet (2.5 mg total) by mouth daily. 03/13/19  Yes Vivi Barrack, MD  escitalopram (LEXAPRO) 10 MG tablet Take 1 tablet (10 mg total) by mouth daily. 03/13/19  Yes Vivi Barrack, MD  hydrOXYzine (ATARAX/VISTARIL) 10 MG tablet Take 1 tablet (10 mg total) by mouth 3 (three) times daily as needed for itching. 03/13/19  Yes Vivi Barrack, MD  ibuprofen (ADVIL,MOTRIN) 100 MG/5ML suspension Take 200 mg by mouth as needed for fever or mild pain.    Yes [provider]  levothyroxine (SYNTHROID) 25 MCG tablet Take 1 tablet (25 mcg total) by mouth daily before breakfast. 03/13/19  Yes Vivi Barrack, MD  omeprazole (PRILOSEC) 40 MG capsule Take 1 capsule (40 mg total) by mouth daily. 10/16/14  Yes Lafayette Dragon, MD  oxybutynin (DITROPAN) 5 MG tablet Take 1 tablet (5 mg total) by mouth 3 (three) times daily. Patient taking differently: Take 5 mg by mouth daily.  03/13/19  Yes Vivi Barrack, MD  valACYclovir (VALTREX) 1000 MG tablet 2 pills at outbreak onset repeat in 12h for each outbreak Patient taking differently: Take 2,000 mg by mouth daily as needed (outbreak). 2 pills at outbreak onset repeat in 12h for each outbreak 02/08/17  Yes Weber, Sarah L, PA-C  atorvastatin (LIPITOR) 10 MG tablet Take 1 tablet (10 mg total) by mouth daily. Patient not taking: Reported on 06/25/2019 03/13/19   Vivi Barrack, MD  diclofenac (VOLTAREN) 75 MG EC tablet Take 1 tablet (75 mg total) by mouth 2 (two) times daily. Patient not taking: Reported on 06/25/2019 04/02/19   Vivi Barrack, MD    Allergies    Doxycycline, Losartan potassium, and Morphine and related  Review of Systems   Review of Systems  Constitutional: Negative for chills and fever.  HENT: Negative for congestion and rhinorrhea.   Eyes: Negative for redness and visual disturbance.  Respiratory: Negative for shortness of breath and wheezing.    Cardiovascular: Negative for chest pain and palpitations.  Gastrointestinal: Negative for nausea and vomiting.  Genitourinary: Negative for dysuria and urgency.  Musculoskeletal: Positive for arthralgias and myalgias.  Skin: Negative for pallor and wound.  Neurological: Negative for dizziness and headaches.    Physical Exam Updated Vital Signs BP 140/85 (BP Location: Left Arm)   Pulse 83   Temp 98.7 F (37.1 C) (Oral)   Resp 16   Ht 5\' 7"  (1.702 m)   Wt 89.4 kg   SpO2 93%   BMI 30.87 kg/m   Physical Exam Vitals and nursing note reviewed.  Constitutional:      General:  She is not in acute distress.    Appearance: She is well-developed. She is not diaphoretic.  HENT:     Head: Normocephalic and atraumatic.  Eyes:     Pupils: Pupils are equal, round, and reactive to light.  Cardiovascular:     Rate and Rhythm: Normal rate and regular rhythm.     Heart sounds: No murmur. No friction rub. No gallop.   Pulmonary:     Effort: Pulmonary effort is normal.     Breath sounds: No wheezing or rales.  Abdominal:     General: There is no distension.     Palpations: Abdomen is soft.     Tenderness: There is no abdominal tenderness.  Musculoskeletal:        General: Tenderness present.     Cervical back: Normal range of motion and neck supple.     Comments: Mild pain to the pubic rim.  No obvious tenderness about the femur.  Posterior tibialis intact motor and sensation.  No CVA tenderness with internal and external rotation of the hip.  Skin:    General: Skin is warm and dry.  Neurological:     Mental Status: She is alert and oriented to person, place, and time.  Psychiatric:        Behavior: Behavior normal.     ED Results / Procedures / Treatments   Labs (all labs ordered are listed, but only abnormal results are displayed) Labs Reviewed  CBC WITH DIFFERENTIAL/PLATELET - Abnormal; Notable for the following components:      Result Value   RBC 3.28 (*)    Hemoglobin  10.1 (*)    HCT 32.0 (*)    Platelets 138 (*)    All other components within normal limits  BASIC METABOLIC PANEL - Abnormal; Notable for the following components:   Calcium 8.5 (*)    All other components within normal limits  VITAMIN B12 - Abnormal; Notable for the following components:   Vitamin B-12 161 (*)    All other components within normal limits  BASIC METABOLIC PANEL - Abnormal; Notable for the following components:   Potassium 3.4 (*)    Glucose, Bld 112 (*)    Calcium 8.8 (*)    All other components within normal limits  SARS CORONAVIRUS 2 (TAT 6-24 HRS)  FOLATE  IRON AND TIBC  FERRITIN  RETICULOCYTES  CBC  VITAMIN D 25 HYDROXY (VIT D DEFICIENCY, FRACTURES)    EKG None  Radiology CT PELVIS WO CONTRAST  Result Date: 06/25/2019 CLINICAL DATA:  Left hip pain after a fall EXAM: CT PELVIS WITHOUT CONTRAST TECHNIQUE: Multidetector CT imaging of the pelvis was performed following the standard protocol without intravenous contrast. COMPARISON:  Radiographs from 06/25/2019 FINDINGS: Urinary Tract:  Urinary bladder unremarkable. Bowel:  Sigmoid colon diverticulosis. Vascular/Lymphatic: Aortoiliac atherosclerotic vascular disease. Reproductive:  Unremarkable Other:  No supplemental non-categorized findings. Musculoskeletal: Nondisplaced acute fracture of the left anterior acetabular wall. Nondisplaced acute fracture of the left inferior pubic ramus. Subtle nondisplaced fracture the left sacral ala. Questionable transverse component of S3-4. Degenerative facet arthropathy at L5-S1 on the right. No fracture of the left proximal femur is identified. Mild spurring of the right femoral head. IMPRESSION: 1. Nondisplaced acute fractures of the left anterior acetabular wall, left inferior pubic ramus, and left sacral ala. 2. Sigmoid colon diverticulosis. 3. Aortoiliac atherosclerotic vascular disease. 4. Degenerative facet arthropathy at L5-S1 on the right. Aortic Atherosclerosis  (ICD10-I70.0). Electronically Signed   By: Van Clines M.D.   On:  06/25/2019 17:07   DG Hip Unilat W or Wo Pelvis 2-3 Views Left  Result Date: 06/25/2019 CLINICAL DATA:  Left hip pain after a fall this morning. Initial encounter. EXAM: DG HIP (WITH OR WITHOUT PELVIS) 2-3V LEFT COMPARISON:  None. FINDINGS: There is no evidence of hip fracture or dislocation. There is no evidence of arthropathy or other focal bone abnormality. IMPRESSION: Negative exam. Electronically Signed   By: Inge Rise M.D.   On: 06/25/2019 13:51    Procedures Procedures (including critical care time)  Medications Ordered in ED Medications  fentaNYL (SUBLIMAZE) injection 50 mcg (0 mcg Intravenous Hold 06/25/19 1915)  acyclovir (ZOVIRAX) tablet 800 mg (800 mg Oral Given 06/26/19 1033)  amLODipine (NORVASC) tablet 2.5 mg (2.5 mg Oral Given 06/26/19 1033)  escitalopram (LEXAPRO) tablet 10 mg (10 mg Oral Given 06/26/19 1033)  hydrOXYzine (ATARAX/VISTARIL) tablet 10 mg (has no administration in time range)  levothyroxine (SYNTHROID) tablet 25 mcg (25 mcg Oral Given 06/26/19 0627)  oxybutynin (DITROPAN) tablet 5 mg (5 mg Oral Given 06/26/19 1034)  HYDROcodone-acetaminophen (NORCO/VICODIN) 5-325 MG per tablet 1-2 tablet (1 tablet Oral Given 06/26/19 1033)  methocarbamol (ROBAXIN) tablet 500 mg (500 mg Oral Given 06/25/19 2212)    Or  methocarbamol (ROBAXIN) 500 mg in dextrose 5 % 50 mL IVPB ( Intravenous See Alternative 06/25/19 2212)  HYDROmorphone (DILAUDID) injection 0.5 mg (has no administration in time range)  cyanocobalamin ((VITAMIN B-12)) injection 1,000 mcg (1,000 mcg Intramuscular Given 06/26/19 1039)  pantoprazole (PROTONIX) EC tablet 40 mg (has no administration in time range)  acetaminophen (TYLENOL) tablet 1,000 mg (1,000 mg Oral Given 06/25/19 1414)  oxyCODONE (Oxy IR/ROXICODONE) immediate release tablet 5 mg (5 mg Oral Given 06/25/19 1414)    ED Course  I have reviewed the triage vital signs and the nursing  notes.  Pertinent labs & imaging results that were available during my care of the patient were reviewed by me and considered in my medical decision making (see chart for details).  Clinical Course as of Jun 25 1636  Mon Jun 25, 2019  1904 Consult: Reviewed Dr. Roel Cluck for admission.   [MP]    Clinical Course User Index [MP] Charlesetta Shanks, MD   MDM Rules/Calculators/A&P                      74 yo F with a chief complaint of a fall.  Not syncopal in nature.  Complaining of left hip pain.  Not obviously broken on initial exam.  Will obtain a plain film.  Plain film viewed by me without obvious fracture.  There appears to be some irregularity to the inferior pubic rami on my view.  Read is likely normal.  Patient still with severe pain with minimal movement will obtain a CT scan to further evaluate.  Signed out to Dr. Johnney Killian, please see her note for further details of care.   The patients results and plan were reviewed and discussed.   Any x-rays performed were independently reviewed by myself.   Differential diagnosis were considered with the presenting HPI.  Medications  fentaNYL (SUBLIMAZE) injection 50 mcg (0 mcg Intravenous Hold 06/25/19 1915)  acyclovir (ZOVIRAX) tablet 800 mg (800 mg Oral Given 06/26/19 1033)  amLODipine (NORVASC) tablet 2.5 mg (2.5 mg Oral Given 06/26/19 1033)  escitalopram (LEXAPRO) tablet 10 mg (10 mg Oral Given 06/26/19 1033)  hydrOXYzine (ATARAX/VISTARIL) tablet 10 mg (has no administration in time range)  levothyroxine (SYNTHROID) tablet 25 mcg (25 mcg Oral Given  06/26/19 0627)  oxybutynin (DITROPAN) tablet 5 mg (5 mg Oral Given 06/26/19 1034)  HYDROcodone-acetaminophen (NORCO/VICODIN) 5-325 MG per tablet 1-2 tablet (1 tablet Oral Given 06/26/19 1033)  methocarbamol (ROBAXIN) tablet 500 mg (500 mg Oral Given 06/25/19 2212)    Or  methocarbamol (ROBAXIN) 500 mg in dextrose 5 % 50 mL IVPB ( Intravenous See Alternative 06/25/19 2212)  HYDROmorphone (DILAUDID) injection  0.5 mg (has no administration in time range)  cyanocobalamin ((VITAMIN B-12)) injection 1,000 mcg (1,000 mcg Intramuscular Given 06/26/19 1039)  pantoprazole (PROTONIX) EC tablet 40 mg (has no administration in time range)  acetaminophen (TYLENOL) tablet 1,000 mg (1,000 mg Oral Given 06/25/19 1414)  oxyCODONE (Oxy IR/ROXICODONE) immediate release tablet 5 mg (5 mg Oral Given 06/25/19 1414)    Vitals:   06/25/19 2309 06/26/19 0531 06/26/19 0933 06/26/19 1422  BP:  (!) 150/89 (!) 144/75 140/85  Pulse:  78 77 83  Resp:  18 17 16   Temp:  98.9 F (37.2 C) 98.5 F (36.9 C) 98.7 F (37.1 C)  TempSrc:  Oral Oral Oral  SpO2:  92% 95% 93%  Weight: 89.4 kg     Height: 5\' 7"  (1.702 m)       Final diagnoses:  Closed fracture of other parts of pelvis, initial encounter [facility])    Admission/ observation were discussed with the admitting physician, patient and/or family and they are comfortable with the plan.   Final Clinical Impression(s) / ED Diagnoses Final diagnoses:  Closed fracture of other parts of pelvis, initial encounter [facility])    Rx / [place] Orders ED Discharge Orders    None       Deno Etienne, DO 06/26/19 1638

## 2019-06-26 DIAGNOSIS — W108XXA Fall (on) (from) other stairs and steps, initial encounter: Secondary | ICD-10-CM | POA: Diagnosis present

## 2019-06-26 DIAGNOSIS — Z800 Family history of malignant neoplasm of digestive organs: Secondary | ICD-10-CM | POA: Diagnosis not present

## 2019-06-26 DIAGNOSIS — Z8249 Family history of ischemic heart disease and other diseases of the circulatory system: Secondary | ICD-10-CM | POA: Diagnosis not present

## 2019-06-26 DIAGNOSIS — Z87891 Personal history of nicotine dependence: Secondary | ICD-10-CM | POA: Diagnosis not present

## 2019-06-26 DIAGNOSIS — S32415A Nondisplaced fracture of anterior wall of left acetabulum, initial encounter for closed fracture: Secondary | ICD-10-CM

## 2019-06-26 DIAGNOSIS — E538 Deficiency of other specified B group vitamins: Secondary | ICD-10-CM | POA: Diagnosis present

## 2019-06-26 DIAGNOSIS — E7800 Pure hypercholesterolemia, unspecified: Secondary | ICD-10-CM | POA: Diagnosis present

## 2019-06-26 DIAGNOSIS — N3281 Overactive bladder: Secondary | ICD-10-CM | POA: Diagnosis present

## 2019-06-26 DIAGNOSIS — S32492A Other specified fracture of left acetabulum, initial encounter for closed fracture: Secondary | ICD-10-CM | POA: Diagnosis present

## 2019-06-26 DIAGNOSIS — R296 Repeated falls: Secondary | ICD-10-CM | POA: Diagnosis present

## 2019-06-26 DIAGNOSIS — I110 Hypertensive heart disease with heart failure: Secondary | ICD-10-CM | POA: Diagnosis present

## 2019-06-26 DIAGNOSIS — Z79899 Other long term (current) drug therapy: Secondary | ICD-10-CM | POA: Diagnosis not present

## 2019-06-26 DIAGNOSIS — Z79890 Hormone replacement therapy (postmenopausal): Secondary | ICD-10-CM | POA: Diagnosis not present

## 2019-06-26 DIAGNOSIS — S32810A Multiple fractures of pelvis with stable disruption of pelvic ring, initial encounter for closed fracture: Secondary | ICD-10-CM | POA: Diagnosis not present

## 2019-06-26 DIAGNOSIS — S3282XA Multiple fractures of pelvis without disruption of pelvic ring, initial encounter for closed fracture: Secondary | ICD-10-CM

## 2019-06-26 DIAGNOSIS — E039 Hypothyroidism, unspecified: Secondary | ICD-10-CM | POA: Diagnosis present

## 2019-06-26 DIAGNOSIS — S32592A Other specified fracture of left pubis, initial encounter for closed fracture: Secondary | ICD-10-CM | POA: Diagnosis present

## 2019-06-26 DIAGNOSIS — S3289XA Fracture of other parts of pelvis, initial encounter for closed fracture: Secondary | ICD-10-CM | POA: Diagnosis not present

## 2019-06-26 DIAGNOSIS — D649 Anemia, unspecified: Secondary | ICD-10-CM | POA: Diagnosis not present

## 2019-06-26 DIAGNOSIS — S32110A Nondisplaced Zone I fracture of sacrum, initial encounter for closed fracture: Secondary | ICD-10-CM | POA: Diagnosis present

## 2019-06-26 DIAGNOSIS — E876 Hypokalemia: Secondary | ICD-10-CM | POA: Diagnosis present

## 2019-06-26 DIAGNOSIS — Y92008 Other place in unspecified non-institutional (private) residence as the place of occurrence of the external cause: Secondary | ICD-10-CM | POA: Diagnosis not present

## 2019-06-26 DIAGNOSIS — Z20822 Contact with and (suspected) exposure to covid-19: Secondary | ICD-10-CM | POA: Diagnosis present

## 2019-06-26 DIAGNOSIS — I5032 Chronic diastolic (congestive) heart failure: Secondary | ICD-10-CM | POA: Diagnosis present

## 2019-06-26 DIAGNOSIS — Z808 Family history of malignant neoplasm of other organs or systems: Secondary | ICD-10-CM | POA: Diagnosis not present

## 2019-06-26 DIAGNOSIS — Z801 Family history of malignant neoplasm of trachea, bronchus and lung: Secondary | ICD-10-CM | POA: Diagnosis not present

## 2019-06-26 DIAGNOSIS — F329 Major depressive disorder, single episode, unspecified: Secondary | ICD-10-CM | POA: Diagnosis present

## 2019-06-26 DIAGNOSIS — Z66 Do not resuscitate: Secondary | ICD-10-CM | POA: Diagnosis present

## 2019-06-26 DIAGNOSIS — Z8673 Personal history of transient ischemic attack (TIA), and cerebral infarction without residual deficits: Secondary | ICD-10-CM | POA: Diagnosis not present

## 2019-06-26 DIAGNOSIS — K219 Gastro-esophageal reflux disease without esophagitis: Secondary | ICD-10-CM | POA: Diagnosis present

## 2019-06-26 DIAGNOSIS — I10 Essential (primary) hypertension: Secondary | ICD-10-CM | POA: Diagnosis not present

## 2019-06-26 DIAGNOSIS — Z85820 Personal history of malignant melanoma of skin: Secondary | ICD-10-CM | POA: Diagnosis not present

## 2019-06-26 LAB — RETICULOCYTES
Immature Retic Fract: 10.8 % (ref 2.3–15.9)
RBC.: 4.46 MIL/uL (ref 3.87–5.11)
Retic Count, Absolute: 68.7 10*3/uL (ref 19.0–186.0)
Retic Ct Pct: 1.5 % (ref 0.4–3.1)

## 2019-06-26 LAB — BASIC METABOLIC PANEL
Anion gap: 8 (ref 5–15)
BUN: 11 mg/dL (ref 8–23)
CO2: 28 mmol/L (ref 22–32)
Calcium: 8.8 mg/dL — ABNORMAL LOW (ref 8.9–10.3)
Chloride: 103 mmol/L (ref 98–111)
Creatinine, Ser: 0.51 mg/dL (ref 0.44–1.00)
GFR calc Af Amer: >60 mL/min (ref >60)
GFR calc non Af Amer: >60 mL/min (ref >60)
Glucose, Bld: 112 mg/dL — ABNORMAL HIGH (ref 70–99)
Potassium: 3.4 mmol/L — ABNORMAL LOW (ref 3.5–5.1)
Sodium: 139 mmol/L (ref 135–145)

## 2019-06-26 LAB — FERRITIN: Ferritin: 68 ng/mL (ref 11–307)

## 2019-06-26 LAB — CBC
HCT: 43.6 % (ref 36.0–46.0)
Hemoglobin: 14 g/dL (ref 12.0–15.0)
MCH: 30.4 pg (ref 26.0–34.0)
MCHC: 32.1 g/dL (ref 30.0–36.0)
MCV: 94.8 fL (ref 80.0–100.0)
Platelets: 209 10*3/uL (ref 150–400)
RBC: 4.6 MIL/uL (ref 3.87–5.11)
RDW: 13.5 % (ref 11.5–15.5)
WBC: 6.1 10*3/uL (ref 4.0–10.5)
nRBC: 0 % (ref 0.0–0.2)

## 2019-06-26 LAB — IRON AND TIBC
Iron: 78 ug/dL (ref 28–170)
Saturation Ratios: 22 % (ref 10.4–31.8)
TIBC: 362 ug/dL (ref 250–450)
UIBC: 284 ug/dL

## 2019-06-26 LAB — SARS CORONAVIRUS 2 (TAT 6-24 HRS): SARS Coronavirus 2: NEGATIVE

## 2019-06-26 LAB — VITAMIN B12: Vitamin B-12: 161 pg/mL — ABNORMAL LOW (ref 180–914)

## 2019-06-26 LAB — VITAMIN D 25 HYDROXY (VIT D DEFICIENCY, FRACTURES): Vit D, 25-Hydroxy: 36.2 ng/mL (ref 30–100)

## 2019-06-26 LAB — FOLATE: Folate: 10 ng/mL (ref >5.9)

## 2019-06-26 MED ORDER — PANTOPRAZOLE SODIUM 40 MG PO TBEC
40.0000 mg | DELAYED_RELEASE_TABLET | Freq: Every day | ORAL | Status: DC
  Administered 2019-06-27 – 2019-06-28 (×2): 40 mg via ORAL
  Filled 2019-06-26 (×3): qty 1

## 2019-06-26 MED ORDER — CYANOCOBALAMIN 1000 MCG/ML IJ SOLN
1000.0000 ug | Freq: Every day | INTRAMUSCULAR | Status: DC
  Administered 2019-06-26 – 2019-06-28 (×3): 1000 ug via INTRAMUSCULAR
  Filled 2019-06-26 (×3): qty 1

## 2019-06-26 MED ORDER — CYANOCOBALAMIN 1000 MCG/ML IJ SOLN
1000.0000 ug | Freq: Once | INTRAMUSCULAR | Status: DC
  Filled 2019-06-26: qty 1

## 2019-06-26 NOTE — Progress Notes (Addendum)
PROGRESS NOTE  Jean Bowen  [id number] DOB: [birth year] DOA: 06/25/2019 PCP: Vivi Barrack, MD   Brief Narrative: Jean Bowen is a 74 y.o. female with a history of HFpEF, hypothyroidism, OSA, GERD, HTN, HLD, and depression who presented to the ED by EMS after falling at home, where she lives alone, with immediate severe left groin/hip pain found to have nondisplaced fractures of the left pubic ramus, acetabular, and sacral ala. Orthopedics was consulted, recommended PT, WBAT, and pain medication. She will require skilled nursing facility level of care for adequate rehabilitation.  Assessment & Plan: Active Problems:   Hypothyroidism   HYPERCHOLESTEROLEMIA   Essential hypertension   GERD   Anemia   Pelvic fracture (H[facility])   Multiple pelvic fractures ([place])  Fall at home with multiple pelvic fractures: Left nondisplaced pubic ramus, nondisplaced anterior wall acetabular and nondisplaced sacral ala fractures. - Nonoperative management per orthopedics, Dr. Doran Durand. WBAT LLE.  - Continue PT/OT - Continue analgesics as ordered  HTN:  - Continue norvasc  HLD: Patient reports not taking statin.  Hypothyroidism:  - Continue synthroid  GERD:  - Continue PPI  Depression:  - Continue lexapro  Vitamin B12 deficiency: No anemia/macrocytosis currently. No neuropathy noted.  - Supplement.   OAB:  - Continue oxybutynin.  Low hemoglobin level: Possibly a spurious result as hgb this AM was 14. No reports of bleeding.  History of right frontal intraparenchymal hemorrhage: No neurological deficits. This was in 2016. No further work up is planned at this time.   DVT prophylaxis: SCDs Code Status: DNR Family Communication: None at bedside Disposition Plan: SNF  Consultants:   Orthopedics  Procedures:   None  Antimicrobials:  None   Subjective: Pain is controlled, but any weight bearing provokes severe pain in the left hip/groin area. No bleeding noted. No other complaints.  Does not feel like she would be able to rehab at home.  Objective: Vitals:   06/25/19 2141 06/25/19 2309 06/26/19 0531 06/26/19 0933  BP: (!) 171/91  (!) 150/89 (!) 144/75  Pulse: 74  78 77  Resp: 18  18 17   Temp: 99 F (37.2 C)  98.9 F (37.2 C) 98.5 F (36.9 C)  TempSrc: Oral  Oral Oral  SpO2: 93%  92% 95%  Weight:  89.4 kg    Height:  5\' 7"  (1.702 m)      Intake/Output Summary (Last 24 hours) at 06/26/2019 1415 Last data filed at 06/26/2019 1036 Gross per 24 hour  Intake 572.1 ml  Output 1000 ml  Net -427.9 ml   Filed Weights   06/25/19 2309  Weight: 89.4 kg    Gen: 74 y.o. female in no distress  Pulm: Non-labored breathing. Clear to auscultation bilaterally.  CV: Regular rate and rhythm. No murmur, rub, or gallop. No JVD, no pedal edema. GI: Abdomen soft, non-tender, non-distended, with normoactive bowel sounds. No organomegaly or masses felt. Ext: Warm, no deformities Skin: Ecchymosis along left posterolateral superior left leg and left upper arm. Neuro: Alert and oriented. No focal neurological deficits. Psych: Judgement and insight appear normal. Mood & affect appropriate.   Data Reviewed: I have personally reviewed following labs and imaging studies  CBC: Recent Labs  Lab 06/25/19 1452 06/26/19 0312  WBC 8.0 6.1  NEUTROABS 6.1  --   HGB 10.1* 14.0  HCT 32.0* 43.6  MCV 97.6 94.8  PLT 138* [id number]   Basic Metabolic Panel: Recent Labs  Lab 06/25/19 1452 06/26/19 0312  NA 139 139  K 4.6 3.4*  CL 103 103  CO2 28 28  GLUCOSE 94 112*  BUN 13 11  CREATININE 0.63 0.51  CALCIUM 8.5* 8.8*   Anemia Panel: Recent Labs    06/26/19 0312  VITAMINB12 161*  FOLATE 10.0  FERRITIN 68  TIBC 362  IRON 78  RETICCTPCT 1.5   Urine analysis:    Component Value Date/Time   COLORURINE YELLOW 06/11/2016 1518   APPEARANCEUR CLEAR 06/11/2016 1518   LABSPEC 1.025 06/11/2016 1518   PHURINE 6.0 06/11/2016 1518   GLUCOSEU NEGATIVE 06/11/2016 1518   HGBUR SMALL (A)  06/11/2016 1518   BILIRUBINUR NEGATIVE 06/11/2016 1518   KETONESUR NEGATIVE 06/11/2016 1518   PROTEINUR NEGATIVE 03/20/2015 1003   UROBILINOGEN 0.2 06/11/2016 1518   NITRITE NEGATIVE 06/11/2016 1518   LEUKOCYTESUR NEGATIVE 06/11/2016 1518   Recent Results (from the past 240 hour(s))  SARS CORONAVIRUS 2 (TAT 6-24 HRS) Nasopharyngeal Nasopharyngeal Swab     Status: None   Collection Time: 06/25/19  6:23 PM   Specimen: Nasopharyngeal Swab  Result Value Ref Range Status   SARS Coronavirus 2 NEGATIVE NEGATIVE Final    Comment: (NOTE) SARS-CoV-2 target nucleic acids are NOT DETECTED. The SARS-CoV-2 RNA is generally detectable in upper and lower respiratory specimens during the acute phase of infection. Negative results do not preclude SARS-CoV-2 infection, do not rule out co-infections with other pathogens, and should not be used as the sole basis for treatment or other patient management decisions. Negative results must be combined with clinical observations, patient history, and epidemiological information. The expected result is Negative. Fact Sheet for Patients: SugarRoll.be Fact Sheet for Healthcare Providers: https://www.woods-mathews.com/ This test is not yet approved or cleared by the [place] FDA and  has been authorized for detection and/or diagnosis of SARS-CoV-2 by FDA under an Emergency Use Authorization (EUA). This EUA will remain  in effect (meaning this test can be used) for the duration of the COVID-19 declaration under Section 56 4(b)(1) of the Act, 21 U.S.C. section 360bbb-3(b)(1), unless the authorization is terminated or revoked sooner. Performed at [facility], [place] [address] [place], [place] [address]       Radiology Studies: CT PELVIS WO CONTRAST  Result Date: 06/25/2019 CLINICAL DATA:  Left hip pain after a fall EXAM: CT PELVIS WITHOUT CONTRAST TECHNIQUE: Multidetector CT imaging of the pelvis was  performed following the standard protocol without intravenous contrast. COMPARISON:  Radiographs from 06/25/2019 FINDINGS: Urinary Tract:  Urinary bladder unremarkable. Bowel:  Sigmoid colon diverticulosis. Vascular/Lymphatic: Aortoiliac atherosclerotic vascular disease. Reproductive:  Unremarkable Other:  No supplemental non-categorized findings. Musculoskeletal: Nondisplaced acute fracture of the left anterior acetabular wall. Nondisplaced acute fracture of the left inferior pubic ramus. Subtle nondisplaced fracture the left sacral ala. Questionable transverse component of S3-4. Degenerative facet arthropathy at L5-S1 on the right. No fracture of the left proximal femur is identified. Mild spurring of the right femoral head. IMPRESSION: 1. Nondisplaced acute fractures of the left anterior acetabular wall, left inferior pubic ramus, and left sacral ala. 2. Sigmoid colon diverticulosis. 3. Aortoiliac atherosclerotic vascular disease. 4. Degenerative facet arthropathy at L5-S1 on the right. Aortic Atherosclerosis (ICD10-I70.0). Electronically Signed   By: Van Clines M.D.   On: 06/25/2019 17:07   DG Hip Unilat W or Wo Pelvis 2-3 Views Left  Result Date: 06/25/2019 CLINICAL DATA:  Left hip pain after a fall this morning. Initial encounter. EXAM: DG HIP (WITH OR WITHOUT PELVIS) 2-3V LEFT COMPARISON:  None. FINDINGS: There is no evidence of hip fracture or dislocation.  There is no evidence of arthropathy or other focal bone abnormality. IMPRESSION: Negative exam. Electronically Signed   By: Inge Rise M.D.   On: 06/25/2019 13:51    Scheduled Meds: . acyclovir  800 mg Oral Daily  . amLODipine  2.5 mg Oral Daily  . cyanocobalamin  1,000 mcg Intramuscular Daily  . escitalopram  10 mg Oral Daily  . fentaNYL (SUBLIMAZE) injection  50 mcg Intravenous Once  . levothyroxine  25 mcg Oral QAC breakfast  . oxybutynin  5 mg Oral Daily  . pantoprazole  40 mg Oral Daily   Continuous Infusions: .  methocarbamol (ROBAXIN) IV       LOS: 0 days   Time spent: 25 minutes.  Patrecia Pour, MD [facility] [email or web address] 06/26/2019, 2:15 PM

## 2019-06-26 NOTE — Evaluation (Signed)
Occupational Therapy Evaluation Patient Details Name: Jean Bowen MRN: [id number] DOB: 1945-10-05 Today's Date: 06/26/2019    History of Present Illness Pt is a 74 year old female admitted after fall while trying to get packages on the porch and sustained left nondisplaced pubic ramus, nondisplaced anterior wall acetabular and nondisplaced sacral ala fractures   Clinical Impression   Pt admitted with the above. Pt currently with functional limitations due to the deficits listed below (see OT Problem List).  Pt will benefit from skilled OT to increase their safety and independence with ADL and functional mobility for ADL to facilitate discharge to venue listed below.      Follow Up Recommendations  SNF  Or HH with 24/7 or ALF   Equipment Recommendations  3 in 1 bedside commode    Recommendations for Other Services       Precautions / Restrictions Precautions Precautions: Fall Restrictions Weight Bearing Restrictions: No      Mobility Bed Mobility Overal bed mobility: Needs Assistance Bed Mobility: Sit to Supine       Sit to supine: Mod assist   General bed mobility comments: pt in recliner on arrival; assisted with bringing bil LEs on to bed for pain control  Transfers Overall transfer level: Needs assistance Equipment used: Rolling walker (2 wheeled) Transfers: Sit to/from Stand Sit to Stand: Min assist         General transfer comment: verbal cues for UE positioning, required assist to rise and steady    Balance Overall balance assessment: Needs assistance Sitting-balance support: Single extremity supported Sitting balance-Leahy Scale: Good     Standing balance support: Bilateral upper extremity supported Standing balance-Leahy Scale: Poor                             ADL either performed or assessed with clinical judgement   ADL Overall ADL's : Needs assistance/impaired Eating/Feeding: Set up;Sitting   Grooming: Wash/dry hands;Wash/dry  face;Sitting;Set up   Upper Body Bathing: Set up;Sitting   Lower Body Bathing: Maximal assistance;Sit to/from stand;Cueing for safety   Upper Body Dressing : Set up;Sitting   Lower Body Dressing: Maximal assistance;Sit to/from stand;Cueing for compensatory techniques;Cueing for safety;Cueing for sequencing   Toilet Transfer: Minimal assistance;Stand-pivot;Cueing for safety;Cueing for sequencing   Toileting- Clothing Manipulation and Hygiene: Moderate assistance;Sit to/from stand       Functional mobility during ADLs: Moderate assistance;Rolling walker General ADL Comments: increased time     Vision Patient Visual Report: No change from baseline              Pertinent Vitals/Pain Pain Assessment: 0-10 Pain Score: 9  Pain Location: L groin area Pain Descriptors / Indicators: Jabbing;Guarding;Sore Pain Intervention(s): Repositioned;Monitored during session     Hand Dominance     Extremity/Trunk Assessment Upper Extremity Assessment Upper Extremity Assessment: Generalized weakness   Lower Extremity Assessment Lower Extremity Assessment: Generalized weakness;LLE deficits/detail LLE Deficits / Details: movement limited by pain       Communication Communication Communication: No difficulties   Cognition Arousal/Alertness: Awake/alert Behavior During Therapy: WFL for tasks assessed/performed Overall Cognitive Status: Within Functional Limits for tasks assessed                                                Home Living Family/patient expects to be discharged to:: Private residence  Living Arrangements: Alone   Type of Home: House Home Access: Level entry Entrance Stairs-Number of Steps: does have steps on porch but can enter another way without steps   Home Layout: Two level;Bed/bath upstairs     Bathroom Shower/Tub: Tub/shower unit;Walk-in shower   Bathroom Toilet: Standard     Home Equipment: Environmental consultant - 2 wheels;Wheelchair - manual           Prior Functioning/Environment Level of Independence: Independent                 OT Problem List: Decreased strength;Decreased activity tolerance;Impaired balance (sitting and/or standing);Decreased knowledge of use of DME or AE;Decreased safety awareness;Pain      OT Treatment/Interventions: Self-care/ADL training;Patient/family education;DME and/or AE instruction    OT Goals(Current goals can be found in the care plan section) Acute Rehab OT Goals Patient Stated Goal: get well and get back home OT Goal Formulation: With patient Time For Goal Achievement: 06/26/19 ADL Goals Pt Will Perform Lower Body Bathing: with supervision;sit to/from stand Pt Will Perform Lower Body Dressing: with supervision;sit to/from stand Pt Will Transfer to Toilet: with supervision;bedside commode  OT Frequency: Min 2X/week    AM-PAC OT "6 Clicks" Daily Activity     Outcome Measure Help from another person eating meals?: None Help from another person taking care of personal grooming?: None Help from another person toileting, which includes using toliet, bedpan, or urinal?: A Lot Help from another person bathing (including washing, rinsing, drying)?: A Little Help from another person to put on and taking off regular upper body clothing?: A Lot Help from another person to put on and taking off regular lower body clothing?: A Little 6 Click Score: 18   End of Session Equipment Utilized During Treatment: Rolling walker Nurse Communication: Mobility status  Activity Tolerance: Patient tolerated treatment well;Patient limited by pain Patient left: in chair;with call bell/phone within reach  OT Visit Diagnosis: Unsteadiness on feet (R26.81);Other abnormalities of gait and mobility (R26.89);Muscle weakness (generalized) (M62.81);History of falling (Z91.81)                Time: 1000-1030 OT Time Calculation (min): 30 min Charges:  OT General Charges $OT Visit: 1 Visit OT Evaluation $OT Eval  Moderate Complexity: 1 Mod OT Treatments $Self Care/Home Management : 8-22 mins  Jean Bowen, OT Acute Rehabilitation Services [phone] Office- [phone]     Jean Bowen, Edwena Felty D 06/26/2019, 2:02 PM

## 2019-06-26 NOTE — Evaluation (Signed)
Physical Therapy Evaluation Patient Details Name: Jean Bowen MRN: [id number] DOB: [birth year] Today's Date: 06/26/2019   History of Present Illness  Pt is a 74 year old female admitted after fall while trying to get packages on the porch and sustained left nondisplaced pubic ramus, nondisplaced anterior wall acetabular and nondisplaced sacral ala fractures  Clinical Impression  Pt admitted with above diagnosis.  Pt currently with functional limitations due to the deficits listed below (see PT Problem List). Pt will benefit from skilled PT to increase their independence and safety with mobility to allow discharge to the venue listed below.  Pt currently requiring min assist for mobility at this time.  Pt with slow effortful movement due to left "groin" pain.  Pt would benefit from 24/7 assist upon d/c.  Pt considering her options with [facility] (states her husband had been there).     Follow Up Recommendations Supervision/Assistance - 24 hour;SNF(recommending 24/7 assist, pt seeing if she can obtain for home but if not may need SNF)    Equipment Recommendations  None recommended by PT    Recommendations for Other Services       Precautions / Restrictions Precautions Precautions: Fall Restrictions Weight Bearing Restrictions: No      Mobility  Bed Mobility Overal bed mobility: Needs Assistance Bed Mobility: Sit to Supine       Sit to supine: Mod assist   General bed mobility comments: pt in recliner on arrival; assisted with bringing bil LEs on to bed for pain control  Transfers Overall transfer level: Needs assistance Equipment used: Rolling walker (2 wheeled) Transfers: Sit to/from Stand Sit to Stand: Min assist         General transfer comment: verbal cues for UE positioning, required assist to rise and steady  Ambulation/Gait Ambulation/Gait assistance: Min assist Gait Distance (Feet): 35 Feet Assistive device: Rolling walker (2 wheeled) Gait  Pattern/deviations: Step-to pattern;Antalgic;Decreased stance time - left     General Gait Details: verbal cues for sequence, RW positioning, step length; pt encouraged to use UEs to weight bear on RW for better pain control of L LE.  distance limited by fatigue and pain  Stairs            Wheelchair Mobility    Modified Rankin (Stroke Patients Only)       Balance                                             Pertinent Vitals/Pain Pain Assessment: 0-10 Pain Score: 9  Pain Location: L groin area Pain Descriptors / Indicators: Jabbing;Guarding;Sore Pain Intervention(s): Repositioned;Monitored during session    Home Living Family/patient expects to be discharged to:: Private residence Living Arrangements: Alone   Type of Home: House Home Access: Level entry   Entrance Stairs-Number of Steps: does have steps on porch but can enter another way without steps Home Layout: Two level;Bed/bath upstairs Home Equipment: Walker - 2 wheels;Wheelchair - manual      Prior Function Level of Independence: Independent               Hand Dominance        Extremity/Trunk Assessment   Upper Extremity Assessment Upper Extremity Assessment: Generalized weakness    Lower Extremity Assessment Lower Extremity Assessment: Generalized weakness;LLE deficits/detail LLE Deficits / Details: movement limited by pain       Communication   Communication: No  difficulties  Cognition Arousal/Alertness: Awake/alert Behavior During Therapy: WFL for tasks assessed/performed Overall Cognitive Status: Within Functional Limits for tasks assessed                                        General Comments      Exercises     Assessment/Plan    PT Assessment Patient needs continued PT services  PT Problem List Decreased strength;Decreased activity tolerance;Decreased balance;Pain;Decreased knowledge of use of DME;Decreased mobility       PT  Treatment Interventions DME instruction;Therapeutic exercise;Gait training;Balance training;Functional mobility training;Therapeutic activities;Patient/family education;Stair training    PT Goals (Current goals can be found in the Care Plan section)  Acute Rehab PT Goals PT Goal Formulation: With patient Time For Goal Achievement: 07/10/19 Potential to Achieve Goals: Good    Frequency Min 3X/week   Barriers to discharge        Co-evaluation               AM-PAC PT "6 Clicks" Mobility  Outcome Measure Help needed turning from your back to your side while in a flat bed without using bedrails?: A Little Help needed moving from lying on your back to sitting on the side of a flat bed without using bedrails?: A Little Help needed moving to and from a bed to a chair (including a wheelchair)?: A Little Help needed standing up from a chair using your arms (e.g., wheelchair or bedside chair)?: A Little Help needed to walk in hospital room?: A Little Help needed climbing 3-5 steps with a railing? : A Lot 6 Click Score: 17    End of Session Equipment Utilized During Treatment: Gait belt Activity Tolerance: Patient limited by pain Patient left: in bed;with call bell/phone within reach   PT Visit Diagnosis: Difficulty in walking, not elsewhere classified (R26.2)    Time: [id number] PT Time Calculation (min) (ACUTE ONLY): 20 min   Charges:   PT Evaluation $PT Eval Low Complexity: 1 Low     Kati PT, DPT Acute Rehabilitation Services Office: [phone]  Trena Platt 06/26/2019, 12:47 PM

## 2019-06-26 NOTE — Progress Notes (Signed)
Subjective:     Patient reports pain as moderate.  Resting comfortably in bed.  Reports that she worked well with therapy.  Patient reports that she would like to go to [facility] for a few week as she lives alone.  Objective:   VITALS:  Temp:  [98.4 F (36.9 C)-99 F (37.2 C)] 98.7 F (37.1 C) (03/09 1422) Pulse Rate:  [66-83] 83 (03/09 1422) Resp:  [9-19] 16 (03/09 1422) BP: (140-177)/(75-98) 140/85 (03/09 1422) SpO2:  [90 %-95 %] 93 % (03/09 1422) Weight:  [89.4 kg] 89.4 kg (03/08 2309)  General: WDWN patient in NAD. Psych:  Appropriate mood and affect. Neuro:  A&O x 3, Moving all extremities, sensation intact to light touch HEENT:  EOMs intact Chest:  Even non-labored respirations Skin:  Incision C/D/I, no rashes or lesions Extremities: warm/dry, no visible edema, erythema or echymosis.  No lymphadenopathy. Pulses: Popliteus 2+ MSK:  ROM: TKE, MMT: able to perform quad set, (-) Homan's    LABS Recent Labs    06/25/19 1452 06/26/19 0312  HGB 10.1* 14.0  WBC 8.0 6.1  PLT 138* 209   Recent Labs    06/25/19 1452 06/26/19 0312  NA 139 139  K 4.6 3.4*  CL 103 103  CO2 28 28  BUN 13 11  CREATININE 0.63 0.51  GLUCOSE 94 112*   No results for input(s): LABPT, INR in the last 72 hours.   Assessment/Plan:    Left nondisplaced pubic ramus, nondisplaced anterior wall acetabular and nondisplaced sacral ala fxs:  Up with therapy WBAT  Disp:  PT recommends SNF or HH with 24/7    Mechele Claude PA-C [facility] Office:  [phone]

## 2019-06-27 DIAGNOSIS — S3289XA Fracture of other parts of pelvis, initial encounter for closed fracture: Secondary | ICD-10-CM

## 2019-06-27 LAB — BASIC METABOLIC PANEL
Anion gap: 9 (ref 5–15)
BUN: 18 mg/dL (ref 8–23)
CO2: 29 mmol/L (ref 22–32)
Calcium: 8.9 mg/dL (ref 8.9–10.3)
Chloride: 100 mmol/L (ref 98–111)
Creatinine, Ser: 0.88 mg/dL (ref 0.44–1.00)
GFR calc Af Amer: >60 mL/min (ref >60)
GFR calc non Af Amer: >60 mL/min (ref >60)
Glucose, Bld: 89 mg/dL (ref 70–99)
Potassium: 3.6 mmol/L (ref 3.5–5.1)
Sodium: 138 mmol/L (ref 135–145)

## 2019-06-27 LAB — MAGNESIUM: Magnesium: 2.2 mg/dL (ref 1.7–2.4)

## 2019-06-27 MED ORDER — ASPIRIN EC 81 MG PO TBEC: 81.0000 mg | DELAYED_RELEASE_TABLET | Freq: Two times a day (BID) | ORAL | 0 refills | Status: DC

## 2019-06-27 MED ORDER — HYDROCODONE-ACETAMINOPHEN 5-325 MG PO TABS: 1.0000 | ORAL_TABLET | Freq: Four times a day (QID) | ORAL | 0 refills | Status: DC | PRN

## 2019-06-27 NOTE — NC FL2 (Signed)
[place] LEVEL OF CARE SCREENING TOOL     IDENTIFICATION  Patient Name: Jean Bowen Birthdate: [birth year] Sex: female Admission Date (Current Location): 06/25/2019  [facility] and [place] Number:  Herbalist and Address:         Provider Number: [id number]  Attending Physician Name and Address:  Georgette Shell, MD  Relative Name and Phone Number:       Current Level of Care: Hospital Recommended Level of Care: [place] Prior Approval Number:    Date Approved/Denied:   PASRR Number: [id number]  Discharge Plan: SNF    Current Diagnoses: Patient Active Problem List   Diagnosis Date Noted  . Multiple pelvic fractures ([place]) 06/26/2019  . Anemia 06/25/2019  . Pelvic fracture ([place]) 06/25/2019  . Hyperglycemia 03/19/2019  . Hyperparathyroidism ([place]) 02/12/2015  . Thrombocytopenia ([place]) 02/11/2015  . Dysphagia 03/02/2011  . HYPERCHOLESTEROLEMIA 02/23/2010  . Pruritus 01/08/2010  . ANGIOEDEMA 12/11/2009  . GERD 03/03/2007  . Hypothyroidism 12/03/2006  . 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 Essential hypertension 12/03/2006  . Osteoporosis 12/03/2006  . Overactive bladder 12/03/2006  . COLONIC POLYPS, HX OF 12/03/2006    Orientation RESPIRATION BLADDER Height & Weight     Self, Time, Situation, Place  Normal Continent Weight: 89.4 kg Height:  5\' 7"  (170.2 cm)  BEHAVIORAL SYMPTOMS/MOOD NEUROLOGICAL BOWEL NUTRITION STATUS      Continent Diet(regular)  AMBULATORY STATUS COMMUNICATION OF NEEDS Skin   Extensive Assist Verbally Normal                       Personal Care Assistance Level of Assistance  Bathing, Feeding, Dressing Bathing Assistance: Limited assistance Feeding assistance: Limited assistance Dressing Assistance: Limited assistance     Functional Limitations Info  Sight, Hearing, Speech Sight Info: Adequate Hearing Info: Adequate Speech Info: Adequate     SPECIAL CARE FACTORS FREQUENCY  PT (By licensed PT)     PT Frequency: 5xweekly              Contractures Contractures Info: Not present    Additional Factors Info  Code Status Code Status Info: dnr             Current Medications (06/27/2019):  This is the current hospital active medication list Current Facility-Administered Medications  Medication Dose Route Frequency Provider Last Rate Last Admin  . acyclovir (ZOVIRAX) tablet 800 mg  800 mg Oral Daily Doutova, Anastassia, MD   800 mg at 06/27/19 0914  . amLODipine (NORVASC) tablet 2.5 mg  2.5 mg Oral Daily Doutova, Anastassia, MD   2.5 mg at 06/27/19 0914  . cyanocobalamin ((VITAMIN B-12)) injection 1,000 mcg  1,000 mcg Intramuscular Daily Patrecia Pour, MD   1,000 mcg at 06/27/19 0913  . escitalopram (LEXAPRO) tablet 10 mg  10 mg Oral Daily Toy Baker, MD   10 mg at 06/27/19 0914  . fentaNYL (SUBLIMAZE) injection 50 mcg  50 mcg Intravenous Once Toy Baker, MD   Stopped at 06/25/19 1915  . HYDROcodone-acetaminophen (NORCO/VICODIN) 5-325 MG per tablet 1-2 tablet  1-2 tablet Oral Q6H PRN Toy Baker, MD   1 tablet at 06/27/19 0914  . HYDROmorphone (DILAUDID) injection 0.5 mg  0.5 mg Intravenous Q2H PRN Doutova, Anastassia, MD      . hydrOXYzine (ATARAX/VISTARIL) tablet 10 mg  10 mg Oral TID PRN Toy Baker, MD      . levothyroxine (SYNTHROID) tablet 25 mcg  25 mcg Oral  QAC breakfast Toy Baker, MD   25 mcg at 06/27/19 0530  . methocarbamol (ROBAXIN) tablet 500 mg  500 mg Oral Q6H PRN Toy Baker, MD   500 mg at 06/27/19 0913   Or  . methocarbamol (ROBAXIN) 500 mg in dextrose 5 % 50 mL IVPB  500 mg Intravenous Q6H PRN Doutova, Anastassia, MD      . oxybutynin (DITROPAN) tablet 5 mg  5 mg Oral Daily Doutova, Anastassia, MD   5 mg at 06/27/19 0915  . pantoprazole (PROTONIX) EC tablet 40 mg  40 mg Oral Daily Patrecia Pour, MD   40 mg at 06/27/19 0805     Discharge  Medications: Please see discharge summary for a list of discharge medications.  Relevant Imaging Results:  Relevant Lab Results:   Additional Information [id number]  Leeroy Cha, RN

## 2019-06-27 NOTE — Progress Notes (Signed)
Occupational Therapy Treatment Patient Details Name: Jean Bowen MRN: [id number] DOB: July 22, 1945 Today's Date: 06/27/2019    History of present illness Pt is a 74 year old female admitted after fall while trying to get packages on the porch and sustained left nondisplaced pubic ramus, nondisplaced anterior wall acetabular and nondisplaced sacral ala fractures   OT comments  Pt wants to private pay at [place].  Parthenia [place] if this is an option  Follow Up Recommendations  SNF    Equipment Recommendations  3 in 1 bedside commode    Recommendations for Other Services      Precautions / Restrictions Precautions Precautions: Fall Restrictions Weight Bearing Restrictions: No       Mobility Bed Mobility Overal bed mobility: Needs Assistance Bed Mobility: Supine to Sit     Supine to sit: Min guard        Transfers Overall transfer level: Needs assistance Equipment used: Rolling walker (2 wheeled) Transfers: Sit to/from [facility] Sit to Stand: Min assist Stand pivot transfers: Min assist       General transfer comment: verbal cues for UE positioning, required assist to rise and steady    Balance Overall balance assessment: Needs assistance Sitting-balance support: Single extremity supported Sitting balance-Leahy Scale: Good     Standing balance support: Bilateral upper extremity supported Standing balance-Leahy Scale: Poor                             ADL either performed or assessed with clinical judgement   ADL Overall ADL's : Needs assistance/impaired Eating/Feeding: Set up;Sitting   Grooming: Wash/dry hands;Wash/dry face;Sitting;Set up   Upper Body Bathing: Set up;Sitting   Lower Body Bathing: Sit to/from stand;Cueing for safety;Moderate assistance   Upper Body Dressing : Set up;Sitting   Lower Body Dressing: Sit to/from stand;Cueing for compensatory techniques;Cueing for safety;Cueing for sequencing;Moderate assistance    Toilet Transfer: Stand-pivot;Cueing for safety;Cueing for sequencing;Min guard   Toileting- Clothing Manipulation and Hygiene: Sit to/from stand;Min guard       Functional mobility during ADLs: Moderate assistance;Rolling walker General ADL Comments: increased time     Vision Patient Visual Report: No change from baseline     Perception     Praxis      Cognition Arousal/Alertness: Awake/alert Behavior During Therapy: WFL for tasks assessed/performed Overall Cognitive Status: Within Functional Limits for tasks assessed                                           Prior Functioning/Environment              Frequency  Min 2X/week         AM-PAC OT "6 Clicks" Daily Activity     Outcome Measure   Help from another person eating meals?: None Help from another person taking care of personal grooming?: None Help from another person toileting, which includes using toliet, bedpan, or urinal?: A Lot Help from another person bathing (including washing, rinsing, drying)?: A Little Help from another person to put on and taking off regular upper body clothing?: A Little Help from another person to put on and taking off regular lower body clothing?: A Little 6 Click Score: 19    End of Session Equipment Utilized During Treatment: Rolling walker  OT Visit Diagnosis: Unsteadiness on feet (R26.81);Other abnormalities of gait and mobility (R26.89);Muscle weakness (  generalized) (M62.81);History of falling (Z91.81)   Activity Tolerance Patient tolerated treatment well;Patient limited by pain   Patient Left in chair;with call bell/phone within reach   Nurse Communication Mobility status        Time: 1000-1035 OT Time Calculation (min): 35 min  Charges: OT General Charges $OT Visit: 1 Visit OT Treatments $Self Care/Home Management : 23-37 mins  Kari Baars, [place] [phone] Office- [phone], Edwena Felty D 06/27/2019, 2:37 PM

## 2019-06-27 NOTE — Progress Notes (Signed)
PROGRESS NOTE    Jean Bowen  [id number] DOB: [birth year] DOA: 06/25/2019 PCP: Vivi Barrack, MD  Brief Narrative: 74 y.o. female with a history of HFpEF, hypothyroidism, OSA, GERD, HTN, HLD, and depression who presented to the ED by EMS after falling at home, where she lives alone, with immediate severe left groin/hip pain found to have nondisplaced fractures of the left pubic ramus, acetabular, and sacral ala. Orthopedics was consulted, recommended PT, WBAT, and pain medication. She will require skilled nursing facility level of care for adequate rehabilitation.  Assessment & Plan:   Active Problems:   Hypothyroidism   HYPERCHOLESTEROLEMIA   Essential hypertension   GERD   Anemia   Pelvic fracture (HCC)   Multiple pelvic fractures ([place])  Fall at home with multiple pelvic fractures: Left nondisplaced pubic ramus, nondisplaced anterior wall acetabular and nondisplaced sacral ala fractures. - Nonoperative management per orthopedics, Dr. Doran Durand. WBAT LLE.  - Continue PT/OT - Continue analgesics as ordered  HTN:  - Continue norvasc  HLD: Patient reports not taking statin.  Hypothyroidism:  - Continue synthroid  GERD:  - Continue PPI  Depression:  - Continue lexapro  Vitamin B12 deficiency: No anemia/macrocytosis currently. No neuropathy noted.  - Supplement.   OAB:  - Continue oxybutynin.  Low hemoglobin level: Possibly a spurious result as hgb this AM was 14. No reports of bleeding.  Hemoglobin 14.0.  History of right frontal intraparenchymal hemorrhage: No neurological deficits. This was in 2016. No further work up is planned at this time.   Hypokalemia K was 3.4 yesterday recheck levels today with magnesium.  Estimated body mass index is 30.87 kg/m as calculated from the following:   Height as of this encounter: 5\' 7"  (1.702 m).   Weight as of this encounter: 89.4 kg.  DVT prophylaxis: SCDs Code Status: DNR Family Communication: None at  bedside Disposition Plan: SNF  Consultants:   Orthopedics  Procedures:   None  Antimicrobials:  None   Subjective: She is resting in bed reports pain with movement No nausea vomiting Had a bowel movement 2 days ago Has not had much to eat  Objective: Vitals:   06/26/19 2059 06/27/19 0627 06/27/19 0742 06/27/19 1429  BP: (!) 169/109 (!) 172/104 (!) 155/89 126/78  Pulse: 82 76 74 84  Resp: 15 16 16 16   Temp: 99 F (37.2 C) 98.5 F (36.9 C) 98.4 F (36.9 C)   TempSrc: Oral Oral Oral   SpO2: 94% 91% 92% 93%  Weight:      Height:        Intake/Output Summary (Last 24 hours) at 06/27/2019 1544 Last data filed at 06/27/2019 1429 Gross per 24 hour  Intake 1440 ml  Output 550 ml  Net 890 ml   Filed Weights   06/25/19 2309  Weight: 89.4 kg    Examination:  General exam: Appears calm and comfortable  Respiratory system: Clear to auscultation. Respiratory effort normal. Cardiovascular system: S1 & S2 heard, RRR. No JVD, murmurs, rubs, gallops or clicks. No pedal edema. Gastrointestinal system: Abdomen is nondistended, soft and nontender. No organomegaly or masses felt. Normal bowel sounds heard. Central nervous system: Alert and oriented. No focal neurological deficits. Extremities: Symmetric 5 x 5 power. Skin: No rashes, lesions or ulcers Psychiatry: Judgement and insight appear normal. Mood & affect appropriate.     Data Reviewed: I have personally reviewed following labs and imaging studies  CBC: Recent Labs  Lab 06/25/19 1452 06/26/19 0312  WBC 8.0 6.1  NEUTROABS 6.1  --  HGB 10.1* 14.0  HCT 32.0* 43.6  MCV 97.6 94.8  PLT 138* [id number]   Basic Metabolic Panel: Recent Labs  Lab 06/25/19 1452 06/26/19 0312  NA 139 139  K 4.6 3.4*  CL 103 103  CO2 28 28  GLUCOSE 94 112*  BUN 13 11  CREATININE 0.63 0.51  CALCIUM 8.5* 8.8*   GFR: Estimated Creatinine Clearance: 70.8 mL/min (by C-G formula based on SCr of 0.51 mg/dL). Liver Function  Tests: No results for input(s): AST, ALT, ALKPHOS, BILITOT, PROT, ALBUMIN in the last 168 hours. No results for input(s): LIPASE, AMYLASE in the last 168 hours. No results for input(s): AMMONIA in the last 168 hours. Coagulation Profile: No results for input(s): INR, PROTIME in the last 168 hours. Cardiac Enzymes: No results for input(s): CKTOTAL, CKMB, CKMBINDEX, TROPONINI in the last 168 hours. BNP (last 3 results) No results for input(s): PROBNP in the last 8760 hours. HbA1C: No results for input(s): HGBA1C in the last 72 hours. CBG: No results for input(s): GLUCAP in the last 168 hours. Lipid Profile: No results for input(s): CHOL, HDL, LDLCALC, TRIG, CHOLHDL, LDLDIRECT in the last 72 hours. Thyroid Function Tests: No results for input(s): TSH, T4TOTAL, FREET4, T3FREE, THYROIDAB in the last 72 hours. Anemia Panel: Recent Labs    06/26/19 0312  VITAMINB12 161*  FOLATE 10.0  FERRITIN 68  TIBC 362  IRON 78  RETICCTPCT 1.5   Sepsis Labs: No results for input(s): PROCALCITON, LATICACIDVEN in the last 168 hours.  Recent Results (from the past 240 hour(s))  SARS CORONAVIRUS 2 (TAT 6-24 HRS) Nasopharyngeal Nasopharyngeal Swab     Status: None   Collection Time: 06/25/19  6:23 PM   Specimen: Nasopharyngeal Swab  Result Value Ref Range Status   SARS Coronavirus 2 NEGATIVE NEGATIVE Final    Comment: (NOTE) SARS-CoV-2 target nucleic acids are NOT DETECTED. The SARS-CoV-2 RNA is generally detectable in upper and lower respiratory specimens during the acute phase of infection. Negative results do not preclude SARS-CoV-2 infection, do not rule out co-infections with other pathogens, and should not be used as the sole basis for treatment or other patient management decisions. Negative results must be combined with clinical observations, patient history, and epidemiological information. The expected result is Negative. Fact Sheet for  Patients: SugarRoll.be Fact Sheet for Healthcare Providers: https://www.[facility] [facility].com/ This test is not yet approved or cleared by the [place] FDA and  has been authorized for detection and/or diagnosis of SARS-CoV-2 by FDA under an Emergency Use Authorization (EUA). This EUA will remain  in effect (meaning this test can be used) for the duration of the COVID-19 declaration under Section 56 4(b)(1) of the Act, 21 U.S.C. section 360bbb-3(b)(1), unless the authorization is terminated or revoked sooner. Performed at [facility], [place] [address] [place], [place] [address]          Radiology Studies: CT PELVIS WO CONTRAST  Result Date: 06/25/2019 CLINICAL DATA:  Left hip pain after a fall EXAM: CT PELVIS WITHOUT CONTRAST TECHNIQUE: Multidetector CT imaging of the pelvis was performed following the standard protocol without intravenous contrast. COMPARISON:  Radiographs from 06/25/2019 FINDINGS: Urinary Tract:  Urinary bladder unremarkable. Bowel:  Sigmoid colon diverticulosis. Vascular/Lymphatic: Aortoiliac atherosclerotic vascular disease. Reproductive:  Unremarkable Other:  No supplemental non-categorized findings. Musculoskeletal: Nondisplaced acute fracture of the left anterior acetabular wall. Nondisplaced acute fracture of the left inferior pubic ramus. Subtle nondisplaced fracture the left sacral ala. Questionable transverse component of S3-4. Degenerative facet arthropathy at L5-S1 on  the right. No fracture of the left proximal femur is identified. Mild spurring of the right femoral head. IMPRESSION: 1. Nondisplaced acute fractures of the left anterior acetabular wall, left inferior pubic ramus, and left sacral ala. 2. Sigmoid colon diverticulosis. 3. Aortoiliac atherosclerotic vascular disease. 4. Degenerative facet arthropathy at L5-S1 on the right. Aortic Atherosclerosis (ICD10-I70.0). Electronically Signed   By: Van Clines  M.D.   On: 06/25/2019 17:07        Scheduled Meds: . acyclovir  800 mg Oral Daily  . amLODipine  2.5 mg Oral Daily  . cyanocobalamin  1,000 mcg Intramuscular Daily  . escitalopram  10 mg Oral Daily  . fentaNYL (SUBLIMAZE) injection  50 mcg Intravenous Once  . levothyroxine  25 mcg Oral QAC breakfast  . oxybutynin  5 mg Oral Daily  . pantoprazole  40 mg Oral Daily   Continuous Infusions: . methocarbamol (ROBAXIN) IV       LOS: 1 day     Georgette Shell, MD 06/27/2019, 3:44 PM

## 2019-06-27 NOTE — Progress Notes (Signed)
Physical Therapy Treatment Patient Details Name: Jean Bowen MRN: [id number] DOB: 25-May-1945 Today's Date: 06/27/2019    History of Present Illness Pt is a 74 year old female admitted after fall while trying to get packages on the porch and sustained left nondisplaced pubic ramus, nondisplaced anterior wall acetabular and nondisplaced sacral ala fractures    PT Comments    Pt on BSC on arrival.  Pt assisted with ambulating short distance in hallway and continues to be limited by pain during mobility.  Pt uncertain of d/c plan at this time however continues to mention possibly [facility].   Follow Up Recommendations  Supervision/Assistance - 24 hour;SNF(recommending 24/7 assist, pt looking into resources since she lives alone)     Equipment Recommendations  None recommended by PT    Recommendations for Other Services       Precautions / Restrictions Precautions Precautions: Fall Restrictions Weight Bearing Restrictions: No    Mobility  Bed Mobility Overal bed mobility: Needs Assistance Bed Mobility: Supine to Sit     Supine to sit: Min guard     General bed mobility comments: pt on BSC on arrival and left in recliner  Transfers Overall transfer level: Needs assistance Equipment used: Rolling walker (2 wheeled) Transfers: Sit to/from Stand Sit to Stand: Min assist Stand pivot transfers: Min assist       General transfer comment: verbal cues for UE positioning, required assist to rise and steady  Ambulation/Gait Ambulation/Gait assistance: Min assist Gait Distance (Feet): 39 Feet Assistive device: Rolling walker (2 wheeled) Gait Pattern/deviations: Step-to pattern;Antalgic;Decreased stance time - left     General Gait Details: verbal cues for sequence, RW positioning, step length; pt encouraged to use UEs to weight bear on RW for better pain control of L LE.  distance limited by pain 8/10   Stairs             Wheelchair Mobility    Modified Rankin  (Stroke Patients Only)       Balance Overall balance assessment: Needs assistance Sitting-balance support: Single extremity supported Sitting balance-Leahy Scale: Good     Standing balance support: Bilateral upper extremity supported Standing balance-Leahy Scale: Poor                              Cognition Arousal/Alertness: Awake/alert Behavior During Therapy: WFL for tasks assessed/performed Overall Cognitive Status: Within Functional Limits for tasks assessed                                        Exercises      General Comments        Pertinent Vitals/Pain Pain Assessment: 0-10 Pain Score: 8  Pain Location: L groin area Pain Descriptors / Indicators: Jabbing;Guarding;Sore Pain Intervention(s): Monitored during session;Repositioned    Home Living                      Prior Function            PT Goals (current goals can now be found in the care plan section) Progress towards PT goals: Progressing toward goals    Frequency    Min 3X/week      PT Plan Current plan remains appropriate    Co-evaluation              AM-PAC PT "6 Clicks" Mobility  Outcome Measure  Help needed turning from your back to your side while in a flat bed without using bedrails?: A Little Help needed moving from lying on your back to sitting on the side of a flat bed without using bedrails?: A Little Help needed moving to and from a bed to a chair (including a wheelchair)?: A Little Help needed standing up from a chair using your arms (e.g., wheelchair or bedside chair)?: A Little Help needed to walk in hospital room?: A Little Help needed climbing 3-5 steps with a railing? : A Lot 6 Click Score: 17    End of Session Equipment Utilized During Treatment: Gait belt Activity Tolerance: Patient limited by pain Patient left: in chair;with call bell/phone within reach   PT Visit Diagnosis: Difficulty in walking, not elsewhere  classified (R26.2)     Time: [id number] PT Time Calculation (min) (ACUTE ONLY): 20 min  Charges:  $Gait Training: 8-22 mins                     Arlyce Dice, DPT Acute Rehabilitation Services Office: [place] [facility] 06/27/2019, 3:28 PM

## 2019-06-27 NOTE — TOC Initial Note (Addendum)
Transition of Care [facility]) - Initial/Assessment Note    Patient Details  Name: Jean Bowen MRN: [id number] Date of Birth: May 19, 1945  [facility]) CM/SW Contact:    Jean Cha, RN Phone Number: 06/27/2019, 10:00 AM  Clinical Narrative:                 fl2 sent to [facility] per patient request. tct-donna at [facility]/messagel eft to please return call. tct-donna no return call at1245pm message left to please return call. tcf-donna we only take from our residents at this time. Without being pvt.pay.  Discussed with patient wants to pvt pay/donna notified. tct-[facility]/will have bed in the am. Patient and md notified.  Patient is agreeable. tcf-[facility]na at [facility] can offer bed if she is willing to pay privately at $409.00 a day/tct-patient is aware of cost and wants to go to [facility]. Butch Penny at [facility] is aware. Expected Discharge Plan: Skilled Nursing Facility Barriers to Discharge: SNF Pending bed offer   Patient Goals and CMS Choice Patient states their goals for this hospitalization and ongoing recovery are:: to go home after rehab CMS Medicare.gov Compare Post Acute Care list provided to:: Patient Choice offered to / list presented to : Patient  Expected Discharge Plan and Services Expected Discharge Plan: [place]   Discharge Planning Services: CM Consult Post Acute Care Choice: [place] Living arrangements for the past 2 months: Single Family Home                                      Prior Living Arrangements/Services Living arrangements for the past 2 months: Single Family Home Lives with:: Self Patient language and need for interpreter reviewed:: No Do you feel safe going back to the place where you live?: Yes      Need for Family Participation in Patient Care: Yes (Comment) Care giver support system in place?: Yes (comment)   Criminal Activity/Legal Involvement Pertinent to Current  Situation/Hospitalization: No - Comment as needed  Activities of Daily Living Home Assistive Devices/Equipment: Eyeglasses ADL Screening (condition at time of admission) Patient's cognitive ability adequate to safely complete daily activities?: Yes Is the patient deaf or have difficulty hearing?: No Does the patient have difficulty seeing, even when wearing glasses/contacts?: No Does the patient have difficulty concentrating, remembering, or making decisions?: No Patient able to express need for assistance with ADLs?: Yes Does the patient have difficulty dressing or bathing?: No Independently performs ADLs?: Yes (appropriate for developmental age) Does the patient have difficulty walking or climbing stairs?: Yes Weakness of Legs: Left Weakness of Arms/Hands: None  Permission Sought/Granted                  Emotional Assessment Appearance:: Appears stated age     Orientation: : Oriented to Self, Oriented to Place, Oriented to  Time, Oriented to Situation Alcohol / Substance Use: Not Applicable Psych Involvement: No (comment)  Admission diagnosis:  Pelvic fracture ([place]) [S32.9XXA] Multiple pelvic fractures ([place]) [S32.82XA] Patient Active Problem List   Diagnosis Date Noted  . Multiple pelvic fractures ([place]) 06/26/2019  . Anemia 06/25/2019  . Pelvic fracture ([place]) 06/25/2019  . Hyperglycemia 03/19/2019  . Hyperparathyroidism ([place]) 02/12/2015  . Thrombocytopenia ([place]) 02/11/2015  . Dysphagia 03/02/2011  . HYPERCHOLESTEROLEMIA 02/23/2010  . Pruritus 01/08/2010  . ANGIOEDEMA 12/11/2009  . GERD 03/03/2007  . Hypothyroidism 12/03/2006  . Major depressive disorder, single episode, in  remission ([place]) 12/03/2006  . Essential hypertension 12/03/2006  . Osteoporosis 12/03/2006  . Overactive bladder 12/03/2006  . COLONIC POLYPS, HX OF 12/03/2006   PCP:  Vivi Barrack, MD Pharmacy:   [place], [place] - [address] [place] [address] Phone: [phone] Fax: [place] [address] [place] [address] Phone: [phone] Fax: [phone]     Social Determinants of Health (SDOH) Interventions    Readmission Risk Interventions No flowsheet data found.

## 2019-06-27 NOTE — Progress Notes (Signed)
Subjective: Spoke with pt by phone to check on her.  She reported being able to sit up comfortably.  She walked 33' with therapy with pain in the left hip.  She is pending discharge to snf.  SHe wants to go to [facility] and is familiar with the facility after her husband was there last year.    Objective: Vital signs in last 24 hours: Temp:  [98.4 F (36.9 C)-99 F (37.2 C)] 98.4 F (36.9 C) (03/10 0742) Pulse Rate:  [74-83] 74 (03/10 0742) Resp:  [15-16] 16 (03/10 0742) BP: (140-172)/(85-109) 155/89 (03/10 0742) SpO2:  [91 %-94 %] 92 % (03/10 0742)  Intake/Output from previous day: 03/09 0701 - 03/10 0700 In: 960 [P.O.:960] Out: 1150 [Urine:1150] Intake/Output this shift: Total I/O In: 240 [P.O.:240] Out: -   Recent Labs    06/25/19 1452 06/26/19 0312  HGB 10.1* 14.0   Recent Labs    06/25/19 1452 06/26/19 0312  WBC 8.0 6.1  RBC 3.28* 4.60  4.46  HCT 32.0* 43.6  PLT 138* 209   Recent Labs    06/25/19 1452 06/26/19 0312  NA 139 139  K 4.6 3.4*  CL 103 103  CO2 28 28  BUN 13 11  CREATININE 0.63 0.51  GLUCOSE 94 112*  CALCIUM 8.5* 8.8*   No results for input(s): LABPT, INR in the last 72 hours.      Assessment/Plan: L acetabulum, pubic ramus and sacral nondisplaced fractures - continue WBAT on L LE with walker or other assistive device per PT.  F/u in the office in two weeks for repeat xrays.  Ortho signing off.  Please call [phone] with any questions.    Wylene Simmer 06/27/2019, 11:47 AM

## 2019-06-28 ENCOUNTER — Other Ambulatory Visit: Payer: Self-pay | Admitting: Adult Health

## 2019-06-28 ENCOUNTER — Telehealth: Payer: Self-pay | Admitting: Family Medicine

## 2019-06-28 DIAGNOSIS — S32810A Multiple fractures of pelvis with stable disruption of pelvic ring, initial encounter for closed fracture: Secondary | ICD-10-CM

## 2019-06-28 LAB — CBC
HCT: 41.9 % (ref 36.0–46.0)
Hemoglobin: 13.6 g/dL (ref 12.0–15.0)
MCH: 30.8 pg (ref 26.0–34.0)
MCHC: 32.5 g/dL (ref 30.0–36.0)
MCV: 94.8 fL (ref 80.0–100.0)
Platelets: 188 10*3/uL (ref 150–400)
RBC: 4.42 MIL/uL (ref 3.87–5.11)
RDW: 13.4 % (ref 11.5–15.5)
WBC: 6.6 10*3/uL (ref 4.0–10.5)
nRBC: 0 % (ref 0.0–0.2)

## 2019-06-28 MED ORDER — METHOCARBAMOL 500 MG PO TABS: 500.0000 mg | ORAL_TABLET | Freq: Four times a day (QID) | ORAL | 0 refills | Status: DC | PRN

## 2019-06-28 MED ORDER — HYDROCODONE-ACETAMINOPHEN 5-325 MG PO TABS: 1.0000 | ORAL_TABLET | Freq: Four times a day (QID) | ORAL | 0 refills | Status: DC | PRN

## 2019-06-28 NOTE — Progress Notes (Signed)
Physical Therapy Treatment Patient Details Name: Jean Bowen MRN: [id number] DOB: Jan 25, 1946 Today's Date: 06/28/2019    History of Present Illness Pt is a 74 year old female admitted after fall while trying to get packages on the porch and sustained left nondisplaced pubic ramus, nondisplaced anterior wall acetabular and nondisplaced sacral ala fractures    PT Comments    Pt ambulated in hallway and reports pain less today with mobility!  Pt plans to d/c to [facility] today.    Follow Up Recommendations  Supervision/Assistance - 24 hour;SNF     Equipment Recommendations  None recommended by PT    Recommendations for Other Services       Precautions / Restrictions Precautions Precautions: Fall    Mobility  Bed Mobility               General bed mobility comments: pt in recliner  Transfers Overall transfer level: Needs assistance Equipment used: Rolling walker (2 wheeled) Transfers: Sit to/from Stand Sit to Stand: Min guard         General transfer comment: verbal cues for UE positioning, min/guard for safety  Ambulation/Gait Ambulation/Gait assistance: Min guard Gait Distance (Feet): 39 Feet Assistive device: Rolling walker (2 wheeled) Gait Pattern/deviations: Step-to pattern;Antalgic;Decreased stance time - left     General Gait Details: verbal cues for posture, RW positioning, step length; pt reports pain improved to 6/10 today   Stairs             Wheelchair Mobility    Modified Rankin (Stroke Patients Only)       Balance           Standing balance support: Bilateral upper extremity supported Standing balance-Leahy Scale: Poor Standing balance comment: reliant on UE support at this time                            Cognition Arousal/Alertness: Awake/alert Behavior During Therapy: WFL for tasks assessed/performed Overall Cognitive Status: Within Functional Limits for tasks assessed                                         Exercises      General Comments        Pertinent Vitals/Pain Pain Assessment: 0-10 Pain Score: 6  Pain Location: L groin area Pain Descriptors / Indicators: Jabbing;Guarding;Sore Pain Intervention(s): Repositioned;Monitored during session    Home Living                      Prior Function            PT Goals (current goals can now be found in the care plan section) Progress towards PT goals: Progressing toward goals    Frequency    Min 3X/week      PT Plan Current plan remains appropriate    Co-evaluation              AM-PAC PT "6 Clicks" Mobility   Outcome Measure  Help needed turning from your back to your side while in a flat bed without using bedrails?: A Little Help needed moving from lying on your back to sitting on the side of a flat bed without using bedrails?: A Little Help needed moving to and from a bed to a chair (including a wheelchair)?: A Little Help needed standing up from a chair using your arms (  e.g., wheelchair or bedside chair)?: A Little Help needed to walk in hospital room?: A Little Help needed climbing 3-5 steps with a railing? : A Lot 6 Click Score: 17    End of Session   Activity Tolerance: Patient tolerated treatment well Patient left: in chair;with call bell/phone within reach   PT Visit Diagnosis: Difficulty in walking, not elsewhere classified (R26.2)     Time: 1059-1110 PT Time Calculation (min) (ACUTE ONLY): 11 min  Charges:  $Gait Training: 8-22 mins                    Arlyce Dice, DPT Acute Rehabilitation Services Office: [phone]  Trena Platt 06/28/2019, 12:17 PM

## 2019-06-28 NOTE — Plan of Care (Signed)
Patient discharged to [facility] with PTAR. Left in stable condition

## 2019-06-28 NOTE — Telephone Encounter (Signed)
[facility] is calling in saying the patient has moved in with them, and asked for the last physical notes along with the last office notes. [phone]

## 2019-06-28 NOTE — TOC Progression Note (Addendum)
Transition of Care [facility]) - Progression Note    Patient Details  Name: Jean Bowen MRN: [id number] Date of Birth: 29-Jul-1945  Transition of Care [facility]) CM/SW Contact  Leeroy Cha, RN Phone Number: 06/28/2019, 10:41 AM  Clinical Narrative:    Patient to go to [facility] at 12pm today. ptar called at 1046 for transport at 12pm. Call main numbjer for report. Floor rn made aware' Transport packet to floor. Chart checked for prescriptions and papers none found. Expected Discharge Plan: [place] Barriers to Discharge: No Barriers Identified  Expected Discharge Plan and Services Expected Discharge Plan: [place]   Discharge Planning Services: CM Consult Post Acute Care Choice: [place] Living arrangements for the past 2 months: Single Family Home Expected Discharge Date: 06/28/19                                     Social Determinants of Health (SDOH) Interventions    Readmission Risk Interventions No flowsheet data found.

## 2019-06-28 NOTE — Progress Notes (Signed)
Occupational Therapy Treatment Patient Details Name: Jean Bowen MRN: [id number] DOB: 1946-03-21 Today's Date: 06/28/2019    History of present illness Pt is a 74 year old female admitted after fall while trying to get packages on the porch and sustained left nondisplaced pubic ramus, nondisplaced anterior wall acetabular and nondisplaced sacral ala fractures   OT comments  Pt plans to private pay at [facility]  Follow Up Recommendations  SNF    Equipment Recommendations  3 in 1 bedside commode    Recommendations for Other Services      Precautions / Restrictions Precautions Precautions: Fall       Mobility Bed Mobility Overal bed mobility: Needs Assistance Bed Mobility: Supine to Sit     Supine to sit: Min guard     General bed mobility comments: pt in recliner  Transfers Overall transfer level: Needs assistance Equipment used: Rolling walker (2 wheeled) Transfers: Sit to/from [facility] Sit to Stand: Min assist Stand pivot transfers: Min assist       General transfer comment: verbal cues for UE positioning, required assist to rise and steady    Balance Overall balance assessment: Needs assistance Sitting-balance support: Single extremity supported Sitting balance-Leahy Scale: Good     Standing balance support: Bilateral upper extremity supported Standing balance-Leahy Scale: Poor Standing balance comment: reliant on UE support at this time                           ADL either performed or assessed with clinical judgement   ADL Overall ADL's : Needs assistance/impaired Eating/Feeding: Set up;Sitting   Grooming: Wash/dry hands;Wash/dry face;Sitting;Set up   Upper Body Bathing: Set up;Sitting   Lower Body Bathing: Sit to/from stand;Cueing for safety;Minimal assistance   Upper Body Dressing : Set up;Sitting   Lower Body Dressing: Sit to/from stand;Cueing for compensatory techniques;Cueing for safety;Cueing for  sequencing;Minimal assistance   Toilet Transfer: Stand-pivot;Cueing for safety;Cueing for sequencing;Min guard   Toileting- Clothing Manipulation and Hygiene: Sit to/from stand;Min guard       Functional mobility during ADLs: Moderate assistance;Rolling walker General ADL Comments: increased time     Vision Patient Visual Report: No change from baseline            Cognition Arousal/Alertness: Awake/alert Behavior During Therapy: WFL for tasks assessed/performed Overall Cognitive Status: Within Functional Limits for tasks assessed                                                     Pertinent Vitals/ Pain       Pain Assessment: 0-10 Pain Score: 2  Pain Location: L groin area Pain Descriptors / Indicators: Dull;Discomfort Pain Intervention(s): Repositioned;Monitored during session         Frequency  Min 2X/week         AM-PAC OT "6 Clicks" Daily Activity     Outcome Measure   Help from another person eating meals?: None Help from another person taking care of personal grooming?: None Help from another person toileting, which includes using toliet, bedpan, or urinal?: A Lot Help from another person bathing (including washing, rinsing, drying)?: A Little Help from another person to put on and taking off regular upper body clothing?: A Little Help from another person to put on and taking off regular lower body clothing?: A Little  6 Click Score: 19    End of Session Equipment Utilized During Treatment: Rolling walker  OT Visit Diagnosis: Unsteadiness on feet (R26.81);Other abnormalities of gait and mobility (R26.89);Muscle weakness (generalized) (M62.81);History of falling (Z91.81)   Activity Tolerance Patient tolerated treatment well;Patient limited by pain   Patient Left in chair;with call bell/phone within reach   Nurse Communication Mobility status        Time: [id number] OT Time Calculation (min): 30 min  Charges: OT General  Charges $OT Visit: 1 Visit OT Treatments $Self Care/Home Management : 23-37 mins  Kari Baars, [place] [phone] Office- [phone]      Dawson, Edwena Felty D 06/28/2019, 2:52 PM

## 2019-06-28 NOTE — Discharge Summary (Signed)
Physician Discharge Summary  Jean Bowen [id number] DOB: [birth year] DOA: 06/25/2019  PCP: Vivi Barrack, MD  Admit date: 06/25/2019 Discharge date: 06/28/2019  Admitted From: Home Disposition: Nursing home Recommendations for Outpatient Follow-up:  1. Follow up with PCP and Dr. Doran Durand  in 1-2 weeks 2. Please obtain BMP/CBC in one week    Home Health: None Equipment/Devices: None  Discharge Condition stable and improved CODE STATUS: DNR Diet recommendation: Cardiac diet Brief/Interim Summary: 74 y.o.femalewith a history of HFpEF, hypothyroidism, OSA, GERD, HTN, HLD, and depression who presented to the ED by EMS after falling at home, where she lives alone, with immediate severe left groin/hip pain found to have nondisplaced fractures of the left pubic ramus, acetabular, and sacral ala. Orthopedics was consulted, recommended PT, WBAT, and pain medication. She will require skilled nursing facility level of care for adequate rehabilitation.   Discharge Diagnoses:  Active Problems:   Hypothyroidism   HYPERCHOLESTEROLEMIA   Essential hypertension   GERD   Anemia   Pelvic fracture (HCC)   Multiple pelvic fractures ([place])  Fall at home with multiple pelvic fractures:Left nondisplaced pubic ramus, nondisplaced anterior wall acetabular and nondisplaced sacral ala fractures. - Nonoperative management per orthopedics, Dr. Doran Durand. WBAT LLE.   HTN:  - Continue norvasc  HLD: Patient reports not taking statin.  Hypothyroidism:  - Continue synthroid  GERD:  - Continue PPI  Depression:  - Continue lexapro  Vitamin B12 deficiency: No anemia/macrocytosis currently. No neuropathy noted.  - Supplement.   OAB:  - Continue oxybutynin.  Low hemoglobin level: Possibly a spurious result as hgb this AM was 14. No reports of bleeding.  Hemoglobin 14.0.  History of right frontal intraparenchymal hemorrhage: No neurological deficits. This was in 2016. No further work up is  planned at this time.  Hypokalemia resolved    Estimated body mass index is 30.87 kg/m as calculated from the following:   Height as of this encounter: 5\' 7"  (1.702 m).   Weight as of this encounter: 89.4 kg.  Discharge Instructions  Discharge Instructions    Call MD for:  persistant nausea and vomiting   Complete by: As directed    Call MD for:  severe uncontrolled pain   Complete by: As directed    Diet - low sodium heart healthy   Complete by: As directed    Increase activity slowly   Complete by: As directed    Weight bearing as tolerated   Complete by: As directed    Laterality: left   Extremity: Lower     Allergies as of 06/28/2019      Reactions   Doxycycline    REACTION: swollen lips and throat   Losartan Potassium    REACTION: ? of angioedema   Morphine And Related    Caused vomiting when given too fast in IV.      Medication List    STOP taking these medications   atorvastatin 10 MG tablet Commonly known as: LIPITOR   diclofenac 75 MG EC tablet Commonly known as: VOLTAREN   ibuprofen 100 MG/5ML suspension Commonly known as: ADVIL     TAKE these medications   acyclovir 400 MG tablet Commonly known as: ZOVIRAX TAKE TWO TABLETS BY MOUTH EVERY 4 HOURS WHILE AWAKE What changed: See the new instructions.   amLODipine 2.5 MG tablet Commonly known as: NORVASC Take 1 tablet (2.5 mg total) by mouth daily.   aspirin EC 81 MG tablet Take 1 tablet (81 mg total) by mouth 2 (two) times  daily.   escitalopram 10 MG tablet Commonly known as: LEXAPRO Take 1 tablet (10 mg total) by mouth daily.   HYDROcodone-acetaminophen 5-325 MG tablet Commonly known as: NORCO/VICODIN Take 1 tablet by mouth every 6 (six) hours as needed for up to 5 days for severe pain.   hydrOXYzine 10 MG tablet Commonly known as: ATARAX/VISTARIL Take 1 tablet (10 mg total) by mouth 3 (three) times daily as needed for itching.   levothyroxine 25 MCG tablet Commonly known as:  SYNTHROID Take 1 tablet (25 mcg total) by mouth daily before breakfast.   methocarbamol 500 MG tablet Commonly known as: ROBAXIN Take 1 tablet (500 mg total) by mouth every 6 (six) hours as needed for muscle spasms.   omeprazole 40 MG capsule Commonly known as: PRILOSEC Take 1 capsule (40 mg total) by mouth daily.   oxybutynin 5 MG tablet Commonly known as: DITROPAN Take 1 tablet (5 mg total) by mouth 3 (three) times daily. What changed: when to take this   valACYclovir 1000 MG tablet Commonly known as: Valtrex 2 pills at outbreak onset repeat in 12h for each outbreak What changed:   how much to take  how to take this  when to take this  reasons to take this            Discharge Care Instructions  (From admission, onward)         Start     Ordered   06/27/19 0000  Weight bearing as tolerated    Question Answer Comment  Laterality left   Extremity Lower      06/27/19 1204         Follow-up Information    Wylene Simmer, MD. Schedule an appointment as soon as possible for a visit in 1 month(s).   Specialty: Orthopedic Surgery Contact information: [address] [place] [address] [phone]        Vivi Barrack, MD Follow up.   Specialty: Family Medicine Contact information: [place] [address] [phone]          Allergies  Allergen Reactions  . Doxycycline     REACTION: swollen lips and throat  . Losartan Potassium     REACTION: ? of angioedema  . Morphine And Related     Caused vomiting when given too fast in IV.    Consultations:  Ortho    Procedures/Studies: CT PELVIS WO CONTRAST  Result Date: 06/25/2019 CLINICAL DATA:  Left hip pain after a fall EXAM: CT PELVIS WITHOUT CONTRAST TECHNIQUE: Multidetector CT imaging of the pelvis was performed following the standard protocol without intravenous contrast. COMPARISON:  Radiographs from 06/25/2019 FINDINGS: Urinary Tract:  Urinary bladder  unremarkable. Bowel:  Sigmoid colon diverticulosis. Vascular/Lymphatic: Aortoiliac atherosclerotic vascular disease. Reproductive:  Unremarkable Other:  No supplemental non-categorized findings. Musculoskeletal: Nondisplaced acute fracture of the left anterior acetabular wall. Nondisplaced acute fracture of the left inferior pubic ramus. Subtle nondisplaced fracture the left sacral ala. Questionable transverse component of S3-4. Degenerative facet arthropathy at L5-S1 on the right. No fracture of the left proximal femur is identified. Mild spurring of the right femoral head. IMPRESSION: 1. Nondisplaced acute fractures of the left anterior acetabular wall, left inferior pubic ramus, and left sacral ala. 2. Sigmoid colon diverticulosis. 3. Aortoiliac atherosclerotic vascular disease. 4. Degenerative facet arthropathy at L5-S1 on the right. Aortic Atherosclerosis (ICD10-I70.0). Electronically Signed   By: Van Clines M.D.   On: 06/25/2019 17:07   DG Hand Complete Left  Result Date:  06/11/2019 CLINICAL DATA:  Left hand pain, swelling, fall EXAM: LEFT HAND - COMPLETE 3+ VIEW COMPARISON:  None. FINDINGS: Age indeterminate lucency/possible fracture at the radial styloid. No subluxation. No other discrete lucencies are visualized. Joint space narrowing at the IP joints. IMPRESSION: 1. No definite acute osseous abnormality at the fourth digit 2. Possible age indeterminate fracture involving the radial styloid, correlate for point tenderness. Electronically Signed   By: Donavan Foil M.D.   On: 06/11/2019 18:15   DG Hip Unilat W or Wo Pelvis 2-3 Views Left  Result Date: 06/25/2019 CLINICAL DATA:  Left hip pain after a fall this morning. Initial encounter. EXAM: DG HIP (WITH OR WITHOUT PELVIS) 2-3V LEFT COMPARISON:  None. FINDINGS: There is no evidence of hip fracture or dislocation. There is no evidence of arthropathy or other focal bone abnormality. IMPRESSION: Negative exam. Electronically Signed   By: Inge Rise M.D.   On: 06/25/2019 13:51    (Echo, Carotid, EGD, Colonoscopy, ERCP)    Subjective: Anxious to go to rehab  Discharge Exam: Vitals:   06/27/19 2157 06/28/19 0542  BP: (!) 157/88 (!) 159/88  Pulse: 93 79  Resp: 18 18  Temp: 99.1 F (37.3 C) 98.4 F (36.9 C)  SpO2: 94% 95%   Vitals:   06/27/19 0742 06/27/19 1429 06/27/19 2157 06/28/19 0542  BP: (!) 155/89 126/78 (!) 157/88 (!) 159/88  Pulse: 74 84 93 79  Resp: 16 16 18 18   Temp: 98.4 F (36.9 C)  99.1 F (37.3 C) 98.4 F (36.9 C)  TempSrc: Oral  Oral Oral  SpO2: 92% 93% 94% 95%  Weight:      Height:        General: Pt is alert, awake, not in acute distress Cardiovascular: RRR, S1/S2 +, no rubs, no gallops Respiratory: CTA bilaterally, no wheezing, no rhonchi Abdominal: Soft, NT, ND, bowel sounds + Extremities: no edema, no cyanosis    The results of significant diagnostics from this hospitalization (including imaging, microbiology, ancillary and laboratory) are listed below for reference.     Microbiology: Recent Results (from the past 240 hour(s))  SARS CORONAVIRUS 2 (TAT 6-24 HRS) Nasopharyngeal Nasopharyngeal Swab     Status: None   Collection Time: 06/25/19  6:23 PM   Specimen: Nasopharyngeal Swab  Result Value Ref Range Status   SARS Coronavirus 2 NEGATIVE NEGATIVE Final    Comment: (NOTE) SARS-CoV-2 target nucleic acids are NOT DETECTED. The SARS-CoV-2 RNA is generally detectable in upper and lower respiratory specimens during the acute phase of infection. Negative results do not preclude SARS-CoV-2 infection, do not rule out co-infections with other pathogens, and should not be used as the sole basis for treatment or other patient management decisions. Negative results must be combined with clinical observations, patient history, and epidemiological information. The expected result is Negative. Fact Sheet for Patients: SugarRoll.be Fact Sheet for Healthcare  Providers: https://www.[facility] [place].com/ This test is not yet approved or cleared by the [place] FDA and  has been authorized for detection and/or diagnosis of SARS-CoV-2 by FDA under an Emergency Use Authorization (EUA). This EUA will remain  in effect (meaning this test can be used) for the duration of the COVID-19 declaration under Section 56 4(b)(1) of the Act, 21 U.S.C. section 360bbb-3(b)(1), unless the authorization is terminated or revoked sooner. Performed at [facility], [place] [address], [place], [place] [address]      Labs: BNP (last 3 results) No results for input(s): BNP in the last 8760 hours.  Basic Metabolic Panel: Recent Labs  Lab 06/25/19 1452 06/26/19 0312 06/27/19 1631  NA 139 139 138  K 4.6 3.4* 3.6  CL 103 103 100  CO2 28 28 29   GLUCOSE 94 112* 89  BUN 13 11 18   CREATININE 0.63 0.51 0.88  CALCIUM 8.5* 8.8* 8.9  MG  --   --  2.2   Liver Function Tests: No results for input(s): AST, ALT, ALKPHOS, BILITOT, PROT, ALBUMIN in the last 168 hours. No results for input(s): LIPASE, AMYLASE in the last 168 hours. No results for input(s): AMMONIA in the last 168 hours. CBC: Recent Labs  Lab 06/25/19 1452 06/26/19 0312 06/28/19 0250  WBC 8.0 6.1 6.6  NEUTROABS 6.1  --   --   HGB 10.1* 14.0 13.6  HCT 32.0* 43.6 41.9  MCV 97.6 94.8 94.8  PLT 138* 209 188   Cardiac Enzymes: No results for input(s): CKTOTAL, CKMB, CKMBINDEX, TROPONINI in the last 168 hours. BNP: Invalid input(s): POCBNP CBG: No results for input(s): GLUCAP in the last 168 hours. D-Dimer No results for input(s): DDIMER in the last 72 hours. Hgb A1c No results for input(s): HGBA1C in the last 72 hours. Lipid Profile No results for input(s): CHOL, HDL, LDLCALC, TRIG, CHOLHDL, LDLDIRECT in the last 72 hours. Thyroid function studies No results for input(s): TSH, T4TOTAL, T3FREE, THYROIDAB in the last 72 hours.  Invalid input(s): FREET3 Anemia work up Recent  Labs    06/26/19 0312  VITAMINB12 161*  FOLATE 10.0  FERRITIN 68  TIBC 362  IRON 78  RETICCTPCT 1.5   Urinalysis    Component Value Date/Time   COLORURINE YELLOW 06/11/2016 1518   APPEARANCEUR CLEAR 06/11/2016 1518   LABSPEC 1.025 06/11/2016 1518   PHURINE 6.0 06/11/2016 1518   GLUCOSEU NEGATIVE 06/11/2016 1518   HGBUR SMALL (A) 06/11/2016 1518   BILIRUBINUR NEGATIVE 06/11/2016 1518   KETONESUR NEGATIVE 06/11/2016 1518   PROTEINUR NEGATIVE 03/20/2015 1003   UROBILINOGEN 0.2 06/11/2016 1518   NITRITE NEGATIVE 06/11/2016 1518   LEUKOCYTESUR NEGATIVE 06/11/2016 1518   Sepsis Labs Invalid input(s): PROCALCITONIN,  WBC,  LACTICIDVEN Microbiology Recent Results (from the past 240 hour(s))  SARS CORONAVIRUS 2 (TAT 6-24 HRS) Nasopharyngeal Nasopharyngeal Swab     Status: None   Collection Time: 06/25/19  6:23 PM   Specimen: Nasopharyngeal Swab  Result Value Ref Range Status   SARS Coronavirus 2 NEGATIVE NEGATIVE Final    Comment: (NOTE) SARS-CoV-2 target nucleic acids are NOT DETECTED. The SARS-CoV-2 RNA is generally detectable in upper and lower respiratory specimens during the acute phase of infection. Negative results do not preclude SARS-CoV-2 infection, do not rule out co-infections with other pathogens, and should not be used as the sole basis for treatment or other patient management decisions. Negative results must be combined with clinical observations, patient history, and epidemiological information. The expected result is Negative. Fact Sheet for Patients: SugarRoll.be Fact Sheet for Healthcare Providers: https://www.[facility].com/ This test is not yet approved or cleared by the [place] FDA and  has been authorized for detection and/or diagnosis of SARS-CoV-2 by FDA under an Emergency Use Authorization (EUA). This EUA will remain  in effect (meaning this test can be used) for the duration of the COVID-19  declaration under Section 56 4(b)(1) of the Act, 21 U.S.C. section 360bbb-3(b)(1), unless the authorization is terminated or revoked sooner. Performed at [facility], [place] [address] [place], [place] [address]      Time coordinating discharge:  39 minutes  SIGNED:   Georgette Shell, MD  [facility] 06/28/2019, 10:09 AM Pager   If 7PM-7AM, please contact night-coverage [email or web address] Password [email or web address]

## 2019-06-28 NOTE — Telephone Encounter (Signed)
Ok to send

## 2019-07-02 NOTE — Telephone Encounter (Signed)
Faxed notes 07/02/2019

## 2019-07-02 NOTE — Telephone Encounter (Signed)
[place] with me.  Jean Bowen. Jerline Pain, MD 07/02/2019 8:10 AM

## 2019-07-03 ENCOUNTER — Non-Acute Institutional Stay (SKILLED_NURSING_FACILITY): Payer: Medicare Other | Admitting: Internal Medicine

## 2019-07-03 ENCOUNTER — Encounter: Payer: Self-pay | Admitting: Family Medicine

## 2019-07-03 ENCOUNTER — Encounter: Payer: Self-pay | Admitting: Internal Medicine

## 2019-07-03 DIAGNOSIS — E7800 Pure hypercholesterolemia, unspecified: Secondary | ICD-10-CM

## 2019-07-03 DIAGNOSIS — S63259S Unspecified dislocation of unspecified finger, sequela: Secondary | ICD-10-CM

## 2019-07-03 DIAGNOSIS — N3281 Overactive bladder: Secondary | ICD-10-CM

## 2019-07-03 DIAGNOSIS — E039 Hypothyroidism, unspecified: Secondary | ICD-10-CM

## 2019-07-03 DIAGNOSIS — S3282XA Multiple fractures of pelvis without disruption of pelvic ring, initial encounter for closed fracture: Secondary | ICD-10-CM

## 2019-07-03 DIAGNOSIS — I10 Essential (primary) hypertension: Secondary | ICD-10-CM

## 2019-07-03 DIAGNOSIS — M810 Age-related osteoporosis without current pathological fracture: Secondary | ICD-10-CM

## 2019-07-03 DIAGNOSIS — F325 Major depressive disorder, single episode, in full remission: Secondary | ICD-10-CM

## 2019-07-03 DIAGNOSIS — K219 Gastro-esophageal reflux disease without esophagitis: Secondary | ICD-10-CM

## 2019-07-03 NOTE — Progress Notes (Signed)
A user error has taken place.

## 2019-07-03 NOTE — Progress Notes (Signed)
Patient ID: Jean Bowen, female   DOB: [birth year], 74 y.o.   MRN: [id number]  Provider:  Rexene Edison. Mariea Clonts, D.O., C.M.D. Location:  [facility] Room Number: 101  Place of Service:  SNF (31)  PCP: Vivi Barrack, MD Patient Care Team: Vivi Barrack, MD as PCP - General (Family Medicine) Gaynelle Arabian, MD as Attending Physician (Orthopedic Surgery) Lafayette Dragon, MD (Inactive) as Attending Physician (Gastroenterology) Jarome Matin, MD as Consulting Physician (Dermatology) Clent Jacks, MD as Consulting Physician (Ophthalmology) Lonzo Candy, AUD as Consulting Physician (Audiology)  Extended Emergency Contact Information Primary Emergency Contact: Polakowski,Jack Address: [place], [place] [address] [address] [place] Phone: [phone] Relation: Neighbor  Code Status: DNR Goals of Care: Advanced Directive information Advanced Directives 07/03/2019  Does Patient Have a Medical Advance Directive? No  Type of Advance Directive -  Does patient want to make changes to medical advance directive? -  Copy of [place] in Chart? -  Would patient like information on creating a medical advance directive? No - Patient declined      Chief Complaint  Patient presents with  . New Admit To SNF    new admisson for rehab of pelvic fractures     HPI: Patient is a 74 y.o. female with h/o osteoporosis with prior wrist and arm fx (previously taken reclast, fosamax, bone density report also says forteo but she did not say she took it), hypertension, overactive bladder, hypothyroidism, depression, herpes seen today for admission to [facility] for rehab s/p fall with pelvic fractures.  She reports she had just finished taking the dog out and saw that her [place] were delivered to the wrong place, she went up the three brick steps to pick up the packages that belonged to her and one of them was a large box.   She could not see where she was stepping and fell down the brick steps.  She had immediate left groin and hip pain.  It took her an hour to get herself back into her house to call for an appt.  She also had dislocated the distal part of her left 4th finger and actually put it back in place herself by pulling on it.  It remains swollen now, but there was no fx on the xrays.  She reports she could not be seen in her medical office for 4 days and then wound up going to the ED 3/8.  There, she was found to have left pubic ramus, left acetabular, and left sacral ala fracture.  She was seen by orthopedics, Dr. Doran Durand, who recommended conservative mgt, PT, WBAT to left leg.  Therapy eval recommended snf for rehab.  She was sent with a 5 day course of norco 5/325 1 q 6h prn which she reports using once the first day here.  She has been using the muscle relaxer some.  She has chronic left-sided low back pain that continues to bother her at times.  She requests some tylenol and ice for it and for her left hip region and the left 4th digit swelling.  She did very well with PT this morning--ambulated all the way from her SNF apt to the assisted living dining room.  Pain at rest when I saw her was 1/10.  She notes the had occasional winces of pain if she put her weight just the wrong way on the left leg, but the next  step would then be ok.  She is eager to go home.  She notes bowels are now moving well.  She wishes she could sleep on her side some.    We discussed her fall and my recommendation that when she is discharged home, she begin on another osteoporosis therapy; however, she was not excited about this after taking fosamax before.  I mentioned prolia and evenity to her.  She should at least resume a vitamin D3 supplement and weightbearing exercise program if nothing else.  We also touched on why she fell which sounds like it was mechanical and due to not being able to see her feet with a big box in both hands.  She  wonders about her prior right frontal intraparenchymal hemorrhage playing a role (she was to see neurology but says she waited 2.5 hrs and she's not waiting that long for anything) and wound up leaving.    Her usual voltaren and ibuprofen were stopped during her hospital stay.  She also had reported to the hospitalists that she did not take her statin med.  She was maintained on her normal meds otherwise.  Past Medical History:  Diagnosis Date  . ANGIOEDEMA 08/22/09  . DEPRESSION 12/03/2006  . DERMATITIS, PERIORAL 01/08/2010  . Diverticulosis   . GERD 03/03/2007  . Hiatal hernia   . History of benign gastric tumor    significant for a benign gastric tumor that weighed approximately 5 pounds and was resected.  Marland Kitchen HYPERCHOLESTEROLEMIA 02/23/2010  . Hyperplastic colon polyp 12/03/2006  . HYPERTENSION 12/03/2006  . OSA (obstructive sleep apnea)   . OSTEOPOROSIS 12/03/2006  . Sleep apnea   . URINARY INCONTINENCE 12/03/2006   Past Surgical History:  Procedure Laterality Date  . BUNIONECTOMY     both feet  . CATARACT EXTRACTION     Bil  . CHOLECYSTECTOMY    . ELECTROCARDIOGRAM  02/07/2006  . ESOPHAGOGASTRODUODENOSCOPY  11/17/2006  . Hx of nasal septal reconstruction for deviated septum  1980's  . KNEE ARTHROSCOPY     Bilateral-torn meniscus  . 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 Stress Cardiolite  02/14/2006  . TUMOR REMOVAL  2003   removal 5 lb benign tumor from abdomen    Social History   Socioeconomic History  . Marital status: Widowed    Spouse name: Aaravi Hosford   . Number of children: Not on file  . Years of education: Not on file  . Highest education level: Not on file  Occupational History  . Occupation: Company secretary and travel  Tobacco Use  . Smoking status: Former Smoker    Packs/day: 2.00    Years: 30.00    Pack years: 60.00    Types: Cigarettes    Quit date: 05/22/2001    Years since quitting: 18.1  .  Smokeless tobacco: Never Used  Substance and Sexual Activity  . Alcohol use: Yes    Alcohol/week: 3.0 standard drinks    Types: 3 Glasses of wine per week    Comment: 1-2/week  . Drug use: No  . Sexual activity: Not on file  Other Topics Concern  . Not on file  Social History Narrative   1 dog   Regular exercise-yes   Husband died 2018-09-25      Social Determinants of Health   Financial Resource Strain:   . Difficulty of Paying Living Expenses:   Food Insecurity:   . Worried About Charity fundraiser  in the Last Year:   . [place] in the Last Year:   Transportation Needs:   . Film/video editor (Medical):   Marland Kitchen Lack of Transportation (Non-Medical):   Physical Activity:   . Days of Exercise per Week:   . Minutes of Exercise per Session:   Stress:   . Feeling of Stress :   Social Connections:   . Frequency of Communication with Friends and Family:   . Frequency of Social Gatherings with Friends and Family:   . Attends Religious Services:   . Active Member of Clubs or Organizations:   . Attends Archivist Meetings:   Marland Kitchen Marital Status:     reports that she quit smoking about 18 years ago. Her smoking use included cigarettes. She has a 60.00 pack-year smoking history. She has never used smokeless tobacco. She reports current alcohol use of about 3.0 standard drinks of alcohol per week. She reports that she does not use drugs.  Functional Status Survey:  was independent in all ADLs pre-fall  Family History  Problem Relation Age of Onset  . Melanoma Mother   . Lung cancer Mother   . Colon cancer Father        at advanced age  . Arthritis Father   . Lung cancer Brother   . Heart attack Other        grandfather  . Diabetes Other        grandfather/grandmother    Health Maintenance  Topic Date Due  . MAMMOGRAM  04/02/2018  . TETANUS/TDAP  03/12/2020 (Originally 02/08/2016)  . COLONOSCOPY  10/16/2019  . INFLUENZA VACCINE  Completed  . DEXA SCAN   Completed  . Hepatitis C Screening  Completed  . PNA vac Low Risk Adult  Completed    Allergies  Allergen Reactions  . Doxycycline     REACTION: swollen lips and throat  . Losartan Potassium     REACTION: ? of angioedema  . Morphine And Related     Caused vomiting when given too fast in IV.    Outpatient Encounter Medications as of 07/03/2019  Medication Sig  . acyclovir (ZOVIRAX) 400 MG tablet Take 400 mg by mouth daily. 1 table ;oral special instructions: for suppression once a morning: 08:00am -11:00am  . amLODipine (NORVASC) 2.5 MG tablet Take 1 tablet (2.5 mg total) by mouth daily.  Marland Kitchen aspirin EC 81 MG tablet Take 1 tablet (81 mg total) by mouth 2 (two) times daily.  Marland Kitchen escitalopram (LEXAPRO) 10 MG tablet Take 1 tablet (10 mg total) by mouth daily.  . hydrOXYzine (ATARAX/VISTARIL) 10 MG tablet Take 1 tablet (10 mg total) by mouth 3 (three) times daily as needed for itching.  . levothyroxine (SYNTHROID) 25 MCG tablet Take 1 tablet (25 mcg total) by mouth daily before breakfast.  . methocarbamol (ROBAXIN) 500 MG tablet Take 1 tablet (500 mg total) by mouth every 6 (six) hours as needed for muscle spasms.  Marland Kitchen omeprazole (PRILOSEC) 40 MG capsule Take 1 capsule (40 mg total) by mouth daily.  . [DISCONTINUED] acyclovir (ZOVIRAX) 400 MG tablet TAKE TWO TABLETS BY MOUTH EVERY 4 HOURS WHILE AWAKE (Patient taking differently: Take 800 mg by mouth daily. )  . [DISCONTINUED] HYDROcodone-acetaminophen (NORCO/VICODIN) 5-325 MG tablet Take 1 tablet by mouth every 6 (six) hours as needed for up to 5 days for severe pain.  . [DISCONTINUED] oxybutynin (DITROPAN) 5 MG tablet Take 1 tablet (5 mg total) by mouth 3 (three) times daily.  . [  DISCONTINUED] valACYclovir (VALTREX) 1000 MG tablet 2 pills at outbreak onset repeat in 12h for each outbreak (Patient taking differently: Take 2,000 mg by mouth daily as needed (outbreak). 2 pills at outbreak onset repeat in 12h for each outbreak)   No  facility-administered encounter medications on file as of 07/03/2019.    Review of Systems  Constitutional: Negative for chills, fever and malaise/fatigue.  HENT: Negative for congestion and sore throat.   Eyes: Negative for blurred vision.  Respiratory: Negative for cough and shortness of breath.   Cardiovascular: Positive for leg swelling. Negative for chest pain and palpitations.       Some chronic left ankle swelling (thinks injured as child)  Gastrointestinal: Negative for abdominal pain, blood in stool, constipation, diarrhea and melena.  Genitourinary: Negative for dysuria.  Musculoskeletal: Positive for back pain, falls, joint pain and myalgias.  Skin: Negative for itching and rash.  Neurological: Negative for dizziness and loss of consciousness.  Endo/Heme/Allergies: Does not bruise/bleed easily.  Psychiatric/Behavioral: Negative for depression and memory loss. The patient is not nervous/anxious and does not have insomnia.     Vitals:   07/03/19 1158  BP: (!) 159/89  Pulse: 73  SpO2: 96%  Weight: 190 lb (86.2 kg)  Height: 5\' 8"  (1.727 m)   Body mass index is 28.89 kg/m. Physical Exam Vitals reviewed.  Constitutional:      General: She is not in acute distress.    Appearance: Normal appearance. She is not toxic-appearing.  HENT:     Head: Normocephalic and atraumatic.     Right Ear: External ear normal.     Left Ear: External ear normal.     Nose: Nose normal.     Mouth/Throat:     Mouth: Mucous membranes are dry.  Eyes:     Extraocular Movements: Extraocular movements intact.     Conjunctiva/sclera: Conjunctivae normal.     Pupils: Pupils are equal, round, and reactive to light.     Comments: glasses  Cardiovascular:     Rate and Rhythm: Normal rate and regular rhythm.     Pulses: Normal pulses.     Heart sounds: Normal heart sounds. No murmur.  Pulmonary:     Effort: Pulmonary effort is normal.     Breath sounds: Normal breath sounds. No wheezing, rhonchi  or rales.  Abdominal:     General: Bowel sounds are normal. There is no distension.     Palpations: Abdomen is soft. There is no mass.     Tenderness: There is no abdominal tenderness. There is no guarding or rebound.  Musculoskeletal:        General: Normal range of motion.     Cervical back: Neck supple.     Comments: Mild nonpitting edema bilaterally; tender over left SI and left buttock  Skin:    General: Skin is warm and dry.     Capillary Refill: Capillary refill takes less than 2 seconds.  Neurological:     General: No focal deficit present.     Mental Status: She is alert and oriented to person, place, and time.     Cranial Nerves: No cranial nerve deficit.     Motor: No weakness.     Comments: Ambulating with rolling walker with skis with PT  Psychiatric:        Mood and Affect: Mood normal.        Behavior: Behavior normal.        Thought Content: Thought content normal.  Judgment: Judgment normal.     Labs reviewed: Basic Metabolic Panel: Recent Labs    06/25/19 1452 06/26/19 0312 06/27/19 1631  NA 139 139 138  K 4.6 3.4* 3.6  CL 103 103 100  CO2 28 28 29   GLUCOSE 94 112* 89  BUN 13 11 18   CREATININE 0.63 0.51 0.88  CALCIUM 8.5* 8.8* 8.9  MG  --   --  2.2   Liver Function Tests: Recent Labs    03/13/19 1630  AST 20  ALT 26  ALKPHOS 79  BILITOT 0.5  PROT 7.1  ALBUMIN 4.0   No results for input(s): LIPASE, AMYLASE in the last 8760 hours. No results for input(s): AMMONIA in the last 8760 hours. CBC: Recent Labs    06/25/19 1452 06/26/19 0312 06/28/19 0250  WBC 8.0 6.1 6.6  NEUTROABS 6.1  --   --   HGB 10.1* 14.0 13.6  HCT 32.0* 43.6 41.9  MCV 97.6 94.8 94.8  PLT 138* 209 188   Cardiac Enzymes: No results for input(s): CKTOTAL, CKMB, CKMBINDEX, TROPONINI in the last 8760 hours. BNP: Invalid input(s): POCBNP Lab Results  Component Value Date   HGBA1C 5.9 03/05/2009   Lab Results  Component Value Date   TSH 2.81 03/13/2019    Lab Results  Component Value Date   VITAMINB12 161 (L) 06/26/2019   Lab Results  Component Value Date   FOLATE 10.0 06/26/2019   Lab Results  Component Value Date   IRON 78 06/26/2019   TIBC 362 06/26/2019   FERRITIN 68 06/26/2019    Imaging and Procedures obtained prior to SNF admission: CT PELVIS WO CONTRAST  Result Date: 06/25/2019 CLINICAL DATA:  Left hip pain after a fall EXAM: CT PELVIS WITHOUT CONTRAST TECHNIQUE: Multidetector CT imaging of the pelvis was performed following the standard protocol without intravenous contrast. COMPARISON:  Radiographs from 06/25/2019 FINDINGS: Urinary Tract:  Urinary bladder unremarkable. Bowel:  Sigmoid colon diverticulosis. Vascular/Lymphatic: Aortoiliac atherosclerotic vascular disease. Reproductive:  Unremarkable Other:  No supplemental non-categorized findings. Musculoskeletal: Nondisplaced acute fracture of the left anterior acetabular wall. Nondisplaced acute fracture of the left inferior pubic ramus. Subtle nondisplaced fracture the left sacral ala. Questionable transverse component of S3-4. Degenerative facet arthropathy at L5-S1 on the right. No fracture of the left proximal femur is identified. Mild spurring of the right femoral head. IMPRESSION: 1. Nondisplaced acute fractures of the left anterior acetabular wall, left inferior pubic ramus, and left sacral ala. 2. Sigmoid colon diverticulosis. 3. Aortoiliac atherosclerotic vascular disease. 4. Degenerative facet arthropathy at L5-S1 on the right. Aortic Atherosclerosis (ICD10-I70.0). Electronically Signed   By: Van Clines M.D.   On: 06/25/2019 17:07   DG Hip Unilat W or Wo Pelvis 2-3 Views Left  Result Date: 06/25/2019 CLINICAL DATA:  Left hip pain after a fall this morning. Initial encounter. EXAM: DG HIP (WITH OR WITHOUT PELVIS) 2-3V LEFT COMPARISON:  None. FINDINGS: There is no evidence of hip fracture or dislocation. There is no evidence of arthropathy or other focal bone  abnormality. IMPRESSION: Negative exam. Electronically Signed   By: Inge Rise M.D.   On: 06/25/2019 13:51    Assessment/Plan 1. Multiple closed fractures of pelvis without disruption of pelvic ring, initial encounter ([place]) -pain is well-controlled--not using norco, is using some muscle relaxant -ordered some tylenol for her if needed 650mg  po q 6h prn mild to moderate pain -added ice to be used on left hip/buttock/left 4th digit -doing great with PT -expect she will be back home  in another week or so  2. Senile osteoporosis -not on treatment even vitamins at this time (? Due to hyperparathyroid so could not take calcium) -encouraged her to take a medication for her bones like prolia or evenity since she had more fractures now--she was not excited  3. Overactive bladder -cont oxybutynin but note it can increase risk of falls and confusion (myrbetriq may be better tolerated if affordable to her)  4. Hypothyroidism, unspecified type -continue current levothyroxine Lab Results  Component Value Date   TSH 2.81 03/13/2019   5. Essential hypertension -bp typically at goal with her amlodipine (some highs now with pain)  6. Major depressive disorder, single episode, in remission ([place]) -has done well on lexapro 10mg  daily  7. Gastroesophageal reflux disease without esophagitis -continues on chronic omeprazole therapy  8. HYPERCHOLESTEROLEMIA -not on meds but direct LDL was only 38 so should not be necessary  9.  Left 4th finger dislocation -resolved, but has some residual swelling--cont to move fingers/make grips and use ice 20 mins up to qid as needed  Family/ staff Communication: discussed with SNF nurse  Labs/tests ordered: no new  Recommend resuming an osteoporosis medication as outpatient.  Vasiliki Smaldone L. Sandeep Radell, D.O. [facility] [address], [place] [address] Cell Phone (Mon-Fri 8am-5pm):  [phone] On Call:   [phone] & follow prompts after 5pm & weekends Office Phone:  [phone] Office Fax:  [phone]

## 2019-07-12 ENCOUNTER — Non-Acute Institutional Stay (SKILLED_NURSING_FACILITY): Payer: Medicare Other | Admitting: Adult Health

## 2019-07-12 DIAGNOSIS — S3282XD Multiple fractures of pelvis without disruption of pelvic ring, subsequent encounter for fracture with routine healing: Secondary | ICD-10-CM | POA: Diagnosis not present

## 2019-07-12 DIAGNOSIS — M810 Age-related osteoporosis without current pathological fracture: Secondary | ICD-10-CM

## 2019-07-12 DIAGNOSIS — E039 Hypothyroidism, unspecified: Secondary | ICD-10-CM | POA: Diagnosis not present

## 2019-07-12 DIAGNOSIS — I10 Essential (primary) hypertension: Secondary | ICD-10-CM | POA: Diagnosis not present

## 2019-07-12 DIAGNOSIS — N3281 Overactive bladder: Secondary | ICD-10-CM

## 2019-07-12 DIAGNOSIS — K219 Gastro-esophageal reflux disease without esophagitis: Secondary | ICD-10-CM

## 2019-07-13 ENCOUNTER — Encounter: Payer: Self-pay | Admitting: Adult Health

## 2019-07-13 NOTE — Progress Notes (Signed)
Location:  Occupational psychologist of Service:  SNF (31) Provider:   Cindi Carbon, [place] [phone]   Vivi Barrack, MD  Patient Care Team: Vivi Barrack, MD as PCP - General (Family Medicine) Gaynelle Arabian, MD as Attending Physician (Orthopedic Surgery) Lafayette Dragon, MD (Inactive) as Attending Physician (Gastroenterology) Jarome Matin, MD as Consulting Physician (Dermatology) Clent Jacks, MD as Consulting Physician (Ophthalmology) Lonzo Candy, AUD as Consulting Physician (Audiology)  Extended Emergency Contact Information Primary Emergency Contact: Polakowski,Jack Address: [place],  [place] [address] [address] of [place] Phone: [phone] Relation: Neighbor  Code Status:  DNR Goals of care: Advanced Directive information Advanced Directives 07/03/2019  Does Patient Have a Medical Advance Directive? No  Type of Advance Directive -  Does patient want to make changes to medical advance directive? -  Copy of [place] in Chart? -  Would patient like information on creating a medical advance directive? No - Patient declined     Chief Complaint  Patient presents with  . Acute Visit    f/u pelvic fracture    HPI:  Pt is a 74 y.o. female seen today for follow up regarding pelvic fractures.  She was admitted to [facility] after a fall with pelvic fractures s/p hospitalization 06/25/19-06/28/19.  She had a mechanical fall on the front porch and sustained nondisplaced fractures of the left pubic ramus, acetabular, and sacral ala.  She was seen by ortho and recommended PT, WBAT, and pain management with Norco outpt for 5 days. Now she is using tylenol as needed and states that it controls her pain. She is ambulating independently with a walker. Bowels are moving well. She is using compression hose and has slight edema to the left leg but no calf pain, tenderness, cp, or sob. She states  that she wants to gain more confidence with therapy and will go home next week. Vitals are wnl. CBC and BMP were drawn 06/27/19 and Hgb was improved at 13.6 and other values were wnl.    Past Medical History:  Diagnosis Date  . ANGIOEDEMA 08/22/09  . DEPRESSION 12/03/2006  . DERMATITIS, PERIORAL 01/08/2010  . Diverticulosis   . GERD 03/03/2007  . Hiatal hernia   . History of benign gastric tumor    significant for a benign gastric tumor that weighed approximately 5 pounds and was resected.  Marland Kitchen HYPERCHOLESTEROLEMIA 02/23/2010  . Hyperplastic colon polyp 12/03/2006  . HYPERTENSION 12/03/2006  . OSA (obstructive sleep apnea)   . OSTEOPOROSIS 12/03/2006  . Sleep apnea   . URINARY INCONTINENCE 12/03/2006   Past Surgical History:  Procedure Laterality Date  . BUNIONECTOMY     both feet  . CATARACT EXTRACTION     Bil  . CHOLECYSTECTOMY    . ELECTROCARDIOGRAM  02/07/2006  . ESOPHAGOGASTRODUODENOSCOPY  11/17/2006  . Hx of nasal septal reconstruction for deviated septum  1980's  . KNEE ARTHROSCOPY     Bilateral-torn meniscus  . 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 Stress Cardiolite  02/14/2006  . TUMOR REMOVAL  2003   removal 5 lb benign tumor from abdomen    Allergies  Allergen Reactions  . Doxycycline     REACTION: swollen lips and throat  . Losartan Potassium     REACTION: ? of angioedema  . Morphine And Related     Caused vomiting when  given too fast in IV.    Outpatient Encounter Medications as of 07/12/2019  Medication Sig  . acetaminophen (TYLENOL) 325 MG tablet Take 650 mg by mouth every 6 (six) hours as needed.  Marland Kitchen oxybutynin (DITROPAN) 5 MG tablet Take 5 mg by mouth 3 (three) times daily.  Marland Kitchen acyclovir (ZOVIRAX) 400 MG tablet Take 400 mg by mouth daily. 1 table ;oral special instructions: for suppression once a morning: 08:00am -11:00am  . amLODipine (NORVASC) 2.5 MG tablet Take 1 tablet (2.5 mg total) by mouth daily.  Marland Kitchen aspirin EC 81 MG  tablet Take 1 tablet (81 mg total) by mouth 2 (two) times daily.  Marland Kitchen escitalopram (LEXAPRO) 10 MG tablet Take 1 tablet (10 mg total) by mouth daily.  . hydrOXYzine (ATARAX/VISTARIL) 10 MG tablet Take 1 tablet (10 mg total) by mouth 3 (three) times daily as needed for itching.  . levothyroxine (SYNTHROID) 25 MCG tablet Take 1 tablet (25 mcg total) by mouth daily before breakfast.  . methocarbamol (ROBAXIN) 500 MG tablet Take 1 tablet (500 mg total) by mouth every 6 (six) hours as needed for muscle spasms.  Marland Kitchen omeprazole (PRILOSEC) 40 MG capsule Take 1 capsule (40 mg total) by mouth daily.   No facility-administered encounter medications on file as of 07/12/2019.    Review of Systems  Constitutional: Negative for activity change, appetite change, chills, diaphoresis, fatigue, fever and unexpected weight change.  HENT: Negative for congestion.   Respiratory: Negative for cough, shortness of breath and wheezing.   Cardiovascular: Positive for leg swelling (slight on left). Negative for chest pain and palpitations.  Gastrointestinal: Negative for abdominal distention, abdominal pain, constipation and diarrhea.  Genitourinary: Negative for difficulty urinating and dysuria.  Musculoskeletal: Positive for arthralgias and gait problem. Negative for back pain, joint swelling and myalgias.  Neurological: Negative for dizziness, tremors, seizures, syncope, facial asymmetry, speech difficulty, weakness, light-headedness, numbness and headaches.  Psychiatric/Behavioral: Negative for agitation, behavioral problems and confusion.    Immunization History  Administered Date(s) Administered  . Influenza Whole 03/03/2007, 01/16/2008, 01/17/2009, 02/23/2010  . Influenza, High Dose Seasonal PF 03/01/2016  . Influenza,inj,Quad PF,6+ Mos 01/09/2014, 02/11/2015  . Influenza-Unspecified 01/03/2012, 01/14/2017, 12/20/2017, 12/30/2018  . PFIZER SARS-COV-2 Vaccination 05/12/2019, 06/03/2019  . Pneumococcal Conjugate-13  01/09/2014  . Pneumococcal Polysaccharide-23 03/03/2007, 03/17/2016  . Td 02/07/2006  . Zoster 04/19/2005  . Zoster Recombinat (Shingrix) 02/07/2017   Pertinent  Health Maintenance Due  Topic Date Due  . MAMMOGRAM  04/02/2018  . COLONOSCOPY  10/16/2019  . INFLUENZA VACCINE  Completed  . DEXA SCAN  Completed  . PNA vac Low Risk Adult  Completed   Fall Risk  03/13/2019 02/23/2019 02/16/2018 02/08/2017  Falls in the past year? 0 0 No No  Injury with Fall? - 0 - -  Follow up - Falls evaluation completed;Education provided;Falls prevention discussed - -   Functional Status Survey:    Vitals:   07/13/19 1113  Weight: 190 lb (86.2 kg)   Body mass index is 28.89 kg/m. Physical Exam Vitals and nursing note reviewed.  Constitutional:      General: She is not in acute distress.    Appearance: She is not diaphoretic.  HENT:     Head: Normocephalic and atraumatic.  Neck:     Vascular: No JVD.  Cardiovascular:     Rate and Rhythm: Normal rate and regular rhythm.     Heart sounds: No murmur.  Pulmonary:     Effort: Pulmonary effort is normal. No respiratory distress.  Breath sounds: Normal breath sounds. No wheezing.  Abdominal:     General: Bowel sounds are normal.     Palpations: Abdomen is soft.  Musculoskeletal:     Right lower leg: No edema.     Left lower leg: Edema (very mild and not pitting. No ttp and neg homans sign) present.  Skin:    General: Skin is warm and dry.  Neurological:     Mental Status: She is alert and oriented to person, place, and time.  Psychiatric:        Mood and Affect: Mood normal.     Labs reviewed: Recent Labs    06/25/19 1452 06/26/19 0312 06/27/19 1631  NA 139 139 138  K 4.6 3.4* 3.6  CL 103 103 100  CO2 28 28 29   GLUCOSE 94 112* 89  BUN 13 11 18   CREATININE 0.63 0.51 0.88  CALCIUM 8.5* 8.8* 8.9  MG  --   --  2.2   Recent Labs    03/13/19 1630  AST 20  ALT 26  ALKPHOS 79  BILITOT 0.5  PROT 7.1  ALBUMIN 4.0    Recent Labs    06/25/19 1452 06/26/19 0312 06/28/19 0250  WBC 8.0 6.1 6.6  NEUTROABS 6.1  --   --   HGB 10.1* 14.0 13.6  HCT 32.0* 43.6 41.9  MCV 97.6 94.8 94.8  PLT 138* 209 188   Lab Results  Component Value Date   TSH 2.81 03/13/2019   Lab Results  Component Value Date   HGBA1C 5.9 03/05/2009   Lab Results  Component Value Date   CHOL 164 03/13/2019   HDL 49.70 03/13/2019   LDLCALC 121 (H) 03/06/2012   LDLDIRECT 38.0 03/13/2019   TRIG 302.0 (H) 03/13/2019   CHOLHDL 3 03/13/2019    Significant Diagnostic Results in last 30 days:  CT PELVIS WO CONTRAST  Result Date: 06/25/2019 CLINICAL DATA:  Left hip pain after a fall EXAM: CT PELVIS WITHOUT CONTRAST TECHNIQUE: Multidetector CT imaging of the pelvis was performed following the standard protocol without intravenous contrast. COMPARISON:  Radiographs from 06/25/2019 FINDINGS: Urinary Tract:  Urinary bladder unremarkable. Bowel:  Sigmoid colon diverticulosis. Vascular/Lymphatic: Aortoiliac atherosclerotic vascular disease. Reproductive:  Unremarkable Other:  No supplemental non-categorized findings. Musculoskeletal: Nondisplaced acute fracture of the left anterior acetabular wall. Nondisplaced acute fracture of the left inferior pubic ramus. Subtle nondisplaced fracture the left sacral ala. Questionable transverse component of S3-4. Degenerative facet arthropathy at L5-S1 on the right. No fracture of the left proximal femur is identified. Mild spurring of the right femoral head. IMPRESSION: 1. Nondisplaced acute fractures of the left anterior acetabular wall, left inferior pubic ramus, and left sacral ala. 2. Sigmoid colon diverticulosis. 3. Aortoiliac atherosclerotic vascular disease. 4. Degenerative facet arthropathy at L5-S1 on the right. Aortic Atherosclerosis (ICD10-I70.0). Electronically Signed   By: Van Clines M.D.   On: 06/25/2019 17:07   DG Hip Unilat W or Wo Pelvis 2-3 Views Left  Result Date:  06/25/2019 CLINICAL DATA:  Left hip pain after a fall this morning. Initial encounter. EXAM: DG HIP (WITH OR WITHOUT PELVIS) 2-3V LEFT COMPARISON:  None. FINDINGS: There is no evidence of hip fracture or dislocation. There is no evidence of arthropathy or other focal bone abnormality. IMPRESSION: Negative exam. Electronically Signed   By: Inge Rise M.D.   On: 06/25/2019 13:51    Assessment/Plan 1. Multiple closed fractures of pelvis without disruption of pelvic ring with routine healing, subsequent encounter Improved mobility and pain.  Continue PT and OT and will likely discharge next week. F/U with ortho as indicated.  Continue compression hose.  Continues on aspirin twice daily which can likely be discontinued at discharge  2. Senile osteoporosis Consider treatment for OP outpt upon discharge. (has been on fosamax in the past)  3. Hypothyroidism, unspecified type Lab Results  Component Value Date   TSH 2.81 03/13/2019   Continue Synthroid 25 mcg qd   4. Essential hypertension Controlled, continue norvasc 2.5 mg qd   5. Overactive bladder Controlled, continue Ditropan 5 mg tid   6. Gastroesophageal reflux disease without esophagitis Continue Prilosec 40 mg qd     Family/ staff Communication: resident  Labs/tests ordered:  NA

## 2019-08-01 ENCOUNTER — Other Ambulatory Visit: Payer: Self-pay | Admitting: Family Medicine

## 2019-08-01 NOTE — Telephone Encounter (Signed)
Rx sent 

## 2019-08-01 NOTE — Telephone Encounter (Signed)
Dr. Jerline Pain, pt requesting refill on Acyclovir you have not filled. Okay to fill?

## 2019-08-01 NOTE — Telephone Encounter (Signed)
Ok with me. Please place any necessary orders. 

## 2019-10-22 ENCOUNTER — Other Ambulatory Visit: Payer: Self-pay | Admitting: Family Medicine

## 2019-10-23 NOTE — Telephone Encounter (Signed)
Please advise if refill is okay and the directions?

## 2020-01-30 ENCOUNTER — Other Ambulatory Visit: Payer: Self-pay | Admitting: Family Medicine

## 2020-02-25 ENCOUNTER — Telehealth: Payer: Self-pay

## 2020-02-25 ENCOUNTER — Other Ambulatory Visit: Payer: Self-pay

## 2020-02-25 ENCOUNTER — Emergency Department (HOSPITAL_COMMUNITY): Payer: Medicare Other

## 2020-02-25 ENCOUNTER — Emergency Department (HOSPITAL_COMMUNITY)
Admission: EM | Admit: 2020-02-25 | Discharge: 2020-02-25 | Disposition: A | Discharge status: Home / Self Care | Payer: Medicare Other | Attending: Emergency Medicine | Admitting: Emergency Medicine

## 2020-02-25 DIAGNOSIS — Z7982 Long term (current) use of aspirin: Secondary | ICD-10-CM | POA: Diagnosis not present

## 2020-02-25 DIAGNOSIS — Z23 Encounter for immunization: Secondary | ICD-10-CM | POA: Diagnosis not present

## 2020-02-25 DIAGNOSIS — S0083XA Contusion of other part of head, initial encounter: Secondary | ICD-10-CM

## 2020-02-25 DIAGNOSIS — W01198A Fall on same level from slipping, tripping and stumbling with subsequent striking against other object, initial encounter: Secondary | ICD-10-CM | POA: Diagnosis not present

## 2020-02-25 DIAGNOSIS — Z85028 Personal history of other malignant neoplasm of stomach: Secondary | ICD-10-CM | POA: Diagnosis not present

## 2020-02-25 DIAGNOSIS — S63502A Unspecified sprain of left wrist, initial encounter: Secondary | ICD-10-CM | POA: Diagnosis not present

## 2020-02-25 DIAGNOSIS — Z87891 Personal history of nicotine dependence: Secondary | ICD-10-CM | POA: Diagnosis not present

## 2020-02-25 DIAGNOSIS — S01111A Laceration without foreign body of right eyelid and periocular area, initial encounter: Secondary | ICD-10-CM | POA: Diagnosis not present

## 2020-02-25 DIAGNOSIS — Z85828 Personal history of other malignant neoplasm of skin: Secondary | ICD-10-CM | POA: Diagnosis not present

## 2020-02-25 DIAGNOSIS — Y9301 Activity, walking, marching and hiking: Secondary | ICD-10-CM | POA: Insufficient documentation

## 2020-02-25 DIAGNOSIS — S20211A Contusion of right front wall of thorax, initial encounter: Secondary | ICD-10-CM | POA: Diagnosis not present

## 2020-02-25 DIAGNOSIS — Z79899 Other long term (current) drug therapy: Secondary | ICD-10-CM | POA: Insufficient documentation

## 2020-02-25 DIAGNOSIS — I10 Essential (primary) hypertension: Secondary | ICD-10-CM | POA: Insufficient documentation

## 2020-02-25 DIAGNOSIS — S0990XA Unspecified injury of head, initial encounter: Secondary | ICD-10-CM | POA: Diagnosis present

## 2020-02-25 DIAGNOSIS — W19XXXA Unspecified fall, initial encounter: Secondary | ICD-10-CM

## 2020-02-25 MED ORDER — TETANUS-DIPHTH-ACELL PERTUSSIS 5-2.5-18.5 LF-MCG/0.5 IM SUSY
0.5000 mL | PREFILLED_SYRINGE | Freq: Once | INTRAMUSCULAR | Status: AC
  Administered 2020-02-25: 0.5 mL via INTRAMUSCULAR
  Filled 2020-02-25: qty 0.5

## 2020-02-25 NOTE — ED Triage Notes (Signed)
Patient reports to the ER after falling. Patient reports a mechanical fall and hitting her face on the left side, ribs, and trying to catch herself on the left arm. Patient reports she is not on blood thinners.

## 2020-02-25 NOTE — Progress Notes (Signed)
Orthopedic Tech Progress Note Patient Details:  Jean Bowen 23-May-1945 [id number]  Ortho Devices Type of Ortho Device: Velcro wrist forearm splint Ortho Device/Splint Interventions: Application   Post Interventions Patient Tolerated: Well Instructions Provided: Care of device   [name] 02/25/2020, 2:28 PM

## 2020-02-25 NOTE — Telephone Encounter (Signed)
Nurse Assessment Nurse: Jimmye Norman, RN, [place] Date/Time (Eastern Time): 02/25/2020 9:20:17 AM Confirm and document reason for call. If symptomatic, describe symptoms. ---Caller states she fell and may have injured her wrist or arm. Caller states she hit her head and was bleeding. States injury occurred yesterday afternoon. Does the patient have any new or worsening symptoms? ---Yes Will a triage be completed? ---Yes Related visit to physician within the last 2 weeks? ---No Does the PT have any chronic conditions? (i.e. diabetes, asthma, this includes High risk factors for pregnancy, etc.) ---No Is this a behavioral health or substance abuse call? ---No Guidelines Guideline Title Affirmed Question Affirmed Notes Nurse Date/Time (Eastern Time) Head Injury [1] One or two "black eyes" (bruising, purple color of eyelids) AND [2] onset within 24 hours of head injury Jimmye Norman, RN, [place] 02/25/2020 9:23:09 AM Disp. Time Eilene Ghazi Time) Disposition Final User 02/25/2020 9:25:25 AM Go to ED Now Yes Jimmye Norman, RN, [place] PLEASE NOTE: All timestamps contained within this report are represented as [place] Standard Time. CONFIDENTIALTY NOTICE: This fax transmission is intended only for the addressee. It contains information that is legally privileged, confidential or otherwise protected from use or disclosure. If you are not the intended recipient, you are strictly prohibited from reviewing, disclosing, copying using or disseminating any of this information or taking any action in reliance on or regarding this information. If you have received this fax in error, please notify us immediately by telephone so that we can arrange for its return to [place]. Phone: [phone], Toll-Free: [phone], Fax: [phone] Page: 2 of 2 Call Id: [id number] [place] Disagree/Comply Comply Caller Understands Yes PreDisposition InappropriateToAsk Care Advice Given Per Guideline GO TO ED NOW: * You need to be seen in  the Emergency Department. Referrals GO TO FACILITY OTHER - SPECIF

## 2020-02-25 NOTE — Telephone Encounter (Signed)
Schedule pt f/u appointment please.

## 2020-02-25 NOTE — Telephone Encounter (Signed)
Patient is currently in ED 

## 2020-02-25 NOTE — ED Provider Notes (Signed)
[facility] EMERGENCY DEPARTMENT Provider Note  CSN: [id number] Arrival date & time: 02/25/20 1103    History Chief Complaint  Patient presents with  . Fall    HPI  Jean Bowen is a 74 y.o. female reports she tripped and fell yesterday >24 hours ago while walking her dog, hitting her R face, R ribs and L wrist. Complaining of moderate aching pain in L wrist, worse with movement. Moderate aching pain in R ribs, worse with deep breath. She has a bruise on her face but no significant pain there. No pain with eye movement and no blurry vision.    Past Medical History:  Diagnosis Date  . ANGIOEDEMA 08/22/09  . DEPRESSION 12/03/2006  . DERMATITIS, PERIORAL 01/08/2010  . Diverticulosis   . GERD 03/03/2007  . Hiatal hernia   . History of benign gastric tumor    significant for a benign gastric tumor that weighed approximately 5 pounds and was resected.  Marland Kitchen HYPERCHOLESTEROLEMIA 02/23/2010  . Hyperplastic colon polyp 12/03/2006  . HYPERTENSION 12/03/2006  . OSA (obstructive sleep apnea)   . OSTEOPOROSIS 12/03/2006  . Sleep apnea   . URINARY INCONTINENCE 12/03/2006    Past Surgical History:  Procedure Laterality Date  . BUNIONECTOMY     both feet  . CATARACT EXTRACTION     Bil  . CHOLECYSTECTOMY    . ELECTROCARDIOGRAM  02/07/2006  . ESOPHAGOGASTRODUODENOSCOPY  11/17/2006  . Hx of nasal septal reconstruction for deviated septum  1980's  . KNEE ARTHROSCOPY     Bilateral-torn meniscus  . 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 Stress Cardiolite  02/14/2006  . TUMOR REMOVAL  2003   removal 5 lb benign tumor from abdomen    Family History  Problem Relation Age of Onset  . Melanoma Mother   . Lung cancer Mother   . Colon cancer Father        at advanced age  . Arthritis Father   . Lung cancer Brother   . Heart attack Other        grandfather  . Diabetes Other        grandfather/grandmother    Social History   Tobacco Use  . Smoking  status: Former Smoker    Packs/day: 2.00    Years: 30.00    Pack years: 60.00    Types: Cigarettes    Quit date: 05/22/2001    Years since quitting: 18.7  . Smokeless tobacco: Never Used  Vaping Use  . Vaping Use: Never used  Substance Use Topics  . Alcohol use: Yes    Alcohol/week: 3.0 standard drinks    Types: 3 Glasses of wine per week    Comment: 1-2/week  . Drug use: No     Home Medications Prior to Admission medications   Medication Sig Start Date End Date Taking? Authorizing Provider  acetaminophen (TYLENOL) 325 MG tablet Take 650 mg by mouth every 6 (six) hours as needed.    [provider]  acyclovir (ZOVIRAX) 400 MG tablet TAKE 2 TABLETS BY MOUTH EVERY 4 HOURS WHILE AWAKE 01/30/20   Vivi Barrack, MD  amLODipine (NORVASC) 2.5 MG tablet Take 1 tablet (2.5 mg total) by mouth daily. 03/13/19   Vivi Barrack, MD  aspirin EC 81 MG tablet Take 1 tablet (81 mg total) by mouth 2 (two) times daily. 06/27/19   Wylene Simmer, MD  escitalopram (LEXAPRO) 10 MG tablet Take 1 tablet (10 mg total)  by mouth daily. 03/13/19   Vivi Barrack, MD  hydrOXYzine (ATARAX/VISTARIL) 10 MG tablet Take 1 tablet (10 mg total) by mouth 3 (three) times daily as needed for itching. 03/13/19   Vivi Barrack, MD  levothyroxine (SYNTHROID) 25 MCG tablet Take 1 tablet (25 mcg total) by mouth daily before breakfast. 03/13/19   Vivi Barrack, MD  methocarbamol (ROBAXIN) 500 MG tablet Take 1 tablet (500 mg total) by mouth every 6 (six) hours as needed for muscle spasms. 06/28/19   Georgette Shell, MD  omeprazole (PRILOSEC) 40 MG capsule Take 1 capsule (40 mg total) by mouth daily. 10/16/14   Lafayette Dragon, MD  oxybutynin (DITROPAN) 5 MG tablet Take 5 mg by mouth 3 (three) times daily.    [provider]     Allergies    Doxycycline, Losartan potassium, and Morphine and related   Review of Systems   Review of Systems A comprehensive review of systems was completed and negative  except as noted in HPI.    Physical Exam BP (!) 143/95 (BP Location: Right Arm)   Pulse 75   Temp 98.4 F (36.9 C) (Oral)   Resp 16   Ht 5\' 7"  (1.702 m)   Wt 86.2 kg   SpO2 95%   BMI 29.76 kg/m   Physical Exam Vitals and nursing note reviewed.  Constitutional:      Appearance: Normal appearance.  HENT:     Head: Normocephalic.     Comments: R periorbital ecchymosis, 2cm stellate laceration of R eyebrow mostly closed, not gaping.     Nose: Nose normal.     Mouth/Throat:     Mouth: Mucous membranes are moist.  Eyes:     Extraocular Movements: Extraocular movements intact.     Conjunctiva/sclera: Conjunctivae normal.  Cardiovascular:     Rate and Rhythm: Normal rate.  Pulmonary:     Effort: Pulmonary effort is normal.     Breath sounds: Normal breath sounds.  Chest:     Chest wall: Tenderness (R lateral ribs) present.  Abdominal:     General: Abdomen is flat.     Palpations: Abdomen is soft.     Tenderness: There is no abdominal tenderness.  Musculoskeletal:        General: Swelling and tenderness present.     Cervical back: Neck supple.     Comments: Tenderness and swelling to L wrist, ROM limited by pain, NVI  Skin:    General: Skin is warm and dry.  Neurological:     General: No focal deficit present.     Mental Status: She is alert.  Psychiatric:        Mood and Affect: Mood normal.      ED Results / Procedures / Treatments   Labs (all labs ordered are listed, but only abnormal results are displayed) Labs Reviewed - No data to display  EKG None  Radiology DG Ribs Unilateral W/Chest Right  Result Date: 02/25/2020 CLINICAL DATA:  Fall.  Right lateral rib pain. EXAM: RIGHT RIBS AND CHEST - 3+ VIEW COMPARISON:  Chest x-ray 11/23/2004. FINDINGS: Radio-opaque marker has been placed on the skin at the site of patient concern. The lungs are clear without focal pneumonia, edema, pneumothorax or pleural effusion. Oblique views of the right ribs show no evidence  for acute displaced right rib fracture. IMPRESSION: Negative. Electronically Signed   By: Misty Stanley M.D.   On: 02/25/2020 13:35   DG Forearm Left  Result Date: 02/25/2020  CLINICAL DATA:  Pain following fall EXAM: LEFT FOREARM - 2 VIEW COMPARISON:  None. FINDINGS: Frontal and lateral views were obtained. No fracture or dislocation. Joint spaces appear unremarkable. No erosive change. IMPRESSION: No fracture or dislocation.  No appreciable arthropathic change. Electronically Signed   By: Lowella Grip III M.D.   On: 02/25/2020 13:07   CT Head Wo Contrast  Result Date: 02/25/2020 CLINICAL DATA:  Facial injury after fall. EXAM: CT HEAD WITHOUT CONTRAST CT MAXILLOFACIAL WITHOUT CONTRAST TECHNIQUE: Multidetector CT imaging of the head and maxillofacial structures were performed using the standard protocol without intravenous contrast. Multiplanar CT image reconstructions of the maxillofacial structures were also generated. COMPARISON:  March 20, 2015. FINDINGS: CT HEAD FINDINGS Brain: Mild chronic ischemic white matter disease is noted. No mass effect or midline shift is noted. Ventricular size is within normal limits. There is no evidence of mass lesion, hemorrhage or acute infarction. Vascular: No hyperdense vessel or unexpected calcification. Skull: Normal. Negative for fracture or focal lesion. Other: None. CT MAXILLOFACIAL FINDINGS Osseous: No fracture or mandibular dislocation. No destructive process. Orbits: Negative. No traumatic or inflammatory finding. Sinuses: Clear. Soft tissues: Subcutaneous contusion is seen lateral to the right orbit. IMPRESSION: 1. Mild chronic ischemic white matter disease. No acute intracranial abnormality seen. 2. Subcutaneous contusion is seen lateral to the right orbit. No other abnormality seen in maxillofacial region. Electronically Signed   By: Marijo Conception M.D.   On: 02/25/2020 13:18   CT Maxillofacial WO CM  Result Date: 02/25/2020 CLINICAL DATA:  Facial  injury after fall. EXAM: CT HEAD WITHOUT CONTRAST CT MAXILLOFACIAL WITHOUT CONTRAST TECHNIQUE: Multidetector CT imaging of the head and maxillofacial structures were performed using the standard protocol without intravenous contrast. Multiplanar CT image reconstructions of the maxillofacial structures were also generated. COMPARISON:  March 20, 2015. FINDINGS: CT HEAD FINDINGS Brain: Mild chronic ischemic white matter disease is noted. No mass effect or midline shift is noted. Ventricular size is within normal limits. There is no evidence of mass lesion, hemorrhage or acute infarction. Vascular: No hyperdense vessel or unexpected calcification. Skull: Normal. Negative for fracture or focal lesion. Other: None. CT MAXILLOFACIAL FINDINGS Osseous: No fracture or mandibular dislocation. No destructive process. Orbits: Negative. No traumatic or inflammatory finding. Sinuses: Clear. Soft tissues: Subcutaneous contusion is seen lateral to the right orbit. IMPRESSION: 1. Mild chronic ischemic white matter disease. No acute intracranial abnormality seen. 2. Subcutaneous contusion is seen lateral to the right orbit. No other abnormality seen in maxillofacial region. Electronically Signed   By: Marijo Conception M.D.   On: 02/25/2020 13:18    Procedures Procedures  Medications Ordered in the ED Medications  Tdap (BOOSTRIX) injection 0.5 mL (0.5 mLs Intramuscular Given 02/25/20 1348)     MDM Rules/Calculators/A&P MDM  ED Course  I have reviewed the triage vital signs and the nursing notes.  Pertinent labs & imaging results that were available during my care of the patient were reviewed by me and considered in my medical decision making (see chart for details).  Clinical Course as of Feb 24 1410  Mon Feb 25, 2020  1410 Patient's imaging reviewed, neg for acute injury. Placed in wrist brace for comfort. Laceration too remote for primary repair, wound care instructions given. TDAP updated. PCP follow up, OTC  pain meds as needed.    [CS]    Clinical Course User Index [CS] Truddie Hidden, MD    Final Clinical Impression(s) / ED Diagnoses Final diagnoses:  Fall, initial  encounter  Contusion of face, initial encounter  Laceration of right eyebrow, initial encounter  Contusion of rib on right side, initial encounter  Sprain of left wrist, initial encounter    Rx / DC Orders ED Discharge Orders    None       Truddie Hidden, MD 02/25/20 1411

## 2020-02-25 NOTE — ED Notes (Signed)
An After Visit Summary was printed and given to the patient. Discharge instructions given and no further questions at this time.  

## 2020-03-20 ENCOUNTER — Other Ambulatory Visit: Payer: Self-pay | Admitting: Family Medicine

## 2020-04-19 ENCOUNTER — Other Ambulatory Visit: Payer: Self-pay | Admitting: Family Medicine

## 2020-04-19 DIAGNOSIS — E7800 Pure hypercholesterolemia, unspecified: Secondary | ICD-10-CM

## 2020-04-19 DIAGNOSIS — E039 Hypothyroidism, unspecified: Secondary | ICD-10-CM

## 2020-04-19 DIAGNOSIS — D696 Thrombocytopenia, unspecified: Secondary | ICD-10-CM

## 2020-04-19 DIAGNOSIS — I10 Essential (primary) hypertension: Secondary | ICD-10-CM

## 2020-04-19 DIAGNOSIS — K219 Gastro-esophageal reflux disease without esophagitis: Secondary | ICD-10-CM

## 2020-04-19 DIAGNOSIS — E213 Hyperparathyroidism, unspecified: Secondary | ICD-10-CM

## 2020-04-21 NOTE — Telephone Encounter (Signed)
LAST APPOINTMENT DATE: 04/02/2019   NEXT APPOINTMENT DATE: Visit date not found    LAST REFILL: 03/13/2019  QTY: 90 ref 3

## 2020-05-01 ENCOUNTER — Other Ambulatory Visit: Payer: Self-pay | Admitting: Family Medicine

## 2020-05-02 ENCOUNTER — Other Ambulatory Visit: Payer: Self-pay | Admitting: Family Medicine

## 2020-05-27 ENCOUNTER — Other Ambulatory Visit: Payer: Self-pay | Admitting: Family Medicine

## 2020-05-27 DIAGNOSIS — D696 Thrombocytopenia, unspecified: Secondary | ICD-10-CM

## 2020-05-27 DIAGNOSIS — I10 Essential (primary) hypertension: Secondary | ICD-10-CM

## 2020-05-27 DIAGNOSIS — E213 Hyperparathyroidism, unspecified: Secondary | ICD-10-CM

## 2020-05-27 DIAGNOSIS — K219 Gastro-esophageal reflux disease without esophagitis: Secondary | ICD-10-CM

## 2020-05-27 DIAGNOSIS — E7800 Pure hypercholesterolemia, unspecified: Secondary | ICD-10-CM

## 2020-05-27 DIAGNOSIS — E039 Hypothyroidism, unspecified: Secondary | ICD-10-CM

## 2020-05-27 NOTE — Telephone Encounter (Signed)
Left message to return call to our office at their convenience. Need to schedule appointment with PCP for medicine refills  

## 2020-05-29 ENCOUNTER — Telehealth: Payer: Self-pay

## 2020-05-29 DIAGNOSIS — I10 Essential (primary) hypertension: Secondary | ICD-10-CM

## 2020-05-29 DIAGNOSIS — E7800 Pure hypercholesterolemia, unspecified: Secondary | ICD-10-CM

## 2020-05-29 DIAGNOSIS — D696 Thrombocytopenia, unspecified: Secondary | ICD-10-CM

## 2020-05-29 DIAGNOSIS — K219 Gastro-esophageal reflux disease without esophagitis: Secondary | ICD-10-CM

## 2020-05-29 DIAGNOSIS — E213 Hyperparathyroidism, unspecified: Secondary | ICD-10-CM

## 2020-05-29 DIAGNOSIS — E039 Hypothyroidism, unspecified: Secondary | ICD-10-CM

## 2020-05-29 MED ORDER — ESCITALOPRAM OXALATE 10 MG PO TABS: 10.0000 mg | ORAL_TABLET | Freq: Every day | ORAL | 3 refills | Status: DC

## 2020-05-29 NOTE — Telephone Encounter (Signed)
.   LAST APPOINTMENT DATE: 05/27/2020   NEXT APPOINTMENT DATE:@Visit  date not found  MEDICATION:escitalopram (LEXAPRO) 10 MG tablet   [facility] [address], [place] for refill?  Please advise

## 2020-05-29 NOTE — Telephone Encounter (Signed)
Rx sent in

## 2020-06-02 NOTE — Telephone Encounter (Signed)
Left voice message to schedule appointment x 2 LOV 04/02/2019

## 2020-07-11 DIAGNOSIS — M5416 Radiculopathy, lumbar region: Secondary | ICD-10-CM | POA: Diagnosis not present

## 2020-07-16 DIAGNOSIS — H04123 Dry eye syndrome of bilateral lacrimal glands: Secondary | ICD-10-CM | POA: Diagnosis not present

## 2020-07-16 DIAGNOSIS — S0011XA Contusion of right eyelid and periocular area, initial encounter: Secondary | ICD-10-CM | POA: Diagnosis not present

## 2020-07-16 DIAGNOSIS — H43813 Vitreous degeneration, bilateral: Secondary | ICD-10-CM | POA: Diagnosis not present

## 2020-07-16 DIAGNOSIS — Z961 Presence of intraocular lens: Secondary | ICD-10-CM | POA: Diagnosis not present

## 2020-07-29 DIAGNOSIS — M5417 Radiculopathy, lumbosacral region: Secondary | ICD-10-CM | POA: Diagnosis not present

## 2020-08-09 ENCOUNTER — Telehealth: Payer: Self-pay | Admitting: Family Medicine

## 2020-08-14 ENCOUNTER — Other Ambulatory Visit: Payer: Self-pay | Admitting: *Deleted

## 2020-08-14 MED ORDER — ACYCLOVIR 400 MG PO TABS
ORAL_TABLET | ORAL | 0 refills | Status: DC
Start: 2020-08-14 — End: 2020-08-21

## 2020-08-14 NOTE — Telephone Encounter (Signed)
Rx send to pharmacy  

## 2020-08-14 NOTE — Telephone Encounter (Signed)
Patient has made appt for 5/5 at 3pm.    Patient is almost out of this medication.  Can we fill medication to this date?

## 2020-08-21 ENCOUNTER — Encounter: Payer: Self-pay | Admitting: Family Medicine

## 2020-08-21 ENCOUNTER — Ambulatory Visit (INDEPENDENT_AMBULATORY_CARE_PROVIDER_SITE_OTHER): Payer: Medicare Other | Admitting: Family Medicine

## 2020-08-21 ENCOUNTER — Other Ambulatory Visit: Payer: Self-pay

## 2020-08-21 VITALS — BP 155/99 | HR 70 | Temp 96.9°F | Ht 67.0 in | Wt 183.0 lb

## 2020-08-21 DIAGNOSIS — K219 Gastro-esophageal reflux disease without esophagitis: Secondary | ICD-10-CM | POA: Diagnosis not present

## 2020-08-21 DIAGNOSIS — E7800 Pure hypercholesterolemia, unspecified: Secondary | ICD-10-CM | POA: Diagnosis not present

## 2020-08-21 DIAGNOSIS — F325 Major depressive disorder, single episode, in full remission: Secondary | ICD-10-CM | POA: Diagnosis not present

## 2020-08-21 DIAGNOSIS — E213 Hyperparathyroidism, unspecified: Secondary | ICD-10-CM | POA: Diagnosis not present

## 2020-08-21 DIAGNOSIS — D696 Thrombocytopenia, unspecified: Secondary | ICD-10-CM | POA: Diagnosis not present

## 2020-08-21 DIAGNOSIS — E039 Hypothyroidism, unspecified: Secondary | ICD-10-CM

## 2020-08-21 DIAGNOSIS — L282 Other prurigo: Secondary | ICD-10-CM

## 2020-08-21 DIAGNOSIS — D179 Benign lipomatous neoplasm, unspecified: Secondary | ICD-10-CM

## 2020-08-21 DIAGNOSIS — Z789 Other specified health status: Secondary | ICD-10-CM | POA: Diagnosis not present

## 2020-08-21 DIAGNOSIS — R739 Hyperglycemia, unspecified: Secondary | ICD-10-CM | POA: Diagnosis not present

## 2020-08-21 DIAGNOSIS — I10 Essential (primary) hypertension: Secondary | ICD-10-CM | POA: Diagnosis not present

## 2020-08-21 DIAGNOSIS — B351 Tinea unguium: Secondary | ICD-10-CM | POA: Diagnosis not present

## 2020-08-21 MED ORDER — TERBINAFINE HCL 250 MG PO TABS: 250.0000 mg | ORAL_TABLET | Freq: Every day | ORAL | 0 refills | Status: DC

## 2020-08-21 MED ORDER — OXYBUTYNIN CHLORIDE 5 MG PO TABS
5.0000 mg | ORAL_TABLET | Freq: Three times a day (TID) | ORAL | 3 refills | Status: DC
Start: 2020-08-21 — End: 2021-11-20

## 2020-08-21 MED ORDER — ESCITALOPRAM OXALATE 10 MG PO TABS: 10.0000 mg | ORAL_TABLET | Freq: Every day | ORAL | 3 refills | Status: DC

## 2020-08-21 MED ORDER — ACYCLOVIR 400 MG PO TABS
ORAL_TABLET | ORAL | 0 refills | Status: DC
Start: 2020-08-21 — End: 2021-02-11

## 2020-08-21 MED ORDER — HYDROXYZINE HCL 10 MG PO TABS: 10.0000 mg | ORAL_TABLET | Freq: Three times a day (TID) | ORAL | 1 refills | Status: DC | PRN

## 2020-08-21 MED ORDER — AMLODIPINE BESYLATE 2.5 MG PO TABS: 2.5000 mg | ORAL_TABLET | Freq: Every day | ORAL | 3 refills | Status: DC

## 2020-08-21 NOTE — Progress Notes (Signed)
   Jean Bowen is a 75 y.o. female who presents today for an office visit.  Assessment/Plan:  New/Acute Problems: Onychomycosis No red flags.  Will check labs today.  Start terbinafine 250 mg daily for 12-week course.  Lipoma No red flags.  Reassured patient.  Continue with watchful waiting.  Chronic Problems Addressed Today: GERD Continue Prilosec 40 mg daily.  Essential hypertension Above goal though typically well controlled.  Continue Norvasc 2.5 mg daily.  Check labs.  Major depressive disorder, single episode, in remission (HCC) Stable on Lexapro 10 mg daily.  Will refill today.  HYPERCHOLESTEROLEMIA Check labs.  Hypothyroidism We will check TSH.  Continue Synthroid 25 mcg daily.  She has been having some issues with fatigue recently.  Will adjust dose of Synthroid as needed.  Hyperparathyroidism ([place]) Check c-Met.  Thrombocytopenia (HCC) Check CBC.  Hyperglycemia Check A1c.  Preventative health care She is interested mumps vaccine.  She does not think she ever had mumps as a child has never been vaccinated.  We will check MMR titer today and see if we can update vaccine as needed.  She is due for colonoscopy and will be following up with GI soon for this.    Subjective:  HPI:  Patient here for yearly follow-up.  Seen about a year and a half ago.  That she has had a couple of falls with subsequent injuries including pelvic fracture and facial laceration.  She has been working with physical therapy once weekly has not had any falls recently.  She has been having some fatigue and feeling rundown but is otherwise doing well.  She has some toenail fungus that she would like to have looked at today.  She also has noticed a lump in her left side that has been there for several months and has not changed significantly.       Objective:  Physical Exam: BP (!) 155/99   Pulse 70   Temp (!) 96.9 F (36.1 C)   Ht $R'5\' 7"'rK$  (1.702 m)   Wt 183 lb (83 kg)   SpO2 94%    BMI 28.66 kg/m   Gen: No acute distress, resting comfortably CV: Regular rate and rhythm with no murmurs appreciated Pulm: Normal work of breathing, clear to auscultation bilaterally with no crackles, wheezes, or rhonchi Skin: Bilateral toenail onychomycosis.  Freely mobile approximately 2 to 3 cm lump on left flank and subcutaneous tissue. Neuro: Grossly normal, moves all extremities Psych: Normal affect and thought content  Time Spent: 44 minutes of total time was spent on the date of the encounter performing the following actions: chart review prior to seeing the patient including her recent ED visits, obtaining history, performing a medically necessary exam, counseling on the treatment plan, placing orders, and documenting in our EHR.        Algis Greenhouse. Jerline Pain, MD 08/21/2020 4:03 PM

## 2020-08-21 NOTE — Patient Instructions (Addendum)
It was very nice to see you today!  We will check blood work today.  We will check to see if you need the MMR vaccine for measles mumps and rubella.  Please start the terbinafine for the fungus in your toenail.  It will take 3 months for this to clear out.  Please schedule your colonoscopy soon.  I will see you back in year.  Come back to see me sooner if needed.  Take care, Dr Jerline Pain  PLEASE NOTE:  If you had any lab tests please let us know if you have not heard back within a few days. You may see your results on mychart before we have a chance to review them but we will give you a call once they are reviewed by [place]. If we ordered any referrals today, please let us know if you have not heard from their office within the next week.   Please try these tips to maintain a healthy lifestyle:   Eat at least 3 REAL meals and 1-2 snacks per day.  Aim for no more than 5 hours between eating.  If you eat breakfast, please do so within one hour of getting up.    Each meal should contain half fruits/vegetables, one quarter protein, and one quarter carbs (no bigger than a computer mouse)   Cut down on sweet beverages. This includes juice, soda, and sweet tea.     Drink at least 1 glass of water with each meal and aim for at least 8 glasses per day   Exercise at least 150 minutes every week.

## 2020-08-21 NOTE — Assessment & Plan Note (Signed)
Check c-Met. 

## 2020-08-21 NOTE — Assessment & Plan Note (Signed)
We will check TSH.  Continue Synthroid 25 mcg daily.  She has been having some issues with fatigue recently.  Will adjust dose of Synthroid as needed.

## 2020-08-21 NOTE — Assessment & Plan Note (Signed)
Check A1c. 

## 2020-08-21 NOTE — Assessment & Plan Note (Signed)
Above goal though typically well controlled.  Continue Norvasc 2.5 mg daily.  Check labs.

## 2020-08-21 NOTE — Assessment & Plan Note (Signed)
Continue Prilosec 40 mg daily.

## 2020-08-21 NOTE — Assessment & Plan Note (Signed)
Check CBC 

## 2020-08-21 NOTE — Assessment & Plan Note (Signed)
Check labs 

## 2020-08-21 NOTE — Assessment & Plan Note (Signed)
Stable on Lexapro 10 mg daily.  Will refill today. 

## 2020-08-22 LAB — COMPREHENSIVE METABOLIC PANEL
ALT: 16 U/L (ref 0–35)
AST: 13 U/L (ref 0–37)
Albumin: 4.2 g/dL (ref 3.5–5.2)
Alkaline Phosphatase: 68 U/L (ref 39–117)
BUN: 22 mg/dL (ref 6–23)
CO2: 32 mEq/L (ref 19–32)
Calcium: 9.7 mg/dL (ref 8.4–10.5)
Chloride: 103 mEq/L (ref 96–112)
Creatinine, Ser: 0.67 mg/dL (ref 0.40–1.20)
GFR: 85.46 mL/min (ref >60.00)
Glucose, Bld: 99 mg/dL (ref 70–99)
Potassium: 4.6 mEq/L (ref 3.5–5.1)
Sodium: 141 mEq/L (ref 135–145)
Total Bilirubin: 0.6 mg/dL (ref 0.2–1.2)
Total Protein: 7 g/dL (ref 6.0–8.3)

## 2020-08-22 LAB — LIPID PANEL
Cholesterol: 232 mg/dL — ABNORMAL HIGH (ref 0–200)
HDL: 51.6 mg/dL (ref >39.00)
NonHDL: 180.59
Total CHOL/HDL Ratio: 4
Triglycerides: 305 mg/dL — ABNORMAL HIGH (ref 0.0–149.0)
VLDL: 61 mg/dL — ABNORMAL HIGH (ref 0.0–40.0)

## 2020-08-22 LAB — CBC
HCT: 41.9 % (ref 36.0–46.0)
Hemoglobin: 14 g/dL (ref 12.0–15.0)
MCHC: 33.5 g/dL (ref 30.0–36.0)
MCV: 92.9 fl (ref 78.0–100.0)
Platelets: 236 10*3/uL (ref 150.0–400.0)
RBC: 4.51 Mil/uL (ref 3.87–5.11)
RDW: 13.9 % (ref 11.5–15.5)
WBC: 5 10*3/uL (ref 4.0–10.5)

## 2020-08-22 LAB — MEASLES/MUMPS/RUBELLA IMMUNITY
Mumps IgG: 299 AU/mL
Rubella: 20.4 Index
Rubeola IgG: >300 AU/mL

## 2020-08-22 LAB — TSH: TSH: 2.31 u[IU]/mL (ref 0.35–4.50)

## 2020-08-22 LAB — HEMOGLOBIN A1C: Hgb A1c MFr Bld: 5.8 % (ref 4.6–6.5)

## 2020-08-22 LAB — LDL CHOLESTEROL, DIRECT: Direct LDL: 68 mg/dL

## 2020-08-22 NOTE — Progress Notes (Signed)
LosqPlease inform patient of the following:  Her labs are all stable.  She has immunity to meloxicam and rubella.  She does not need reducers for any of these.  Her cholesterol and blood sugar are borderline but stable.  Do not need to make any other changes to her treatment plan at this time.  We can recheck in year.

## 2020-08-26 DIAGNOSIS — M47816 Spondylosis without myelopathy or radiculopathy, lumbar region: Secondary | ICD-10-CM | POA: Diagnosis not present

## 2020-09-05 ENCOUNTER — Other Ambulatory Visit: Payer: Self-pay

## 2020-09-05 ENCOUNTER — Telehealth: Payer: Self-pay

## 2020-09-05 MED ORDER — LEVOTHYROXINE SODIUM 25 MCG PO TABS
25.0000 ug | ORAL_TABLET | Freq: Every day | ORAL | 1 refills | Status: DC
Start: 2020-09-05 — End: 2021-03-16

## 2020-09-05 NOTE — Telephone Encounter (Signed)
Patient is returning a phone call from [place]. Wondering if someone can give her a call back.

## 2020-09-05 NOTE — Telephone Encounter (Signed)
See results note. 

## 2020-09-18 DIAGNOSIS — M47816 Spondylosis without myelopathy or radiculopathy, lumbar region: Secondary | ICD-10-CM | POA: Diagnosis not present

## 2020-11-21 ENCOUNTER — Ambulatory Visit (INDEPENDENT_AMBULATORY_CARE_PROVIDER_SITE_OTHER): Payer: Medicare Other

## 2020-11-21 DIAGNOSIS — Z0000 Encounter for general adult medical examination without abnormal findings: Secondary | ICD-10-CM | POA: Diagnosis not present

## 2020-11-21 NOTE — Patient Instructions (Addendum)
[name] , Thank you for taking time to come for your Medicare Wellness Visit. I appreciate your ongoing commitment to your health goals. Please review the following plan we discussed and let me know if I can assist you in the future.   Screening recommendations/referrals: Colonoscopy: Declined at this time  Mammogram: Done 04/02/16 Bone Density: Done 02/25/15 repeat every 2 years  Recommended yearly ophthalmology/optometry visit for glaucoma screening and checkup Recommended yearly dental visit for hygiene and checkup  Vaccinations: Influenza vaccine: Due in season  Pneumococcal vaccine: Completed  Tdap vaccine: Done 02/25/20 repeat in 10 years 02/24/30 Shingles vaccine: 12/02/16 &  02/07/17 Covid-19:Completed 1/23, 2/14, 04/18/20  Advanced directives: Please bring a copy of your health care power of attorney and living will to the office at your convenience.  Conditions/risks identified: None at this time   Next appointment: Follow up in one year for your annual wellness visit    Preventive Care 65 Years and Older, Female Preventive care refers to lifestyle choices and visits with your health care provider that can promote health and wellness. What does preventive care include? A yearly physical exam. This is also called an annual well check. Dental exams once or twice a year. Routine eye exams. Ask your health care provider how often you should have your eyes checked. Personal lifestyle choices, including: Daily care of your teeth and gums. Regular physical activity. Eating a healthy diet. Avoiding tobacco and drug use. Limiting alcohol use. Practicing safe sex. Taking low-dose aspirin every day. Taking vitamin and mineral supplements as recommended by your health care provider. What happens during an annual well check? The services and screenings done by your health care provider during your annual well check will depend on your age, overall health, lifestyle risk factors,  and family history of disease. Counseling  Your health care provider may ask you questions about your: Alcohol use. Tobacco use. Drug use. Emotional well-being. Home and relationship well-being. Sexual activity. Eating habits. History of falls. Memory and ability to understand (cognition). Work and work Statistician. Reproductive health. Screening  You may have the following tests or measurements: Height, weight, and BMI. Blood pressure. Lipid and cholesterol levels. These may be checked every 5 years, or more frequently if you are over 40 years old. Skin check. Lung cancer screening. You may have this screening every year starting at age 56 if you have a 30-pack-year history of smoking and currently smoke or have quit within the past 15 years. Fecal occult blood test (FOBT) of the stool. You may have this test every year starting at age 100. Flexible sigmoidoscopy or colonoscopy. You may have a sigmoidoscopy every 5 years or a colonoscopy every 10 years starting at age 67. Hepatitis C blood test. Hepatitis B blood test. Sexually transmitted disease (STD) testing. Diabetes screening. This is done by checking your blood sugar (glucose) after you have not eaten for a while (fasting). You may have this done every 1-3 years. Bone density scan. This is done to screen for osteoporosis. You may have this done starting at age 37. Mammogram. This may be done every 1-2 years. Talk to your health care provider about how often you should have regular mammograms. Talk with your health care provider about your test results, treatment options, and if necessary, the need for more tests. Vaccines  Your health care provider may recommend certain vaccines, such as: Influenza vaccine. This is recommended every year. Tetanus, diphtheria, and acellular pertussis (Tdap, Td) vaccine. You may need a Td  booster every 10 years. Zoster vaccine. You may need this after age 39. Pneumococcal 13-valent conjugate  (PCV13) vaccine. One dose is recommended after age 62. Pneumococcal polysaccharide (PPSV23) vaccine. One dose is recommended after age 32. Talk to your health care provider about which screenings and vaccines you need and how often you need them. This information is not intended to replace advice given to you by your health care provider. Make sure you discuss any questions you have with your health care provider. Document Released: 05/02/2015 Document Revised: 12/24/2015 Document Reviewed: 02/04/2015 Elsevier Interactive Patient Education  2017 [place] Prevention in the Home Falls can cause injuries. They can happen to people of all ages. There are many things you can do to make your home safe and to help prevent falls. What can I do on the outside of my home? Regularly fix the edges of walkways and driveways and fix any cracks. Remove anything that might make you trip as you walk through a door, such as a raised step or threshold. Trim any bushes or trees on the path to your home. Use bright outdoor lighting. Clear any walking paths of anything that might make someone trip, such as rocks or tools. Regularly check to see if handrails are loose or broken. Make sure that both sides of any steps have handrails. Any raised decks and porches should have guardrails on the edges. Have any leaves, snow, or ice cleared regularly. Use sand or salt on walking paths during winter. Clean up any spills in your garage right away. This includes oil or grease spills. What can I do in the bathroom? Use night lights. Install grab bars by the toilet and in the tub and shower. Do not use towel bars as grab bars. Use non-skid mats or decals in the tub or shower. If you need to sit down in the shower, use a plastic, non-slip stool. Keep the floor dry. Clean up any water that spills on the floor as soon as it happens. Remove soap buildup in the tub or shower regularly. Attach bath mats securely with  double-sided non-slip rug tape. Do not have throw rugs and other things on the floor that can make you trip. What can I do in the bedroom? Use night lights. Make sure that you have a light by your bed that is easy to reach. Do not use any sheets or blankets that are too big for your bed. They should not hang down onto the floor. Have a firm chair that has side arms. You can use this for support while you get dressed. Do not have throw rugs and other things on the floor that can make you trip. What can I do in the kitchen? Clean up any spills right away. Avoid walking on wet floors. Keep items that you use a lot in easy-to-reach places. If you need to reach something above you, use a strong step stool that has a grab bar. Keep electrical cords out of the way. Do not use floor polish or wax that makes floors slippery. If you must use wax, use non-skid floor wax. Do not have throw rugs and other things on the floor that can make you trip. What can I do with my stairs? Do not leave any items on the stairs. Make sure that there are handrails on both sides of the stairs and use them. Fix handrails that are broken or loose. Make sure that handrails are as long as the stairways. Check any carpeting  to make sure that it is firmly attached to the stairs. Fix any carpet that is loose or worn. Avoid having throw rugs at the top or bottom of the stairs. If you do have throw rugs, attach them to the floor with carpet tape. Make sure that you have a light switch at the top of the stairs and the bottom of the stairs. If you do not have them, ask someone to add them for you. What else can I do to help prevent falls? Wear shoes that: Do not have high heels. Have rubber bottoms. Are comfortable and fit you well. Are closed at the toe. Do not wear sandals. If you use a stepladder: Make sure that it is fully opened. Do not climb a closed stepladder. Make sure that both sides of the stepladder are locked  into place. Ask someone to hold it for you, if possible. Clearly mark and make sure that you can see: Any grab bars or handrails. First and last steps. Where the edge of each step is. Use tools that help you move around (mobility aids) if they are needed. These include: Canes. Walkers. Scooters. Crutches. Turn on the lights when you go into a dark area. Replace any light bulbs as soon as they burn out. Set up your furniture so you have a clear path. Avoid moving your furniture around. If any of your floors are uneven, fix them. If there are any pets around you, be aware of where they are. Review your medicines with your doctor. Some medicines can make you feel dizzy. This can increase your chance of falling. Ask your doctor what other things that you can do to help prevent falls. This information is not intended to replace advice given to you by your health care provider. Make sure you discuss any questions you have with your health care provider. Document Released: 01/30/2009 Document Revised: 09/11/2015 Document Reviewed: 05/10/2014 Elsevier Interactive Patient Education  2017 [employer].

## 2020-11-21 NOTE — Progress Notes (Addendum)
Virtual Visit via Telephone Note  I connected with  Jean Bowen on 11/21/20 at 11:45 AM EDT by telephone and verified that I am speaking with the correct person using two identifiers.  Medicare Annual Wellness visit completed telephonically due to Covid-19 pandemic.   Persons participating in this call: This Health Coach and this patient.   Location: Patient: Home Provider: Office   I discussed the limitations, risks, security and privacy concerns of performing an evaluation and management service by telephone and the availability of in person appointments. The patient expressed understanding and agreed to proceed.  Unable to perform video visit due to video visit attempted and failed and/or patient does not have video capability.   Some vital signs may be absent or patient reported.   [name], LPN  Subjective:   Jean Bowen is a 75 y.o. female who presents for Medicare Annual (Subsequent) preventive examination.  Review of Systems     Cardiac Risk Factors include: advanced age (>63mn, >>43women);hypertension     Objective:    There were no vitals filed for this visit. There is no height or weight on file to calculate BMI.  Advanced Directives 11/21/2020 07/03/2019 06/25/2019 02/23/2019 03/17/2016 03/19/2015 02/11/2015  Does Patient Have a Medical Advance Directive? Yes No No Yes No No No  Type of Advance Directive Living will - - Living will;Healthcare Power of Attorney - - -  Does patient want to make changes to medical advance directive? - - - No - Patient declined - - -  Copy of [place]? - - - No - copy requested - - -  Would patient like information on creating a medical advance directive? - No - Patient declined No - Patient declined - - No - patient declined information -    Current Medications (verified) Outpatient Encounter Medications as of 11/21/2020  Medication Sig   acetaminophen (TYLENOL) 325 MG tablet Take 650 mg by mouth  every 6 (six) hours as needed.   acyclovir (ZOVIRAX) 400 MG tablet TAKE TWO TABLETS BY MOUTH EVERY 4 HOURS WHILE AWAKE   amLODipine (NORVASC) 2.5 MG tablet Take 1 tablet (2.5 mg total) by mouth daily.   escitalopram (LEXAPRO) 10 MG tablet Take 1 tablet (10 mg total) by mouth daily.   hydrOXYzine (ATARAX/VISTARIL) 10 MG tablet Take 1 tablet (10 mg total) by mouth 3 (three) times daily as needed for itching.   levothyroxine (SYNTHROID) 25 MCG tablet Take 1 tablet (25 mcg total) by mouth daily with breakfast.   methocarbamol (ROBAXIN) 500 MG tablet Take 1 tablet (500 mg total) by mouth every 6 (six) hours as needed for muscle spasms.   omeprazole (PRILOSEC) 40 MG capsule Take 1 capsule (40 mg total) by mouth daily.   oxybutynin (DITROPAN) 5 MG tablet Take 1 tablet (5 mg total) by mouth 3 (three) times daily.   terbinafine (LAMISIL) 250 MG tablet Take 1 tablet (250 mg total) by mouth daily.   No facility-administered encounter medications on file as of 11/21/2020.    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   Stress Cardiolite  02/14/2006   TUMOR REMOVAL  2003   removal 5 lb benign tumor from abdomen   Family History  Problem Relation Age of Onset   Melanoma Mother    Lung cancer Mother    Colon cancer Father        at advanced age   Arthritis Father    Lung cancer Brother    Heart attack Other        grandfather   Diabetes Other        grandfather/grandmother   Social History   Socioeconomic History   Marital status: Widowed    Spouse name: Desarea Stalter    Number of children: Not on file   Years of education: Not on file   Highest education level: Not on file  Occupational History   Occupation: Company secretary and travel  Tobacco Use   Smoking status: Former    Packs/day: 2.00    Years: 30.00    Pack years: 60.00    Types: Cigarettes    Quit date: 05/22/2001    Years since quitting: 19.5   Smokeless tobacco: Never  Vaping Use   Vaping Use: Never used  Substance and Sexual Activity   Alcohol use: Yes    Alcohol/week: 3.0 standard drinks    Types: 3 Glasses of wine per week    Comment: 1-2/week   Drug use: No   Sexual activity: Not on file  Other Topics Concern   Not on file  Social History Narrative   1 dog   Regular exercise-yes   Husband died Sep 22, 2018      Social Determinants of Health   Financial Resource Strain: Low Risk    Difficulty of Paying Living Expenses: Not hard at all  Food Insecurity: No Food Insecurity   Worried About Charity fundraiser in the Last Year: Never true   Arboriculturist in the Last Year: Never true  Transportation Needs: No Transportation Needs   Lack of Transportation (Medical): No   Lack of Transportation (Non-Medical): No  Physical Activity: Sufficiently Active   Days of Exercise per Week: 7 days   Minutes of Exercise per Session: 30 min  Stress: No Stress Concern Present   Feeling of Stress : Not at all  Social Connections: Moderately Isolated   Frequency  of Communication with Friends and Family: More than three times a week   Frequency of Social Gatherings with Friends and Family: More than three times a week   Attends Religious Services: Never   Marine scientist or Organizations: Yes   Attends Archivist Meetings: 1 to 4 times per year   Marital Status: Widowed    Tobacco Counseling Counseling given: Not Answered   Clinical Intake:  Pre-visit preparation completed: Yes  Pain : No/denies pain     BMI - recorded: 28.66 Nutritional Status:  BMI 25 -29 Overweight Nutritional Risks: Nausea/ vomitting/ diarrhea Diabetes: No  How often do you need to have someone help you when you read instructions, pamphlets, or other written materials from your doctor or pharmacy?: 1 - Never  Diabetic?No  Interpreter Needed?: No  Information entered by :: Charlott Rakes, LPN   Activities of Daily Living In your present state of health, do you have any difficulty performing the following activities: 11/21/2020  Hearing? N  Vision? N  Difficulty concentrating or making decisions? N  Walking or climbing stairs? N  Dressing or bathing? N  Doing errands, shopping? N  Preparing Food and eating ? N  Using the Toilet? N  In the past six months, have you accidently leaked urine? Y  Comment wears a pad  Do you have problems with loss of bowel control? N  Managing your Medications? N  Managing your Finances? N  Housekeeping or managing your Housekeeping? N  Some recent data might be hidden    Patient Care Team: Vivi Barrack, MD as PCP - General (Family Medicine) Gaynelle Arabian, MD as Attending Physician (Orthopedic Surgery) Lafayette Dragon, MD (Inactive) as Attending Physician (Gastroenterology) Jarome Matin, MD as Consulting Physician (Dermatology) Clent Jacks, MD as Consulting Physician (Ophthalmology) Lonzo Candy, AUD as Consulting Physician (Audiology)  Indicate any recent Medical Services you may have received  from other than [facility] providers in the past year (date may be approximate).     Assessment:   This is a routine wellness examination for Navayah.  Hearing/Vision screen Hearing Screening - Comments:: Pt denies any hearing issues  Vision Screening - Comments:: Pt follows up  with Dr Katy Fitch Bi annually   Dietary issues and exercise activities discussed: Current Exercise Habits: Home exercise routine, Time (Minutes): 35, Frequency (Times/Week): 7, Weekly Exercise (Minutes/Week): 245   Goals Addressed             This Visit's Progress    Patient Stated       None at this time       Depression Screen PHQ 2/9 Scores 11/21/2020 03/13/2019 02/23/2019 02/16/2018 02/08/2017 11/23/2014  PHQ - 2 Score 1 0 0 0 0 0  PHQ- 9 Score - 6 - - - -    Fall Risk Fall Risk  11/21/2020 03/13/2019 02/23/2019 02/16/2018 02/08/2017  Falls in the past year? 0 0 0 No No  Number falls in past yr: 0 - - - -  Injury with Fall? 0 - 0 - -  Risk for fall due to : Impaired vision - - - -  Follow up Falls prevention discussed - Falls evaluation completed;Education provided;Falls prevention discussed - -    FALL RISK PREVENTION PERTAINING TO THE HOME:  Any stairs in or around the home? Yes  If so, are there any without handrails? No  Home free of loose throw rugs in walkways, pet beds, electrical cords, etc? Yes  Adequate lighting in your home to reduce risk of falls? Yes   ASSISTIVE DEVICES UTILIZED TO PREVENT FALLS:  Life alert? Yes  Use of a cane, walker or w/c? No  Grab bars in the bathroom? Yes  Shower chair or bench in shower? Yes  Elevated toilet seat or a handicapped toilet? No   TIMED UP AND GO:  Was the test performed? No .   Cognitive Function:     6CIT Screen 11/21/2020 02/23/2019  What Year? 0 points 0 points  What month? 0 points 0 points  What time? 0 points 0  points  Count back from 20 0 points 0 points  Months in reverse 0 points 0 points  Repeat phrase 0 points 0 points  Total Score 0  0    Immunizations Immunization History  Administered Date(s) Administered   Influenza Whole 03/03/2007, 01/16/2008, 01/17/2009, 02/23/2010   Influenza, High Dose Seasonal PF 03/01/2016   Influenza,inj,Quad PF,6+ Mos 01/09/2014, 02/11/2015   Influenza-Unspecified 01/03/2012, 01/14/2017, 12/20/2017, 12/30/2018   PFIZER(Purple Top)SARS-COV-2 Vaccination 05/12/2019, 06/03/2019, 04/18/2020   Pneumococcal Conjugate-13 01/09/2014   Pneumococcal Polysaccharide-23 03/03/2007, 03/17/2016   Td 02/07/2006   Tdap 02/25/2020   Zoster Recombinat (Shingrix) 12/02/2016, 02/07/2017   Zoster, Live 04/19/2005    TDAP status: Up to date  Flu Vaccine status: Due, Education has been provided regarding the importance of this vaccine. Advised may receive this vaccine at local pharmacy or Health Dept. Aware to provide a copy of the vaccination record if obtained from local pharmacy or Health Dept. Verbalized acceptance and understanding.  Pneumococcal vaccine status: Up to date  Covid-19 vaccine status: Completed vaccines  Qualifies for Shingles Vaccine? Yes   Zostavax completed Yes Shingrix Completed?: Yes  Screening Tests Health Maintenance  Topic Date Due   COVID-19 Vaccine (4 - Booster for Pfizer series) 08/16/2020   INFLUENZA VACCINE  11/17/2020   COLONOSCOPY (Pts 45-55yr Insurance coverage will need to be confirmed)  11/21/2021 (Originally 10/16/2019)   TETANUS/TDAP  02/24/2030   DEXA SCAN  Completed   Hepatitis C Screening  Completed   PNA vac Low Risk Adult  Completed   Zoster Vaccines- Shingrix  Completed   HPV VACCINES  Aged Out    Health Maintenance  Health Maintenance Due  Topic Date Due   COVID-19 Vaccine (4 - Booster for [place]) 08/16/2020   INFLUENZA VACCINE  11/17/2020    Colorectal screening declined at this time  Mammogram status: Completed 04/02/16. Repeat every year  Bone Density status: Completed 02/25/15. Results reflect: Bone density results: OSTEOPOROSIS.  Repeat every 2 years.    Additional Screening:  Hepatitis C Screening:  Completed 06/11/16  Vision Screening: Recommended annual ophthalmology exams for early detection of glaucoma and other disorders of the eye. Is the patient up to date with their annual eye exam?  Yes  Who is the provider or what is the name of the office in which the patient attends annual eye exams? Bi annually with Dr GKaty Fitch If pt is not established with a provider, would they like to be referred to a provider to establish care? No .   Dental Screening: Recommended annual dental exams for proper oral hygiene  Community Resource Referral / Chronic Care Management: CRR required this visit?  No   CCM required this visit?  No      Plan:     I have personally reviewed and noted the following in the patient's chart:   Medical and social history Use of alcohol, tobacco or illicit drugs  Current medications and supplements including opioid prescriptions.  Functional ability and status Nutritional status Physical activity Advanced directives List of other physicians Hospitalizations, surgeries, and ER visits in previous 12 months Vitals Screenings to include cognitive, depression, and falls Referrals and appointments  In addition, I have reviewed and discussed with patient certain preventive protocols, quality metrics, and best practice recommendations. A written personalized care plan for preventive services as well as general preventive health recommendations were provided to patient.     TWillette Brace LPN   [id number]  Nurse Notes: None

## 2020-11-24 DIAGNOSIS — Z23 Encounter for immunization: Secondary | ICD-10-CM | POA: Diagnosis not present

## 2021-01-15 DIAGNOSIS — M25512 Pain in left shoulder: Secondary | ICD-10-CM | POA: Diagnosis not present

## 2021-01-15 DIAGNOSIS — M19012 Primary osteoarthritis, left shoulder: Secondary | ICD-10-CM | POA: Diagnosis not present

## 2021-01-22 DIAGNOSIS — Z23 Encounter for immunization: Secondary | ICD-10-CM | POA: Diagnosis not present

## 2021-02-02 DIAGNOSIS — M19012 Primary osteoarthritis, left shoulder: Secondary | ICD-10-CM | POA: Diagnosis not present

## 2021-02-02 DIAGNOSIS — M25512 Pain in left shoulder: Secondary | ICD-10-CM | POA: Diagnosis not present

## 2021-02-11 ENCOUNTER — Other Ambulatory Visit: Payer: Self-pay | Admitting: Family Medicine

## 2021-02-27 DIAGNOSIS — Z23 Encounter for immunization: Secondary | ICD-10-CM | POA: Diagnosis not present

## 2021-03-14 ENCOUNTER — Other Ambulatory Visit: Payer: Self-pay | Admitting: Family Medicine

## 2021-03-23 DIAGNOSIS — L821 Other seborrheic keratosis: Secondary | ICD-10-CM | POA: Diagnosis not present

## 2021-03-23 DIAGNOSIS — L570 Actinic keratosis: Secondary | ICD-10-CM | POA: Diagnosis not present

## 2021-03-23 DIAGNOSIS — L905 Scar conditions and fibrosis of skin: Secondary | ICD-10-CM | POA: Diagnosis not present

## 2021-03-23 DIAGNOSIS — Z85828 Personal history of other malignant neoplasm of skin: Secondary | ICD-10-CM | POA: Diagnosis not present

## 2021-03-23 DIAGNOSIS — L853 Xerosis cutis: Secondary | ICD-10-CM | POA: Diagnosis not present

## 2021-03-23 DIAGNOSIS — D692 Other nonthrombocytopenic purpura: Secondary | ICD-10-CM | POA: Diagnosis not present

## 2021-05-11 ENCOUNTER — Other Ambulatory Visit: Payer: Self-pay | Admitting: Family Medicine

## 2021-05-14 DIAGNOSIS — U071 COVID-19: Secondary | ICD-10-CM | POA: Diagnosis not present

## 2021-07-09 DIAGNOSIS — M47816 Spondylosis without myelopathy or radiculopathy, lumbar region: Secondary | ICD-10-CM | POA: Diagnosis not present

## 2021-08-19 ENCOUNTER — Other Ambulatory Visit: Payer: Self-pay | Admitting: Family Medicine

## 2021-09-21 ENCOUNTER — Other Ambulatory Visit: Payer: Self-pay | Admitting: Family Medicine

## 2021-10-18 ENCOUNTER — Other Ambulatory Visit: Payer: Self-pay | Admitting: Family Medicine

## 2021-10-18 DIAGNOSIS — E7800 Pure hypercholesterolemia, unspecified: Secondary | ICD-10-CM

## 2021-10-18 DIAGNOSIS — E213 Hyperparathyroidism, unspecified: Secondary | ICD-10-CM

## 2021-10-18 DIAGNOSIS — D696 Thrombocytopenia, unspecified: Secondary | ICD-10-CM

## 2021-10-18 DIAGNOSIS — I10 Essential (primary) hypertension: Secondary | ICD-10-CM

## 2021-10-18 DIAGNOSIS — K219 Gastro-esophageal reflux disease without esophagitis: Secondary | ICD-10-CM

## 2021-10-18 DIAGNOSIS — E039 Hypothyroidism, unspecified: Secondary | ICD-10-CM

## 2021-11-16 ENCOUNTER — Other Ambulatory Visit: Payer: Self-pay | Admitting: Family Medicine

## 2021-11-16 DIAGNOSIS — E039 Hypothyroidism, unspecified: Secondary | ICD-10-CM

## 2021-11-16 DIAGNOSIS — K219 Gastro-esophageal reflux disease without esophagitis: Secondary | ICD-10-CM

## 2021-11-16 DIAGNOSIS — E213 Hyperparathyroidism, unspecified: Secondary | ICD-10-CM

## 2021-11-16 DIAGNOSIS — D696 Thrombocytopenia, unspecified: Secondary | ICD-10-CM

## 2021-11-16 DIAGNOSIS — I10 Essential (primary) hypertension: Secondary | ICD-10-CM

## 2021-11-16 DIAGNOSIS — E7800 Pure hypercholesterolemia, unspecified: Secondary | ICD-10-CM

## 2021-11-18 ENCOUNTER — Other Ambulatory Visit: Payer: Self-pay | Admitting: *Deleted

## 2021-11-18 ENCOUNTER — Telehealth: Payer: Self-pay | Admitting: Family Medicine

## 2021-11-18 DIAGNOSIS — E039 Hypothyroidism, unspecified: Secondary | ICD-10-CM

## 2021-11-18 DIAGNOSIS — E213 Hyperparathyroidism, unspecified: Secondary | ICD-10-CM

## 2021-11-18 DIAGNOSIS — K219 Gastro-esophageal reflux disease without esophagitis: Secondary | ICD-10-CM

## 2021-11-18 DIAGNOSIS — D696 Thrombocytopenia, unspecified: Secondary | ICD-10-CM

## 2021-11-18 DIAGNOSIS — E7800 Pure hypercholesterolemia, unspecified: Secondary | ICD-10-CM

## 2021-11-18 DIAGNOSIS — I10 Essential (primary) hypertension: Secondary | ICD-10-CM

## 2021-11-18 MED ORDER — ESCITALOPRAM OXALATE 10 MG PO TABS: 10.0000 mg | ORAL_TABLET | Freq: Every day | ORAL | 0 refills | Status: DC

## 2021-11-18 NOTE — Telephone Encounter (Signed)
Rx send to [facility]  

## 2021-11-18 NOTE — Telephone Encounter (Signed)
..   Encourage patient to contact the pharmacy for refills or they can request refills through [facility]:  08/21/20  NEXT APPOINTMENT DATE: 11/20/21  MEDICATION:escitalopram (LEXAPRO) 10 MG tablet  Is the patient out of medication?   PHARMACY: [id number] - [place], [place] Phone:  [phone]  Fax:  [phone]      Let patient know to contact pharmacy at the end of the day to make sure medication is ready.  Please notify patient to allow 48-72 hours to process

## 2021-11-19 ENCOUNTER — Other Ambulatory Visit: Payer: Self-pay | Admitting: *Deleted

## 2021-11-19 MED ORDER — ACYCLOVIR 400 MG PO TABS: ORAL_TABLET | ORAL | 0 refills | Status: DC

## 2021-11-20 ENCOUNTER — Ambulatory Visit (INDEPENDENT_AMBULATORY_CARE_PROVIDER_SITE_OTHER): Payer: Medicare Other | Admitting: Family Medicine

## 2021-11-20 VITALS — BP 126/86 | HR 74 | Temp 97.9°F | Ht 67.0 in | Wt 178.2 lb

## 2021-11-20 DIAGNOSIS — D696 Thrombocytopenia, unspecified: Secondary | ICD-10-CM

## 2021-11-20 DIAGNOSIS — K219 Gastro-esophageal reflux disease without esophagitis: Secondary | ICD-10-CM | POA: Diagnosis not present

## 2021-11-20 DIAGNOSIS — E7800 Pure hypercholesterolemia, unspecified: Secondary | ICD-10-CM

## 2021-11-20 DIAGNOSIS — D649 Anemia, unspecified: Secondary | ICD-10-CM

## 2021-11-20 DIAGNOSIS — I10 Essential (primary) hypertension: Secondary | ICD-10-CM

## 2021-11-20 DIAGNOSIS — E213 Hyperparathyroidism, unspecified: Secondary | ICD-10-CM

## 2021-11-20 DIAGNOSIS — L299 Pruritus, unspecified: Secondary | ICD-10-CM

## 2021-11-20 DIAGNOSIS — E039 Hypothyroidism, unspecified: Secondary | ICD-10-CM | POA: Diagnosis not present

## 2021-11-20 DIAGNOSIS — F325 Major depressive disorder, single episode, in full remission: Secondary | ICD-10-CM | POA: Diagnosis not present

## 2021-11-20 DIAGNOSIS — R739 Hyperglycemia, unspecified: Secondary | ICD-10-CM | POA: Diagnosis not present

## 2021-11-20 LAB — LDL CHOLESTEROL, DIRECT: Direct LDL: 56 mg/dL

## 2021-11-20 LAB — COMPREHENSIVE METABOLIC PANEL
ALT: 21 U/L (ref 0–35)
AST: 18 U/L (ref 0–37)
Albumin: 4.1 g/dL (ref 3.5–5.2)
Alkaline Phosphatase: 65 U/L (ref 39–117)
BUN: 15 mg/dL (ref 6–23)
CO2: 33 mEq/L — ABNORMAL HIGH (ref 19–32)
Calcium: 8.9 mg/dL (ref 8.4–10.5)
Chloride: 102 mEq/L (ref 96–112)
Creatinine, Ser: 0.66 mg/dL (ref 0.40–1.20)
GFR: 85.02 mL/min (ref >60.00)
Glucose, Bld: 99 mg/dL (ref 70–99)
Potassium: 4 mEq/L (ref 3.5–5.1)
Sodium: 138 mEq/L (ref 135–145)
Total Bilirubin: 0.5 mg/dL (ref 0.2–1.2)
Total Protein: 7 g/dL (ref 6.0–8.3)

## 2021-11-20 LAB — CBC
HCT: 41.4 % (ref 36.0–46.0)
Hemoglobin: 13.9 g/dL (ref 12.0–15.0)
MCHC: 33.4 g/dL (ref 30.0–36.0)
MCV: 92 fl (ref 78.0–100.0)
Platelets: 235 10*3/uL (ref 150.0–400.0)
RBC: 4.5 Mil/uL (ref 3.87–5.11)
RDW: 13.8 % (ref 11.5–15.5)
WBC: 5.1 10*3/uL (ref 4.0–10.5)

## 2021-11-20 LAB — LIPID PANEL
Cholesterol: 181 mg/dL (ref 0–200)
HDL: 45.7 mg/dL (ref >39.00)
NonHDL: 135.29
Total CHOL/HDL Ratio: 4
Triglycerides: 201 mg/dL — ABNORMAL HIGH (ref 0.0–149.0)
VLDL: 40.2 mg/dL — ABNORMAL HIGH (ref 0.0–40.0)

## 2021-11-20 LAB — TSH: TSH: 1.99 u[IU]/mL (ref 0.35–5.50)

## 2021-11-20 LAB — HEMOGLOBIN A1C: Hgb A1c MFr Bld: 5.8 % (ref 4.6–6.5)

## 2021-11-20 MED ORDER — OXYBUTYNIN CHLORIDE 5 MG PO TABS: 5.0000 mg | ORAL_TABLET | Freq: Three times a day (TID) | ORAL | 3 refills | Status: DC

## 2021-11-20 MED ORDER — LEVOTHYROXINE SODIUM 25 MCG PO TABS: 25.0000 ug | ORAL_TABLET | Freq: Every day | ORAL | 3 refills | Status: DC

## 2021-11-20 MED ORDER — ACYCLOVIR 400 MG PO TABS: 800.0000 mg | ORAL_TABLET | Freq: Every day | ORAL | 0 refills | Status: DC

## 2021-11-20 MED ORDER — ESCITALOPRAM OXALATE 10 MG PO TABS: 10.0000 mg | ORAL_TABLET | Freq: Every day | ORAL | 3 refills | Status: DC

## 2021-11-20 NOTE — Assessment & Plan Note (Signed)
Stable on omeprazole 40 mg daily.  Will refill today.

## 2021-11-20 NOTE — Assessment & Plan Note (Signed)
Stable on Lexapro 10 mg daily.

## 2021-11-20 NOTE — Assessment & Plan Note (Signed)
Check lipids.  Continue lifestyle modifications. 

## 2021-11-20 NOTE — Assessment & Plan Note (Signed)
At goal on amlodipine 2.5 mg daily.  Will refill today.

## 2021-11-20 NOTE — Assessment & Plan Note (Signed)
On Synthroid 25 mcg daily.  We will check TSH today.

## 2021-11-20 NOTE — Assessment & Plan Note (Signed)
Check A1c. 

## 2021-11-20 NOTE — Assessment & Plan Note (Signed)
Check CBC 

## 2021-11-20 NOTE — Assessment & Plan Note (Signed)
Check c-Met. 

## 2021-11-20 NOTE — Patient Instructions (Signed)
It was very nice to see you today!  I will refill your medications.  We will check blood work today.  We will see back in a year for your next visit.  Please come back to see [place] sooner if needed.  Take care, Dr Jerline Pain  PLEASE NOTE:  If you had any lab tests please let us know if you have not heard back within a few days. You may see your results on mychart before we have a chance to review them but we will give you a call once they are reviewed by [place]. If we ordered any referrals today, please let us know if you have not heard from their office within the next week.   Please try these tips to maintain a healthy lifestyle:  Eat at least 3 REAL meals and 1-2 snacks per day.  Aim for no more than 5 hours between eating.  If you eat breakfast, please do so within one hour of getting up.   Each meal should contain half fruits/vegetables, one quarter protein, and one quarter carbs (no bigger than a computer mouse)  Cut down on sweet beverages. This includes juice, soda, and sweet tea.   Drink at least 1 glass of water with each meal and aim for at least 8 glasses per day  Exercise at least 150 minutes every week.

## 2021-11-20 NOTE — Progress Notes (Signed)
   Jean Bowen is a 76 y.o. female who presents today for an office visit.  Assessment/Plan:  Chronic Problems Addressed Today: Pruritus Stable.  Uses hydroxyzine as needed.  Does not need refill today.  GERD Stable on omeprazole 40 mg daily.  Will refill today.  Essential hypertension At goal on amlodipine 2.5 mg daily.  Will refill today.  Major depressive disorder, single episode, in remission (HCC) Stable on Lexapro 10 mg daily.  HYPERCHOLESTEROLEMIA Check lipids.  Continue lifestyle modifications.  Hypothyroidism On Synthroid 25 mcg daily.  We will check TSH today.  Hyperparathyroidism ([place]) Check c-Met.  Thrombocytopenia (HCC) Check CBC.  Anemia Check CBC.  Hyperglycemia Check A1c.     Subjective:  HPI:  See A/p for status of chronic conditions.        Objective:  Physical Exam: BP 126/86   Pulse 74   Temp 97.9 F (36.6 C) (Temporal)   Ht _0  (1.702 m)   Wt 178 lb 3.2 oz (80.8 kg)   SpO2 98%   BMI 27.91 kg/m   Gen: No acute distress, resting comfortably CV: Regular rate and rhythm with no murmurs appreciated Pulm: Normal work of breathing, clear to auscultation bilaterally with no crackles, wheezes, or rhonchi Neuro: Grossly normal, moves all extremities Psych: Normal affect and thought content      Hason Ofarrell M. Jerline Pain, MD 11/20/2021 11:33 AM

## 2021-11-20 NOTE — Assessment & Plan Note (Signed)
Stable.  Uses hydroxyzine as needed.  Does not need refill today.

## 2021-11-23 NOTE — Progress Notes (Signed)
Please inform patient of the following:  Labs are all STABLE.  Would like for her to keep up the good work and we can recheck in a year.

## 2021-11-24 ENCOUNTER — Emergency Department (HOSPITAL_COMMUNITY): Payer: Medicare Other

## 2021-11-24 ENCOUNTER — Inpatient Hospital Stay (HOSPITAL_COMMUNITY)
Admission: EM | Admit: 2021-11-24 | Discharge: 2021-12-08 | DRG: 086 | Disposition: A | Discharge status: Skilled Nursing Facility | Payer: Medicare Other | Attending: Neurosurgery | Admitting: Neurosurgery

## 2021-11-24 DIAGNOSIS — K219 Gastro-esophageal reflux disease without esophagitis: Secondary | ICD-10-CM | POA: Diagnosis not present

## 2021-11-24 DIAGNOSIS — E7800 Pure hypercholesterolemia, unspecified: Secondary | ICD-10-CM | POA: Diagnosis present

## 2021-11-24 DIAGNOSIS — R402242 Coma scale, best verbal response, confused conversation, at arrival to emergency department: Secondary | ICD-10-CM | POA: Diagnosis present

## 2021-11-24 DIAGNOSIS — R0902 Hypoxemia: Secondary | ICD-10-CM | POA: Diagnosis not present

## 2021-11-24 DIAGNOSIS — S062X1A Diffuse traumatic brain injury with loss of consciousness of 30 minutes or less, initial encounter: Secondary | ICD-10-CM | POA: Diagnosis present

## 2021-11-24 DIAGNOSIS — S066X0A Traumatic subarachnoid hemorrhage without loss of consciousness, initial encounter: Secondary | ICD-10-CM | POA: Diagnosis not present

## 2021-11-24 DIAGNOSIS — E039 Hypothyroidism, unspecified: Secondary | ICD-10-CM | POA: Diagnosis not present

## 2021-11-24 DIAGNOSIS — W1830XA Fall on same level, unspecified, initial encounter: Secondary | ICD-10-CM | POA: Diagnosis present

## 2021-11-24 DIAGNOSIS — S065X0A Traumatic subdural hemorrhage without loss of consciousness, initial encounter: Secondary | ICD-10-CM | POA: Diagnosis not present

## 2021-11-24 DIAGNOSIS — R451 Restlessness and agitation: Secondary | ICD-10-CM | POA: Diagnosis not present

## 2021-11-24 DIAGNOSIS — S199XXA Unspecified injury of neck, initial encounter: Secondary | ICD-10-CM | POA: Diagnosis not present

## 2021-11-24 DIAGNOSIS — Z85828 Personal history of other malignant neoplasm of skin: Secondary | ICD-10-CM | POA: Diagnosis not present

## 2021-11-24 DIAGNOSIS — S065X1A Traumatic subdural hemorrhage with loss of consciousness of 30 minutes or less, initial encounter: Secondary | ICD-10-CM | POA: Diagnosis present

## 2021-11-24 DIAGNOSIS — Z9181 History of falling: Secondary | ICD-10-CM | POA: Diagnosis not present

## 2021-11-24 DIAGNOSIS — R410 Disorientation, unspecified: Secondary | ICD-10-CM | POA: Diagnosis not present

## 2021-11-24 DIAGNOSIS — S065XAA Traumatic subdural hemorrhage with loss of consciousness status unknown, initial encounter: Secondary | ICD-10-CM

## 2021-11-24 DIAGNOSIS — Z8249 Family history of ischemic heart disease and other diseases of the circulatory system: Secondary | ICD-10-CM

## 2021-11-24 DIAGNOSIS — S0101XA Laceration without foreign body of scalp, initial encounter: Secondary | ICD-10-CM | POA: Diagnosis present

## 2021-11-24 DIAGNOSIS — J9811 Atelectasis: Secondary | ICD-10-CM | POA: Diagnosis present

## 2021-11-24 DIAGNOSIS — E213 Hyperparathyroidism, unspecified: Secondary | ICD-10-CM | POA: Diagnosis present

## 2021-11-24 DIAGNOSIS — F325 Major depressive disorder, single episode, in full remission: Secondary | ICD-10-CM | POA: Diagnosis present

## 2021-11-24 DIAGNOSIS — Z751 Person awaiting admission to adequate facility elsewhere: Secondary | ICD-10-CM

## 2021-11-24 DIAGNOSIS — I10 Essential (primary) hypertension: Secondary | ICD-10-CM | POA: Diagnosis present

## 2021-11-24 DIAGNOSIS — S06344A Traumatic hemorrhage of right cerebrum with loss of consciousness of 6 hours to 24 hours, initial encounter: Secondary | ICD-10-CM | POA: Diagnosis not present

## 2021-11-24 DIAGNOSIS — R402362 Coma scale, best motor response, obeys commands, at arrival to emergency department: Secondary | ICD-10-CM | POA: Diagnosis present

## 2021-11-24 DIAGNOSIS — R4701 Aphasia: Secondary | ICD-10-CM | POA: Diagnosis present

## 2021-11-24 DIAGNOSIS — R4182 Altered mental status, unspecified: Secondary | ICD-10-CM | POA: Diagnosis not present

## 2021-11-24 DIAGNOSIS — S06360A Traumatic hemorrhage of cerebrum, unspecified, without loss of consciousness, initial encounter: Secondary | ICD-10-CM | POA: Diagnosis not present

## 2021-11-24 DIAGNOSIS — S0003XA Contusion of scalp, initial encounter: Secondary | ICD-10-CM | POA: Diagnosis not present

## 2021-11-24 DIAGNOSIS — S0636AA Traumatic hemorrhage of cerebrum, unspecified, with loss of consciousness status unknown, initial encounter: Secondary | ICD-10-CM | POA: Diagnosis not present

## 2021-11-24 DIAGNOSIS — Z888 Allergy status to other drugs, medicaments and biological substances status: Secondary | ICD-10-CM

## 2021-11-24 DIAGNOSIS — R911 Solitary pulmonary nodule: Secondary | ICD-10-CM | POA: Diagnosis not present

## 2021-11-24 DIAGNOSIS — D649 Anemia, unspecified: Secondary | ICD-10-CM | POA: Diagnosis present

## 2021-11-24 DIAGNOSIS — R000 Tachycardia, unspecified: Secondary | ICD-10-CM | POA: Diagnosis not present

## 2021-11-24 DIAGNOSIS — G9389 Other specified disorders of brain: Secondary | ICD-10-CM | POA: Diagnosis not present

## 2021-11-24 DIAGNOSIS — Z808 Family history of malignant neoplasm of other organs or systems: Secondary | ICD-10-CM | POA: Diagnosis not present

## 2021-11-24 DIAGNOSIS — F32A Depression, unspecified: Secondary | ICD-10-CM | POA: Diagnosis not present

## 2021-11-24 DIAGNOSIS — M810 Age-related osteoporosis without current pathological fracture: Secondary | ICD-10-CM | POA: Diagnosis present

## 2021-11-24 DIAGNOSIS — Z885 Allergy status to narcotic agent status: Secondary | ICD-10-CM

## 2021-11-24 DIAGNOSIS — R41841 Cognitive communication deficit: Secondary | ICD-10-CM | POA: Diagnosis not present

## 2021-11-24 DIAGNOSIS — S065XAD Traumatic subdural hemorrhage with loss of consciousness status unknown, subsequent encounter: Secondary | ICD-10-CM | POA: Diagnosis not present

## 2021-11-24 DIAGNOSIS — W19XXXA Unspecified fall, initial encounter: Principal | ICD-10-CM

## 2021-11-24 DIAGNOSIS — G9340 Encephalopathy, unspecified: Secondary | ICD-10-CM | POA: Diagnosis not present

## 2021-11-24 DIAGNOSIS — R2681 Unsteadiness on feet: Secondary | ICD-10-CM | POA: Diagnosis not present

## 2021-11-24 DIAGNOSIS — J9601 Acute respiratory failure with hypoxia: Secondary | ICD-10-CM | POA: Diagnosis not present

## 2021-11-24 DIAGNOSIS — S06310A Contusion and laceration of right cerebrum without loss of consciousness, initial encounter: Secondary | ICD-10-CM | POA: Diagnosis not present

## 2021-11-24 DIAGNOSIS — S0291XD Unspecified fracture of skull, subsequent encounter for fracture with routine healing: Secondary | ICD-10-CM | POA: Diagnosis not present

## 2021-11-24 DIAGNOSIS — I700 Atherosclerosis of aorta: Secondary | ICD-10-CM | POA: Diagnosis not present

## 2021-11-24 DIAGNOSIS — R1111 Vomiting without nausea: Secondary | ICD-10-CM | POA: Diagnosis not present

## 2021-11-24 DIAGNOSIS — S0990XA Unspecified injury of head, initial encounter: Secondary | ICD-10-CM | POA: Diagnosis not present

## 2021-11-24 DIAGNOSIS — G4733 Obstructive sleep apnea (adult) (pediatric): Secondary | ICD-10-CM | POA: Diagnosis present

## 2021-11-24 DIAGNOSIS — S02119A Unspecified fracture of occiput, initial encounter for closed fracture: Secondary | ICD-10-CM | POA: Diagnosis not present

## 2021-11-24 DIAGNOSIS — M6281 Muscle weakness (generalized): Secondary | ICD-10-CM | POA: Diagnosis not present

## 2021-11-24 DIAGNOSIS — S06320A Contusion and laceration of left cerebrum without loss of consciousness, initial encounter: Secondary | ICD-10-CM | POA: Diagnosis not present

## 2021-11-24 DIAGNOSIS — M47812 Spondylosis without myelopathy or radiculopathy, cervical region: Secondary | ICD-10-CM | POA: Diagnosis not present

## 2021-11-24 DIAGNOSIS — S0636AD Traumatic hemorrhage of cerebrum, unspecified, with loss of consciousness status unknown, subsequent encounter: Secondary | ICD-10-CM | POA: Diagnosis not present

## 2021-11-24 DIAGNOSIS — Z87891 Personal history of nicotine dependence: Secondary | ICD-10-CM

## 2021-11-24 DIAGNOSIS — S0211GA Other fracture of occiput, right side, initial encounter for closed fracture: Secondary | ICD-10-CM

## 2021-11-24 DIAGNOSIS — N3281 Overactive bladder: Secondary | ICD-10-CM | POA: Diagnosis not present

## 2021-11-24 DIAGNOSIS — S066X1A Traumatic subarachnoid hemorrhage with loss of consciousness of 30 minutes or less, initial encounter: Secondary | ICD-10-CM | POA: Diagnosis present

## 2021-11-24 DIAGNOSIS — I712 Thoracic aortic aneurysm, without rupture, unspecified: Secondary | ICD-10-CM

## 2021-11-24 DIAGNOSIS — S02401A Maxillary fracture, unspecified, initial encounter for closed fracture: Secondary | ICD-10-CM | POA: Diagnosis not present

## 2021-11-24 DIAGNOSIS — R402142 Coma scale, eyes open, spontaneous, at arrival to emergency department: Secondary | ICD-10-CM | POA: Diagnosis present

## 2021-11-24 DIAGNOSIS — Z7401 Bed confinement status: Secondary | ICD-10-CM | POA: Diagnosis not present

## 2021-11-24 MED ORDER — SODIUM CHLORIDE 0.9 % IV BOLUS (SEPSIS)
1000.0000 mL | Freq: Once | INTRAVENOUS | Status: AC
  Administered 2021-11-24: 1000 mL via INTRAVENOUS

## 2021-11-24 MED ORDER — ONDANSETRON HCL 4 MG/2ML IJ SOLN
4.0000 mg | Freq: Once | INTRAMUSCULAR | Status: AC
  Administered 2021-11-24: 4 mg via INTRAVENOUS

## 2021-11-24 MED ORDER — SODIUM CHLORIDE 0.9 % IV SOLN
1000.0000 mL | INTRAVENOUS | Status: DC
  Administered 2021-11-25: 1000 mL via INTRAVENOUS

## 2021-11-24 NOTE — ED Notes (Signed)
Patient transported to CT 

## 2021-11-25 ENCOUNTER — Emergency Department (HOSPITAL_COMMUNITY): Payer: Medicare Other

## 2021-11-25 ENCOUNTER — Telehealth: Payer: Self-pay | Admitting: Pulmonary Disease

## 2021-11-25 ENCOUNTER — Other Ambulatory Visit (HOSPITAL_COMMUNITY): Payer: Medicare Other

## 2021-11-25 DIAGNOSIS — E213 Hyperparathyroidism, unspecified: Secondary | ICD-10-CM | POA: Diagnosis present

## 2021-11-25 DIAGNOSIS — S066X0A Traumatic subarachnoid hemorrhage without loss of consciousness, initial encounter: Secondary | ICD-10-CM | POA: Diagnosis not present

## 2021-11-25 DIAGNOSIS — Z808 Family history of malignant neoplasm of other organs or systems: Secondary | ICD-10-CM | POA: Diagnosis not present

## 2021-11-25 DIAGNOSIS — E039 Hypothyroidism, unspecified: Secondary | ICD-10-CM | POA: Diagnosis present

## 2021-11-25 DIAGNOSIS — N3281 Overactive bladder: Secondary | ICD-10-CM | POA: Diagnosis not present

## 2021-11-25 DIAGNOSIS — G9340 Encephalopathy, unspecified: Secondary | ICD-10-CM | POA: Diagnosis not present

## 2021-11-25 DIAGNOSIS — D649 Anemia, unspecified: Secondary | ICD-10-CM | POA: Diagnosis present

## 2021-11-25 DIAGNOSIS — S06320A Contusion and laceration of left cerebrum without loss of consciousness, initial encounter: Secondary | ICD-10-CM | POA: Diagnosis not present

## 2021-11-25 DIAGNOSIS — R911 Solitary pulmonary nodule: Secondary | ICD-10-CM | POA: Diagnosis not present

## 2021-11-25 DIAGNOSIS — S065X0A Traumatic subdural hemorrhage without loss of consciousness, initial encounter: Secondary | ICD-10-CM | POA: Diagnosis not present

## 2021-11-25 DIAGNOSIS — S065XAA Traumatic subdural hemorrhage with loss of consciousness status unknown, initial encounter: Secondary | ICD-10-CM | POA: Diagnosis present

## 2021-11-25 DIAGNOSIS — S066X1A Traumatic subarachnoid hemorrhage with loss of consciousness of 30 minutes or less, initial encounter: Secondary | ICD-10-CM | POA: Diagnosis present

## 2021-11-25 DIAGNOSIS — S06344A Traumatic hemorrhage of right cerebrum with loss of consciousness of 6 hours to 24 hours, initial encounter: Secondary | ICD-10-CM

## 2021-11-25 DIAGNOSIS — S0636AD Traumatic hemorrhage of cerebrum, unspecified, with loss of consciousness status unknown, subsequent encounter: Secondary | ICD-10-CM | POA: Diagnosis not present

## 2021-11-25 DIAGNOSIS — R4701 Aphasia: Secondary | ICD-10-CM | POA: Diagnosis present

## 2021-11-25 DIAGNOSIS — S02119A Unspecified fracture of occiput, initial encounter for closed fracture: Secondary | ICD-10-CM | POA: Diagnosis present

## 2021-11-25 DIAGNOSIS — F325 Major depressive disorder, single episode, in full remission: Secondary | ICD-10-CM | POA: Diagnosis present

## 2021-11-25 DIAGNOSIS — Z85828 Personal history of other malignant neoplasm of skin: Secondary | ICD-10-CM | POA: Diagnosis not present

## 2021-11-25 DIAGNOSIS — R0902 Hypoxemia: Secondary | ICD-10-CM | POA: Diagnosis not present

## 2021-11-25 DIAGNOSIS — J9811 Atelectasis: Secondary | ICD-10-CM | POA: Diagnosis present

## 2021-11-25 DIAGNOSIS — S0101XA Laceration without foreign body of scalp, initial encounter: Secondary | ICD-10-CM | POA: Diagnosis present

## 2021-11-25 DIAGNOSIS — Z888 Allergy status to other drugs, medicaments and biological substances status: Secondary | ICD-10-CM | POA: Diagnosis not present

## 2021-11-25 DIAGNOSIS — I700 Atherosclerosis of aorta: Secondary | ICD-10-CM | POA: Diagnosis not present

## 2021-11-25 DIAGNOSIS — Z7401 Bed confinement status: Secondary | ICD-10-CM | POA: Diagnosis not present

## 2021-11-25 DIAGNOSIS — J9601 Acute respiratory failure with hypoxia: Secondary | ICD-10-CM | POA: Diagnosis not present

## 2021-11-25 DIAGNOSIS — R402142 Coma scale, eyes open, spontaneous, at arrival to emergency department: Secondary | ICD-10-CM | POA: Diagnosis present

## 2021-11-25 DIAGNOSIS — S06310A Contusion and laceration of right cerebrum without loss of consciousness, initial encounter: Secondary | ICD-10-CM | POA: Diagnosis not present

## 2021-11-25 DIAGNOSIS — S065XAD Traumatic subdural hemorrhage with loss of consciousness status unknown, subsequent encounter: Secondary | ICD-10-CM | POA: Diagnosis not present

## 2021-11-25 DIAGNOSIS — R4182 Altered mental status, unspecified: Secondary | ICD-10-CM | POA: Diagnosis not present

## 2021-11-25 DIAGNOSIS — Z9181 History of falling: Secondary | ICD-10-CM | POA: Diagnosis not present

## 2021-11-25 DIAGNOSIS — S0636AA Traumatic hemorrhage of cerebrum, unspecified, with loss of consciousness status unknown, initial encounter: Secondary | ICD-10-CM | POA: Diagnosis not present

## 2021-11-25 DIAGNOSIS — W1830XA Fall on same level, unspecified, initial encounter: Secondary | ICD-10-CM | POA: Diagnosis present

## 2021-11-25 DIAGNOSIS — R41841 Cognitive communication deficit: Secondary | ICD-10-CM | POA: Diagnosis not present

## 2021-11-25 DIAGNOSIS — S06360A Traumatic hemorrhage of cerebrum, unspecified, without loss of consciousness, initial encounter: Secondary | ICD-10-CM | POA: Diagnosis not present

## 2021-11-25 DIAGNOSIS — G4733 Obstructive sleep apnea (adult) (pediatric): Secondary | ICD-10-CM | POA: Diagnosis present

## 2021-11-25 DIAGNOSIS — K219 Gastro-esophageal reflux disease without esophagitis: Secondary | ICD-10-CM | POA: Diagnosis present

## 2021-11-25 DIAGNOSIS — E7800 Pure hypercholesterolemia, unspecified: Secondary | ICD-10-CM | POA: Diagnosis present

## 2021-11-25 DIAGNOSIS — G9389 Other specified disorders of brain: Secondary | ICD-10-CM | POA: Diagnosis not present

## 2021-11-25 DIAGNOSIS — I712 Thoracic aortic aneurysm, without rupture, unspecified: Secondary | ICD-10-CM

## 2021-11-25 DIAGNOSIS — Z885 Allergy status to narcotic agent status: Secondary | ICD-10-CM | POA: Diagnosis not present

## 2021-11-25 DIAGNOSIS — R402242 Coma scale, best verbal response, confused conversation, at arrival to emergency department: Secondary | ICD-10-CM | POA: Diagnosis present

## 2021-11-25 DIAGNOSIS — R402362 Coma scale, best motor response, obeys commands, at arrival to emergency department: Secondary | ICD-10-CM | POA: Diagnosis present

## 2021-11-25 DIAGNOSIS — F32A Depression, unspecified: Secondary | ICD-10-CM | POA: Diagnosis not present

## 2021-11-25 DIAGNOSIS — S065X1A Traumatic subdural hemorrhage with loss of consciousness of 30 minutes or less, initial encounter: Secondary | ICD-10-CM | POA: Diagnosis present

## 2021-11-25 DIAGNOSIS — S02401A Maxillary fracture, unspecified, initial encounter for closed fracture: Secondary | ICD-10-CM | POA: Diagnosis not present

## 2021-11-25 DIAGNOSIS — I10 Essential (primary) hypertension: Secondary | ICD-10-CM | POA: Diagnosis present

## 2021-11-25 DIAGNOSIS — M47812 Spondylosis without myelopathy or radiculopathy, cervical region: Secondary | ICD-10-CM | POA: Diagnosis not present

## 2021-11-25 DIAGNOSIS — S199XXA Unspecified injury of neck, initial encounter: Secondary | ICD-10-CM | POA: Diagnosis not present

## 2021-11-25 DIAGNOSIS — S0003XA Contusion of scalp, initial encounter: Secondary | ICD-10-CM | POA: Diagnosis not present

## 2021-11-25 DIAGNOSIS — M6281 Muscle weakness (generalized): Secondary | ICD-10-CM | POA: Diagnosis not present

## 2021-11-25 DIAGNOSIS — R2681 Unsteadiness on feet: Secondary | ICD-10-CM | POA: Diagnosis not present

## 2021-11-25 DIAGNOSIS — M810 Age-related osteoporosis without current pathological fracture: Secondary | ICD-10-CM | POA: Diagnosis present

## 2021-11-25 DIAGNOSIS — S0291XD Unspecified fracture of skull, subsequent encounter for fracture with routine healing: Secondary | ICD-10-CM | POA: Diagnosis not present

## 2021-11-25 DIAGNOSIS — S062X1A Diffuse traumatic brain injury with loss of consciousness of 30 minutes or less, initial encounter: Secondary | ICD-10-CM | POA: Diagnosis present

## 2021-11-25 LAB — GLUCOSE, CAPILLARY
Glucose-Capillary: 140 mg/dL — ABNORMAL HIGH (ref 70–99)
Glucose-Capillary: 163 mg/dL — ABNORMAL HIGH (ref 70–99)
Glucose-Capillary: 165 mg/dL — ABNORMAL HIGH (ref 70–99)
Glucose-Capillary: 170 mg/dL — ABNORMAL HIGH (ref 70–99)
Glucose-Capillary: 170 mg/dL — ABNORMAL HIGH (ref 70–99)

## 2021-11-25 LAB — CBC WITH DIFFERENTIAL/PLATELET
Abs Immature Granulocytes: 0.17 10*3/uL — ABNORMAL HIGH (ref 0.00–0.07)
Basophils Absolute: 0.1 10*3/uL (ref 0.0–0.1)
Basophils Relative: 1 %
Eosinophils Absolute: 0.4 10*3/uL (ref 0.0–0.5)
Eosinophils Relative: 4 %
HCT: 44.5 % (ref 36.0–46.0)
Hemoglobin: 14.6 g/dL (ref 12.0–15.0)
Immature Granulocytes: 2 %
Lymphocytes Relative: 38 %
Lymphs Abs: 3.7 10*3/uL (ref 0.7–4.0)
MCH: 30.7 pg (ref 26.0–34.0)
MCHC: 32.8 g/dL (ref 30.0–36.0)
MCV: 93.5 fL (ref 80.0–100.0)
Monocytes Absolute: 1 10*3/uL (ref 0.1–1.0)
Monocytes Relative: 11 %
Neutro Abs: 4.2 10*3/uL (ref 1.7–7.7)
Neutrophils Relative %: 44 %
Platelets: 297 10*3/uL (ref 150–400)
RBC: 4.76 MIL/uL (ref 3.87–5.11)
RDW: 13.2 % (ref 11.5–15.5)
WBC: 9.6 10*3/uL (ref 4.0–10.5)
nRBC: 0 % (ref 0.0–0.2)

## 2021-11-25 LAB — COMPREHENSIVE METABOLIC PANEL
ALT: 25 U/L (ref 0–44)
AST: 39 U/L (ref 15–41)
Albumin: 3.9 g/dL (ref 3.5–5.0)
Alkaline Phosphatase: 69 U/L (ref 38–126)
Anion gap: 12 (ref 5–15)
BUN: 16 mg/dL (ref 8–23)
CO2: 21 mmol/L — ABNORMAL LOW (ref 22–32)
Calcium: 8.4 mg/dL — ABNORMAL LOW (ref 8.9–10.3)
Chloride: 105 mmol/L (ref 98–111)
Creatinine, Ser: 0.66 mg/dL (ref 0.44–1.00)
GFR, Estimated: >60 mL/min (ref >60)
Glucose, Bld: 157 mg/dL — ABNORMAL HIGH (ref 70–99)
Potassium: 5.1 mmol/L (ref 3.5–5.1)
Sodium: 138 mmol/L (ref 135–145)
Total Bilirubin: 0.8 mg/dL (ref 0.3–1.2)
Total Protein: 6.5 g/dL (ref 6.5–8.1)

## 2021-11-25 LAB — PROTIME-INR
INR: 0.9 (ref 0.8–1.2)
Prothrombin Time: 12.5 seconds (ref 11.4–15.2)

## 2021-11-25 LAB — ETHANOL: Alcohol, Ethyl (B): <10 mg/dL (ref <10)

## 2021-11-25 LAB — MRSA NEXT GEN BY PCR, NASAL: MRSA by PCR Next Gen: NOT DETECTED

## 2021-11-25 MED ORDER — ONDANSETRON HCL 4 MG/2ML IJ SOLN
4.0000 mg | Freq: Four times a day (QID) | INTRAMUSCULAR | Status: DC | PRN
  Administered 2021-11-26: 4 mg via INTRAVENOUS
  Filled 2021-11-25: qty 2

## 2021-11-25 MED ORDER — LEVOTHYROXINE SODIUM 25 MCG PO TABS
25.0000 ug | ORAL_TABLET | Freq: Every day | ORAL | Status: DC
  Administered 2021-11-26 – 2021-12-08 (×13): 25 ug via ORAL
  Filled 2021-11-25 (×14): qty 1

## 2021-11-25 MED ORDER — CHLORHEXIDINE GLUCONATE CLOTH 2 % EX PADS
6.0000 | MEDICATED_PAD | Freq: Every day | CUTANEOUS | Status: DC
Start: 2021-11-25 — End: 2021-11-30
  Administered 2021-11-25 – 2021-11-26 (×2): 6 via TOPICAL

## 2021-11-25 MED ORDER — PANTOPRAZOLE SODIUM 40 MG PO TBEC
40.0000 mg | DELAYED_RELEASE_TABLET | Freq: Two times a day (BID) | ORAL | Status: DC
  Administered 2021-11-26 – 2021-12-08 (×25): 40 mg via ORAL
  Filled 2021-11-25 (×25): qty 1

## 2021-11-25 MED ORDER — ACYCLOVIR 400 MG PO TABS
800.0000 mg | ORAL_TABLET | Freq: Every day | ORAL | Status: DC
  Administered 2021-11-26 – 2021-12-08 (×13): 800 mg via ORAL
  Filled 2021-11-25 (×13): qty 2

## 2021-11-25 MED ORDER — ESCITALOPRAM OXALATE 10 MG PO TABS
10.0000 mg | ORAL_TABLET | Freq: Every day | ORAL | Status: DC
  Administered 2021-11-26 – 2021-12-08 (×13): 10 mg via ORAL
  Filled 2021-11-25 (×13): qty 1

## 2021-11-25 MED ORDER — HYDROCODONE-ACETAMINOPHEN 5-325 MG PO TABS
1.0000 | ORAL_TABLET | ORAL | Status: DC | PRN
  Administered 2021-11-26 – 2021-12-07 (×11): 1 via ORAL
  Filled 2021-11-25 (×12): qty 1

## 2021-11-25 MED ORDER — AMLODIPINE BESYLATE 5 MG PO TABS
2.5000 mg | ORAL_TABLET | Freq: Every day | ORAL | Status: DC
  Filled 2021-11-25: qty 1

## 2021-11-25 MED ORDER — CLEVIDIPINE BUTYRATE 0.5 MG/ML IV EMUL
0.0000 mg/h | INTRAVENOUS | Status: DC
  Administered 2021-11-25: 2 mg/h via INTRAVENOUS
  Administered 2021-11-25: 6 mg/h via INTRAVENOUS
  Administered 2021-11-25: 8 mg/h via INTRAVENOUS
  Administered 2021-11-25: 4 mg/h via INTRAVENOUS
  Administered 2021-11-26: 8 mg/h via INTRAVENOUS
  Administered 2021-11-26: 10 mg/h via INTRAVENOUS
  Administered 2021-11-26: 6 mg/h via INTRAVENOUS
  Filled 2021-11-25 (×9): qty 50

## 2021-11-25 MED ORDER — IOHEXOL 300 MG/ML  SOLN
75.0000 mL | Freq: Once | INTRAMUSCULAR | Status: AC | PRN
  Administered 2021-11-25: 75 mL via INTRAVENOUS

## 2021-11-25 MED ORDER — LABETALOL HCL 5 MG/ML IV SOLN
20.0000 mg | Freq: Once | INTRAVENOUS | Status: AC
  Administered 2021-11-25: 20 mg via INTRAVENOUS
  Filled 2021-11-25: qty 4

## 2021-11-25 MED ORDER — POTASSIUM CHLORIDE IN NACL 20-0.9 MEQ/L-% IV SOLN
INTRAVENOUS | Status: DC
  Filled 2021-11-25 (×12): qty 1000

## 2021-11-25 MED ORDER — HYDROXYZINE HCL 10 MG PO TABS
10.0000 mg | ORAL_TABLET | Freq: Three times a day (TID) | ORAL | Status: DC | PRN
  Administered 2021-11-28 – 2021-12-01 (×4): 10 mg via ORAL
  Filled 2021-11-25 (×5): qty 1

## 2021-11-25 MED ORDER — ACETAMINOPHEN 325 MG PO TABS
650.0000 mg | ORAL_TABLET | ORAL | Status: DC | PRN
  Administered 2021-11-25 – 2021-12-08 (×3): 650 mg via ORAL
  Filled 2021-11-25 (×3): qty 2

## 2021-11-25 MED ORDER — ONDANSETRON HCL 4 MG PO TABS: 4.0000 mg | ORAL_TABLET | Freq: Four times a day (QID) | ORAL | Status: DC | PRN

## 2021-11-25 MED ORDER — PANTOPRAZOLE SODIUM 40 MG PO TBEC: 80.0000 mg | DELAYED_RELEASE_TABLET | Freq: Every day | ORAL | Status: DC

## 2021-11-25 MED ORDER — SODIUM CHLORIDE 0.9 % IV SOLN: INTRAVENOUS | Status: DC

## 2021-11-25 NOTE — Progress Notes (Signed)
eLink Physician-Brief Progress Note Patient Name: Jean Bowen DOB: 01-24-1946 MRN: [id number]   Date of Service  11/25/2021  HPI/Events of Note  Patient admitted by neurosurgery for traumatic intraparenchymal / subarachnoid hemorrhage, PCCM consulted for critical care management.  eICU Interventions  New Patient Evaluation.        Kerry Kass Jozey Janco 11/25/2021, 5:54 AM

## 2021-11-25 NOTE — Telephone Encounter (Signed)
Needs hospital follow up with Jean Bowen: hypoxemia, smoker, abnormal CT chest; timeframe 4-6 weeks.

## 2021-11-25 NOTE — Progress Notes (Signed)
Patient ID: Jean Bowen, female   DOB: 04-17-1946, 76 y.o.   MRN: [id number] BP (!) 150/90 (BP Location: Right Arm)   Pulse (!) 108   Temp 98.7 F (37.1 C) (Oral)   Resp 18   Ht '5\' 7"'$  (1.702 m)   Wt 80 kg   SpO2 95%   BMI 27.62 kg/m  Alert oriented to person, place Following commands Moving all extremities Will repeat head ct tomorrow Doing fairly well

## 2021-11-25 NOTE — ED Notes (Signed)
Pt placed on blow by O2 via NRB due to sats dropping to 83%

## 2021-11-25 NOTE — ED Notes (Signed)
Report called to Beverlee Nims, RN on 4N

## 2021-11-25 NOTE — Consult Note (Signed)
NAME:  Jean Bowen, MRN:  [id number], DOB:  1945-09-29, LOS: 0 ADMISSION DATE:  11/24/2021, CONSULTATION DATE:  8/9 REFERRING MD:  Christella Noa, CHIEF COMPLAINT:  hypoxia and critical care medicine support    History of Present Illness:   76 year old female admitted by neuro-surg on 8/8 after being found after a fall. Unfortunately she does not remember the event, and current documentation does not describe the details of presentation (not clear where she was, who found her, details leading up to event). The only thing we know is this was an unwitnessed event and presume from fall. On EMS arrival bleeding from mouth and nose. BP 180s/120s.  On arrival to ER. Hypoxic in 80s requiring supplemental oxygen  Trauma imaging: CT head: (R) occiput skull fx, (L) frontal SDH w/ (L) temporal contusion. CT chest notable to bronchial thickening and mild basilar BTX, did have 4 mm nodule LUL, but no acute findings. Cervical spine CT (-) for traumatic injury. CT maxillofacial:(-) for fx but had blood t/o the sphenoid and maxillary sinus.    Pertinent  Medical History  Major depressive disorder Arthritis  HTN HL Hypothyroidism  Hyperparathyroidism  Anemia hyperglycemia Skin cancer   Significant Hospital Events: Including procedures, antibiotic start and stop dates in addition to other pertinent events   8/8 admitted after being found altered after what appears to have been fall resulting in R) occiput skull fx, (L) frontal SDH w/ (L) temporal contusion. Admitted by neuro surg for obs. Hypoxic on presentation. PCCM asked to eval   Interim History / Subjective:  Sleepy but  oriented. Does fall off to sleep during conversation   Objective   Blood pressure (Abnormal) 141/83, pulse 98, temperature (Abnormal) 97.5 F (36.4 C), temperature source Oral, resp. rate 18, height '5\' 7"'$  (1.702 m), weight 80 kg, SpO2 96 %.        Intake/Output Summary (Last 24 hours) at 11/25/2021 0919 Last data filed at 11/25/2021  0800 Gross per 24 hour  Intake 706.92 ml  Output 1100 ml  Net -393.08 ml   Filed Weights   11/24/21 2340  Weight: 80 kg    Examination: General: 76 year old female resting in bed  HENT: NCAT no JVD  Lungs: clear no accessory use O2 sats 96% 4 lpm Cardiovascular: RRR w/out MRG Abdomen: soft not tender  Extremities: warm and dry  Neuro: easy to arouse. Once awake oriented X 3 but does drift back to sleep during conversation. Moves all ext. No clear focal def  GU: due to void   Resolved Hospital Problem list     Assessment & Plan:   Principal Problem:   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[place]) Active Problems:   Hypoxia   Essential hypertension   Hypothyroidism   HYPERCHOLESTEROLEMIA   Major depressive disorder, single episode, in remission ([place])   GERD   Hyperparathyroidism ([place])   Anemia   Nodule of upper lobe of left lung   Aortic aneurysm, thoracic (HCC)   Traumatic Right Occiput skull fx, Acute left frontal SDH, left temporal contusion -presumed from fall. Unfortunately we have no details of event leading up to presentation. No hx. Only person on record is Neighbor, There is office visit as recently as 5 d ago no mention of cognitive concerns Plan Serial Neuro checks SBP goal < 140 Bedside swallow eval when OK w/ surg  Hypoxia.  CT chest w/ mild basilar BTX and some bronchial thickening. I wonder if hypoxia at time was more d/t decreased Mental status. She  does a little dependent atx. Nothing that looks like COPD but does have smoking hx Plan Wean oxygen  Pulse Ox IS  4 mm LUL lung nodule (incidental finding)  Plan Needs f/u CT 1 year   Aortic aneurysm 4.1 cm  Plan Needs yearly evaluation via MRA or CTA  HTN Plan Cont cleviprex for SBP goal < 140 Add oral norvasc when clear to do so  H/o hypothyroidism  Plan Synthroid   H/o major depression Plan Resume lexapro when able   Best Practice (right click and "Reselect all SmartList Selections"  daily)   Diet/type: NPO DVT prophylaxis: SCD GI prophylaxis: N/A Lines: N/A Foley:  N/A Code Status:  full code Last date of multidisciplinary goals of care discussion [pending]  Labs   CBC: Recent Labs  Lab 11/20/21 1144 11/24/21 2333  WBC 5.1 9.6  NEUTROABS  --  4.2  HGB 13.9 14.6  HCT 41.4 44.5  MCV 92.0 93.5  PLT 235.0 323    Basic Metabolic Panel: Recent Labs  Lab 11/20/21 1144 11/25/21 0054  NA 138 138  K 4.0 5.1  CL 102 105  CO2 33* 21*  GLUCOSE 99 157*  BUN 15 16  CREATININE 0.66 0.66  CALCIUM 8.9 8.4*   GFR: Estimated Creatinine Clearance: 65.2 mL/min (by C-G formula based on SCr of 0.66 mg/dL). Recent Labs  Lab 11/20/21 1144 11/24/21 2333  WBC 5.1 9.6    Liver Function Tests: Recent Labs  Lab 11/20/21 1144 11/25/21 0054  AST 18 39  ALT 21 25  ALKPHOS 65 69  BILITOT 0.5 0.8  PROT 7.0 6.5  ALBUMIN 4.1 3.9   No results for input(s): "LIPASE", "AMYLASE" in the last 168 hours. No results for input(s): "AMMONIA" in the last 168 hours.  ABG No results found for: "PHART", "PCO2ART", "PO2ART", "HCO3", "TCO2", "ACIDBASEDEF", "O2SAT"   Coagulation Profile: Recent Labs  Lab 11/24/21 2333  INR 0.9    Cardiac Enzymes: No results for input(s): "CKTOTAL", "CKMB", "CKMBINDEX", "TROPONINI" in the last 168 hours.  HbA1C: Hgb A1c MFr Bld  Date/Time Value Ref Range Status  11/20/2021 11:44 AM 5.8 4.6 - 6.5 % Final    Comment:    Glycemic Control Guidelines for People with Diabetes:Non Diabetic:  <6%Goal of Therapy: <7%Additional Action Suggested:  >8%   08/21/2020 03:57 PM 5.8 4.6 - 6.5 % Final    Comment:    Glycemic Control Guidelines for People with Diabetes:Non Diabetic:  <6%Goal of Therapy: <7%Additional Action Suggested:  >8%     CBG: Recent Labs  Lab 11/25/21 0822  GLUCAP 163*    Review of Systems:   Not able   Past Medical History:  She,  has a past medical history of ANGIOEDEMA (08/22/09), DEPRESSION (12/03/2006), DERMATITIS,  PERIORAL (01/08/2010), Diverticulosis, GERD (03/03/2007), Hiatal hernia, History of benign gastric tumor, HYPERCHOLESTEROLEMIA (02/23/2010), Hyperplastic colon polyp (12/03/2006), HYPERTENSION (12/03/2006), OSA (obstructive sleep apnea), OSTEOPOROSIS (12/03/2006), Sleep apnea, and URINARY INCONTINENCE (12/03/2006).   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 Her smoking use included cigarettes. She has a 60.00 pack-year smoking history. She has never used smokeless tobacco. She reports current alcohol use of about 3.0 standard drinks of alcohol per week. She reports that she does not use drugs.   Family History:  Her family history includes Arthritis in her father; Colon cancer in her father; Diabetes in an other family member; Heart attack in an other family member; Lung cancer in her brother and mother; Melanoma in her mother.   Allergies Allergies  Allergen Reactions   Doxycycline     REACTION: swollen lips and throat   Losartan Potassium     REACTION: ? of angioedema   Morphine And Related     Caused vomiting when given too fast in IV.     Home Medications  Prior to Admission medications   Medication Sig Start Date End Date Taking? Authorizing Provider  acetaminophen (TYLENOL) 325 MG tablet Take 650 mg by mouth every 6 (six) hours as needed.    [provider]  acyclovir (ZOVIRAX) 400 MG tablet Take 2 tablets (800 mg total) by mouth daily. TAKE TWO  TABLETS BY MOUTH EVERY 4 HOURS 11/20/21   Vivi Barrack, MD  amLODipine (NORVASC) 2.5 MG tablet TAKE ONE TABLET BY MOUTH DAILY 10/19/21   Vivi Barrack, MD  escitalopram (LEXAPRO) 10 MG tablet Take 1 tablet (10 mg total) by mouth daily. 11/20/21   Vivi Barrack, MD  hydrOXYzine (ATARAX/VISTARIL) 10 MG tablet Take 1 tablet (10 mg total) by mouth 3 (three) times daily as needed for itching. 08/21/20   Vivi Barrack, MD  levothyroxine (SYNTHROID) 25 MCG tablet Take 1 tablet (25 mcg total) by mouth daily with breakfast. 11/20/21   Vivi Barrack, MD  omeprazole (PRILOSEC) 40 MG capsule Take 1 capsule (40 mg total) by mouth daily. 10/16/14   Lafayette Dragon, MD  oxybutynin (DITROPAN) 5 MG tablet Take 1 tablet (5 mg total) by mouth 3 (three) times daily. 11/20/21   Vivi Barrack, MD     Critical care time: NA   Erick Colace ACNP-BC [place] Pager # [phone] OR # [phone] if no answer

## 2021-11-25 NOTE — Progress Notes (Signed)
Inpatient Rehab Admissions Coordinator:    I met with pt. To discuss potential CIR admit. She is interested but states she is not sure she will have help when she goes home. Her neighbor, Barnabas Lister, is the only contact listed in her chart. His number is disconnected and pt. States he is 40 and cannot assist. She states she has a friend Mimi who can come stay with her, but she does not know Mimi's number. I will follow for potential admit, pending identification of a safe disposition and medical readiness.  Clemens Catholic, [place], [facility] Admissions Coordinator  [phone] ([place]) [phone] (office)

## 2021-11-25 NOTE — Progress Notes (Signed)
SLP Cancellation Note  Patient Details Name: Jean Bowen MRN: [id number] DOB: 10-17-1945   Cancelled treatment:       Reason Eval/Treat Not Completed: Medical issues which prohibited therapy  Unable to complete BSE at this time, due to Pt NPO until cleared by surgery. Will continue efforts.  Glendine Swetz B. Quentin Ore, [facility], [place] Speech Language Pathologist Office: [phone]  Shonna Chock 11/25/2021, 10:18 AM

## 2021-11-25 NOTE — H&P (Signed)
Jean Bowen is an 76 y.o. female.   Chief Complaint: skull fracture right occiput, left frontal acute subdural hematoma, left temporal contusion HPI: ms Osley was transported to hospital by ems. Not known who called ems, no history given as to why patient was where she was at a late hour. The trauma was not witnessed. Had dried blood on head, hair matted, blood on face. By report patient was confused and not oriented x4 on multiple examinations. One episode of emesis while in the ED. No evidence intracranial hypertension. Basal cisterns widely patent, ventricles are not effaced.  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   HYPERCHOLESTEROLEMIA 02/23/2010   Hyperplastic colon polyp 12/03/2006   HYPERTENSION 12/03/2006   OSA (obstructive sleep apnea)    OSTEOPOROSIS 12/03/2006   Sleep apnea    URINARY INCONTINENCE 12/03/2006    Past Surgical History:  Procedure Laterality Date   BUNIONECTOMY     both feet   CATARACT EXTRACTION     Bil   CHOLECYSTECTOMY     ELECTROCARDIOGRAM  02/07/2006   ESOPHAGOGASTRODUODENOSCOPY  11/17/2006   Hx of nasal septal reconstruction for deviated septum  1980's   KNEE ARTHROSCOPY     Bilateral-torn meniscus   SKIN CANCER EXCISION     non melanoma cancer removed from right leg   Stress Cardiolite  02/14/2006   TUMOR REMOVAL  2003   removal 5 lb benign tumor from abdomen    Family History  Problem Relation Age of Onset   Melanoma Mother    Lung cancer Mother    Colon cancer Father        at advanced age   Arthritis Father    Lung cancer Brother    Heart attack Other        grandfather   Diabetes Other        grandfather/grandmother   Social History:  reports that she quit smoking about 20  years ago. Her smoking use included cigarettes. She has a 60.00 pack-year smoking history. She has never used smokeless tobacco. She reports current alcohol use of about 3.0 standard drinks of alcohol per week. She reports that she does not use drugs.  Allergies:  Allergies  Allergen Reactions   Doxycycline     REACTION: swollen lips and throat   Losartan Potassium     REACTION: ? of angioedema   Morphine And Related     Caused vomiting when given too fast in IV.    (Not in a hospital admission)   Results for orders placed or performed during the hospital encounter of 11/24/21 (from the past 48 hour(s))  CBC with Differential     Status: Abnormal   Collection Time: 11/24/21 11:33 PM  Result Value Ref Range   WBC 9.6 4.0 - 10.5 K/uL   RBC 4.76 3.87 - 5.11 MIL/uL   Hemoglobin 14.6 12.0 - 15.0 g/dL   HCT 44.5 36.0 - 46.0 %   MCV 93.5 80.0 - 100.0 fL   MCH 30.7 26.0 - 34.0 pg   MCHC 32.8 30.0 - 36.0 g/dL   RDW 13.2 11.5 - 15.5 %   Platelets 297 150 - 400 K/uL   nRBC 0.0 0.0 - 0.2 %  Neutrophils Relative % 44 %   Neutro Abs 4.2 1.7 - 7.7 K/uL   Lymphocytes Relative 38 %   Lymphs Abs 3.7 0.7 - 4.0 K/uL   Monocytes Relative 11 %   Monocytes Absolute 1.0 0.1 - 1.0 K/uL   Eosinophils Relative 4 %   Eosinophils Absolute 0.4 0.0 - 0.5 K/uL   Basophils Relative 1 %   Basophils Absolute 0.1 0.0 - 0.1 K/uL   Immature Granulocytes 2 %   Abs Immature Granulocytes 0.17 (H) 0.00 - 0.07 K/uL    Comment: Performed at [facility] [address], [place], [place] [address]  Ethanol     Status: None   Collection Time: 11/24/21 11:33 PM  Result Value Ref Range   Alcohol, Ethyl (B) <10 <10 mg/dL    Comment: (NOTE) Lowest detectable limit for serum alcohol is 10 mg/dL.  For medical purposes only. Performed at [facility], [place] [address], [place], [place] [address]   Protime-INR     Status: None   Collection Time: 11/24/21 11:33 PM  Result Value Ref Range   Prothrombin  Time 12.5 11.4 - 15.2 seconds   INR 0.9 0.8 - 1.2    Comment: (NOTE) INR goal varies based on device and disease states. Performed at [facility], [place] [address], [place], [place] [address]   Comprehensive metabolic panel     Status: Abnormal   Collection Time: 11/25/21 12:54 AM  Result Value Ref Range   Sodium 138 135 - 145 mmol/L   Potassium 5.1 3.5 - 5.1 mmol/L    Comment: HEMOLYSIS AT THIS LEVEL MAY AFFECT RESULT   Chloride 105 98 - 111 mmol/L   CO2 21 (L) 22 - 32 mmol/L   Glucose, Bld 157 (H) 70 - 99 mg/dL    Comment: Glucose reference range applies only to samples taken after fasting for at least 8 hours.   BUN 16 8 - 23 mg/dL   Creatinine, Ser 0.66 0.44 - 1.00 mg/dL   Calcium 8.4 (L) 8.9 - 10.3 mg/dL   Total Protein 6.5 6.5 - 8.1 g/dL   Albumin 3.9 3.5 - 5.0 g/dL   AST 39 15 - 41 U/L   ALT 25 0 - 44 U/L   Alkaline Phosphatase 69 38 - 126 U/L    Comment: HEMOLYSIS AT THIS LEVEL MAY AFFECT RESULT   Total Bilirubin 0.8 0.3 - 1.2 mg/dL   GFR, Estimated >60 >60 mL/min    Comment: (NOTE) Calculated using the CKD-EPI Creatinine Equation (2021)    Anion gap 12 5 - 15    Comment: Performed at [facility], [place] [address], [place], [place] [address]   CT Chest W Contrast  Result Date: 11/25/2021 CLINICAL DATA:  Chest trauma, cardiac injury suspected Chest trauma, blunt Fall from standing. EXAM: CT CHEST WITH CONTRAST TECHNIQUE: Multidetector CT imaging of the chest was performed during intravenous contrast administration. RADIATION DOSE REDUCTION: This exam was performed according to the departmental dose-optimization program which includes automated exposure control, adjustment of the mA and/or kV according to patient size and/or use of iterative reconstruction technique. CONTRAST:  4m OMNIPAQUE IOHEXOL 300 MG/ML  SOLN COMPARISON:  Radiograph yesterday. FINDINGS: Cardiovascular: There is no evidence of acute aortic or vascular injury. Aneurysmal dilatation of the  ascending aorta, maximal dimension 4.1 cm. There is aortic tortuosity which accounts for the radiographic appearance. The heart is normal in size. There are coronary artery calcifications. No pericardial effusion. Coronary artery  calcifications are seen. No central pulmonary embolus. Mediastinum/Nodes: No mediastinal hemorrhage or hematoma. No pneumomediastinum. No esophageal wall thickening. No adenopathy. Lungs/Pleura: No pneumothorax. No evidence of pulmonary contusion. Mild hypoventilatory changes in the dependent lungs. Mild central bronchial wall thickening. No pulmonary mass. No pleural effusion. Unremarkable appearance of the trachea and central bronchi. Upper Abdomen: No acute findings or free fluid. Musculoskeletal: No acute fracture of the ribs, sternum, included clavicles or shoulder girdles. Prominent right glenohumeral osteoarthritis. No fracture of the thoracic spine. No confluent chest wall contusion. IMPRESSION: 1. No evidence of acute traumatic injury to the chest. 2. Aneurysmal dilatation of the ascending aorta, maximal dimension 4.1 cm. There is aortic tortuosity which accounts for the radiographic appearance. Recommend annual imaging followup by CTA or MRA. This recommendation follows 2010 ACCF/AHA/AATS/ACR/ASA/SCA/SCAI/SIR/STS/SVM Guidelines for the Diagnosis and Management of Patients with Thoracic Aortic Disease. Circulation. 2010; 121: K938-H829. Aortic aneurysm NOS (ICD10-I71.9) 3. Coronary artery calcifications. 4. Mild central bronchial wall thickening. Aortic Atherosclerosis (ICD10-I70.0). Electronically Signed   By: Keith Rake M.D.   On: 11/25/2021 01:27   DG Chest Portable 1 View  Result Date: 11/25/2021 CLINICAL DATA:  Fall EXAM: PORTABLE CHEST 1 VIEW COMPARISON:  02/25/2020 FINDINGS: Heart is normal size. Prominent mediastinal contours. Bibasilar atelectasis. No effusions or pneumothorax. No acute bony abnormality. IMPRESSION: Mediastinal widening. This may be related to  technique, but cannot exclude mediastinal hematoma. Consider further evaluation with chest CT with contrast to exclude aortic injury. Electronically Signed   By: Rolm Baptise M.D.   On: 11/25/2021 00:11    Review of Systems  Blood pressure 135/83, pulse 96, temperature (!) 97.5 F (36.4 C), temperature source Oral, resp. rate 16, height '5\' 7"'$  (1.702 m), weight 80 kg, SpO2 96 %. Physical Exam Constitutional:      Comments: Dried blood on shoulders, face, inside nares.  HENT:     Head: Normocephalic. Abrasion and laceration present.     Jaw: There is normal jaw occlusion.      Comments: Abraded scalp and small laceration. Hair matted with dried blood    Right Ear: External ear normal.     Left Ear: External ear normal.     Mouth/Throat:     Mouth: Mucous membranes are moist.     Pharynx: Oropharynx is clear.  Eyes:     Extraocular Movements: Extraocular movements intact.     Conjunctiva/sclera: Conjunctivae normal.     Pupils: Pupils are equal, round, and reactive to light.  Neck:     Comments: Plastic ems cervical collar on neck Cardiovascular:     Rate and Rhythm: Normal rate and regular rhythm.     Pulses: Normal pulses.  Pulmonary:     Comments: Multiple episodes of decreased oxygen saturation since admission Abdominal:     General: Abdomen is flat.  Musculoskeletal:        General: Normal range of motion.  Skin:    General: Skin is warm and dry.  Neurological:     Mental Status: She is alert and oriented to person, place, and time.     GCS: GCS eye subscore is 4. GCS verbal subscore is 5. GCS motor subscore is 6.     Cranial Nerves: Cranial nerves 2-12 are intact.     Sensory: Sensation is intact.     Motor: Motor function is intact. No weakness or pronator drift.     Coordination: Coordination is intact.     Deep Tendon Reflexes: Babinski sign absent on the right side. Babinski  sign absent on the left side.     Comments: Oriented to person, place, situation,  year Following all commands Amnestic for event "I collapsed"       Assessment/Plan Jean Bowen is a 76 y.o. female With an occipital skull fracture, traumatic sah,left temporal contusion, left frontal contusion, right cerebellar contusion. There is no coherent explanation as to why this individual was not at her home but found on the street.  I removed the ems cervical collar, no acute changes identified on ct scan.  I closed the laceration on scalp after nursing assistance to clean patient's scalp and hair.  No known reason for hypoxic episodes, no indication this stems from head injury. Will admit, and consult critical care for respiratory evaluation. Remains on nasal cannula at this time.  At this time neurological examination is excellent.  Ashok Pall, MD 11/25/2021, 3:39 AM

## 2021-11-25 NOTE — ED Triage Notes (Addendum)
Pt came in via [facility]. Came with c/o fall from standing. Unknown if on thinners. Blood on face. EMS states there was bleeding from mouth and nose. BP elevated at 180s/120s. C-collar on. Pt GCS 13-14. Unknown LOC.

## 2021-11-25 NOTE — Progress Notes (Signed)
? ?  Inpatient Rehab Admissions Coordinator : ? ?Per therapy recommendations, patient was screened for CIR candidacy by Randee Huston RN MSN.  At this time patient appears to be a potential candidate for CIR. I will place a rehab consult per protocol for full assessment. Please call me with any questions. ? ?Kayson Bullis RN MSN ?Admissions Coordinator ?[phone] ?  ?

## 2021-11-25 NOTE — ED Provider Notes (Signed)
[facility] EMERGENCY DEPARTMENT Provider Note  CSN: [id number] Arrival date & time: 11/24/21 2328  Chief Complaint(s) Fall  ED Triage Notes Elba Barman, RN (Registered Nurse)   Emergency Medicine   Date of Service: 11/24/2021 11:27 PM   Addendum   Pt came in via [facility]. Came with c/o fall from standing. Unknown if on thinners. Blood on face. EMS states there was bleeding from mouth and nose. BP elevated at 180s/120s. C-collar on. Pt GCS 13-14. Unknown LOC.      HPI Jean Bowen is a 76 y.o. female who presents as a level 2 trauma after fall from standing with head injury.  Patient was bleeding from the nose.  Noted to be hypoxic with sats in the 80s improved on nonrebreather blow-by.  Patient was noted to be hypertensive.  She is altered.  The history is provided by the EMS personnel.    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 Authorizing Provider  acetaminophen (TYLENOL) 325 MG tablet Take 650 mg by mouth every 6 (six) hours as needed.    [provider]  acyclovir (ZOVIRAX) 400 MG tablet Take 2 tablets (800 mg total) by mouth daily. TAKE TWO TABLETS BY MOUTH EVERY 4 HOURS 11/20/21   Vivi Barrack, MD  amLODipine (NORVASC) 2.5 MG tablet TAKE ONE TABLET BY MOUTH DAILY 10/19/21   Vivi Barrack, MD  escitalopram (LEXAPRO) 10 MG tablet Take 1 tablet (10 mg total) by mouth daily. 11/20/21   Vivi Barrack, MD  hydrOXYzine (ATARAX/VISTARIL) 10 MG tablet Take 1 tablet (10 mg total) by mouth 3 (three) times daily as needed for itching. 08/21/20   Vivi Barrack, MD  levothyroxine (SYNTHROID) 25 MCG tablet Take 1 tablet (25 mcg total) by mouth daily with breakfast. 11/20/21   Vivi Barrack, MD  omeprazole (PRILOSEC) 40 MG capsule Take 1 capsule (40 mg total) by mouth daily. 10/16/14   Lafayette Dragon, MD  oxybutynin (DITROPAN) 5 MG tablet Take 1 tablet (5 mg total) by mouth 3 (three) times daily. 11/20/21   Vivi Barrack, MD  Allergies Doxycycline, Losartan potassium, and Morphine and related  Review of Systems Review of Systems As noted in HPI  Physical Exam Vital Signs  I have reviewed the triage vital signs BP (!) 147/94   Pulse 95   Temp (!) 97.5 F (36.4 C) (Oral)   Resp 16   Ht '5\' 7"'$  (1.702 m)   Wt 80 kg   SpO2 92%   BMI 27.62 kg/m   Physical Exam Constitutional:      General: She is not in acute distress.    Appearance: She is well-developed. She is not diaphoretic.  HENT:     Head: Normocephalic. No raccoon eyes or Battle's sign.     Jaw: There is normal jaw occlusion. No tenderness or malocclusion.     Comments: No midface instability    Right Ear: External ear normal.      Left Ear: External ear normal.     Nose: Nose normal.     Comments: Dried bilateral blood in nares    Mouth/Throat:     Dentition: No dental tenderness.  Eyes:     General: No scleral icterus.       Right eye: No discharge.        Left eye: No discharge.     Conjunctiva/sclera: Conjunctivae normal.     Pupils: Pupils are equal, round, and reactive to light.  Cardiovascular:     Rate and Rhythm: Normal rate and regular rhythm.     Pulses:          Radial pulses are 2+ on the right side and 2+ on the left side.       Dorsalis pedis pulses are 2+ on the right side and 2+ on the left side.     Heart sounds: Normal heart sounds. No murmur heard.    No friction rub. No gallop.  Pulmonary:     Effort: Pulmonary effort is normal. No respiratory distress.     Breath sounds: Normal breath sounds. No stridor. No wheezing.  Abdominal:     General: There is no distension.     Palpations: Abdomen is soft.     Tenderness: There is no abdominal tenderness.  Musculoskeletal:        General: No tenderness.     Cervical back: Normal range of motion and neck supple. No bony tenderness.     Thoracic back: No bony tenderness.     Lumbar back: No bony tenderness.     Comments: Clavicles stable. Chest stable to AP/Lat compression. Pelvis stable to Lat compression. No obvious extremity deformity. No chest or abdominal wall contusion.  Skin:    General: Skin is warm and dry.     Findings: No erythema or rash.  Neurological:     Mental Status: She is alert. She is disoriented.     GCS: GCS eye subscore is 3. GCS verbal subscore is 4. GCS motor subscore is 6.     Comments: Oriented to person only Moving all extremities with 5/5 strength     ED Results and Treatments Labs (all labs ordered are listed, but only abnormal results are displayed) Labs Reviewed  CBC WITH DIFFERENTIAL/PLATELET - Abnormal; Notable for the following components:      Result Value   Abs Immature Granulocytes 0.17 (*)     All other components within normal limits  COMPREHENSIVE METABOLIC PANEL - Abnormal; Notable for the following components:   CO2 21 (*)    Glucose, Bld 157 (*)    Calcium  8.4 (*)    All other components within normal limits  ETHANOL  PROTIME-INR                                                                                                                         EKG  EKG Interpretation  Date/Time:  Tuesday November 24 2021 23:35:28 EDT Ventricular Rate:  115 PR Interval:  163 QRS Duration: 77 QT Interval:  304 QTC Calculation: 421 R Axis:   57 Text Interpretation: Sinus tachycardia Anteroseptal infarct, old Repol abnrm suggests ischemia, diffuse leads Confirmed by Addison Lank [phone]) on 11/25/2021 12:52:43 AM       Radiology CT Chest W Contrast  Result Date: 11/25/2021 CLINICAL DATA:  Chest trauma, cardiac injury suspected Chest trauma, blunt Fall from standing. EXAM: CT CHEST WITH CONTRAST TECHNIQUE: Multidetector CT imaging of the chest was performed during intravenous contrast administration. RADIATION DOSE REDUCTION: This exam was performed according to the departmental dose-optimization program which includes automated exposure control, adjustment of the mA and/or kV according to patient size and/or use of iterative reconstruction technique. CONTRAST:  55m OMNIPAQUE IOHEXOL 300 MG/ML  SOLN COMPARISON:  Radiograph yesterday. FINDINGS: Cardiovascular: There is no evidence of acute aortic or vascular injury. Aneurysmal dilatation of the ascending aorta, maximal dimension 4.1 cm. There is aortic tortuosity which accounts for the radiographic appearance. The heart is normal in size. There are coronary artery calcifications. No pericardial effusion. Coronary artery calcifications are seen. No central pulmonary embolus. Mediastinum/Nodes: No mediastinal hemorrhage or hematoma. No pneumomediastinum. No esophageal wall thickening. No adenopathy. Lungs/Pleura: No pneumothorax. No evidence of  pulmonary contusion. Mild hypoventilatory changes in the dependent lungs. Mild central bronchial wall thickening. No pulmonary mass. No pleural effusion. Unremarkable appearance of the trachea and central bronchi. Upper Abdomen: No acute findings or free fluid. Musculoskeletal: No acute fracture of the ribs, sternum, included clavicles or shoulder girdles. Prominent right glenohumeral osteoarthritis. No fracture of the thoracic spine. No confluent chest wall contusion. IMPRESSION: 1. No evidence of acute traumatic injury to the chest. 2. Aneurysmal dilatation of the ascending aorta, maximal dimension 4.1 cm. There is aortic tortuosity which accounts for the radiographic appearance. Recommend annual imaging followup by CTA or MRA. This recommendation follows 2010 ACCF/AHA/AATS/ACR/ASA/SCA/SCAI/SIR/STS/SVM Guidelines for the Diagnosis and Management of Patients with Thoracic Aortic Disease. Circulation. 2010; 121:: U045-W098 Aortic aneurysm NOS (ICD10-I71.9) 3. Coronary artery calcifications. 4. Mild central bronchial wall thickening. Aortic Atherosclerosis (ICD10-I70.0). Electronically Signed   By: MKeith RakeM.D.   On: 11/25/2021 01:27   DG Chest Portable 1 View  Result Date: 11/25/2021 CLINICAL DATA:  Fall EXAM: PORTABLE CHEST 1 VIEW COMPARISON:  02/25/2020 FINDINGS: Heart is normal size. Prominent mediastinal contours. Bibasilar atelectasis. No effusions or pneumothorax. No acute bony abnormality. IMPRESSION: Mediastinal widening. This may be related to technique, but cannot exclude mediastinal hematoma. Consider further evaluation with chest CT with contrast to exclude aortic injury. Electronically Signed   By: KRolm BaptiseM.D.   On: 11/25/2021 00:11  Medications Ordered in ED Medications  sodium chloride 0.9 % bolus 1,000 mL (0 mLs Intravenous Stopped 11/25/21 0108)    Followed by  0.9 %  sodium chloride infusion (1,000 mLs Intravenous New Bag/Given 11/25/21 0105)  labetalol (NORMODYNE) injection  20 mg (20 mg Intravenous Given 11/25/21 0105)    And  clevidipine (CLEVIPREX) infusion 0.5 mg/mL (8 mg/hr Intravenous Rate/Dose Change 11/25/21 0236)  ondansetron (ZOFRAN) injection 4 mg (4 mg Intravenous Given 11/24/21 2336)  iohexol (OMNIPAQUE) 300 MG/ML solution 75 mL (75 mLs Intravenous Contrast Given 11/25/21 0103)                                                                                                                                     Procedures .1-3 Lead EKG Interpretation  Performed by: Fatima Blank, MD Authorized by: Fatima Blank, MD     Interpretation: normal     ECG rate:  90   ECG rate assessment: normal     Rhythm: sinus rhythm     Ectopy: none     Conduction: normal   .Critical Care  Performed by: Fatima Blank, MD Authorized by: Fatima Blank, MD   Critical care provider statement:    Critical care time (minutes):  75   Critical care time was exclusive of:  Separately billable procedures and treating other patients   Critical care was necessary to treat or prevent imminent or life-threatening deterioration of the following conditions:  CNS failure or compromise and circulatory failure   Critical care was time spent personally by me on the following activities:  Development of treatment plan with patient or surrogate, discussions with consultants, evaluation of patient's response to treatment, examination of patient, obtaining history from patient or surrogate, review of old charts, re-evaluation of patient's condition, pulse oximetry, ordering and review of radiographic studies, ordering and review of laboratory studies and ordering and performing treatments and interventions   Care discussed with: admitting provider     (including critical care time)  Medical Decision Making / ED Course   Medical Decision Making Amount and/or Complexity of Data Reviewed Independent Historian: EMS Labs: ordered. Decision-making details  documented in ED Course. Radiology: ordered and independent interpretation performed. Decision-making details documented in ED Course. ECG/medicine tests: ordered and independent interpretation performed. Decision-making details documented in ED Course.  Risk Prescription drug management. Parenteral controlled substances. Decision regarding hospitalization.     Clinical Course as of 11/25/21 0323  Tue Nov 24, 2021  2320 Level 2 trauma, fall from standing with head injury. Altered. ABCs intact. Secondary as above. Noted to be hypertensive.  Will need to assess for any intracranial injuries including ICH.  Looking for cervical injuries as well. No other injuries noted on exam. Will get chest x-ray given hypoxia [PC]  Wed Nov 25, 2021  0040 Independent interpretation: CT head notable for evidence of bilateral frontal traumatic subarachnoid hemorrhage, L>R.  No other  obvious noted on my evaluation. CT cervical spine without obvious fractures but does have irregularities from C5-C7.  Will await radiology reads. Chest x-ray notable for widened mediastinum likely due to patient's exhalation but will need to assess with a CT scan to rule out mediastinal injury. [PC]  0041 Patient was given labetalol bolus and started on Cleviprex for blood pressure control.  [PC]  0102 CT of the chest without obvious injuries on my independent interpretation.  Awaiting radiology formal read  [PC]  0140 Consult to [facility] placed  [PC]  0230 Laboratory Tests ordered listed below with my independent interpretation: -CBC without leukocytosis or anemia -Metabolic panel without significant electrolyte derangements or renal sufficiency -EtOH negative  [PC]  3903 Spoke with Dr. Christella Noa, from [facility] who will evaluate patient for admission and further management [PC]    Clinical Course User Index [PC] Westlynn Fifer, Grayce Sessions, MD      Final Clinical Impression(s) / ED Diagnoses Final diagnoses:  Fall, initial  encounter  Traumatic subarachnoid hemorrhage with loss of consciousness of 30 minutes or less, initial encounter [facility])  Other closed fracture of right side of occipital bone, initial encounter ([place])  Subdural hematoma ([place])           This chart was dictated using voice recognition software.  Despite best efforts to proofread,  errors can occur which can change the documentation meaning.    Fatima Blank, MD 11/25/21 [phone]

## 2021-11-25 NOTE — Telephone Encounter (Signed)
Patient scheduled 01/08/2022 at 11am with Dr. [place] Bells for [place]- reminder mailed to address on file, nothing further needed.

## 2021-11-25 NOTE — ED Notes (Signed)
Patient transported to CT 

## 2021-11-25 NOTE — Evaluation (Signed)
Physical Therapy Evaluation Patient Details Name: Jean Bowen MRN: [id number] DOB: 1946/01/26 Today's Date: 11/25/2021  History of Present Illness  Pt is a 76 y.o. female who presented 11/24/21 s/p fall with head injury, unclear as to the situation. Pt sustained an occipital skull fracture, traumatic SAHs involving the bil frontal and temporal lobes as well as the R cerebellum traumatic, left temporal contusion, left frontal contusion, right cerebellar contusion. PMH: angioedema, HTN, OSA, osteoporosis, hypercholesterolemia, GERD, diverticulosis, depression   Clinical Impression  Pt presents with condition above and deficits mentioned below, see PT Problem List. PTA, she reports she was IND without DME, living alone in a 2-level house. Pt declines any recall of the events that lead to her hospitalization. Pt is limited by lethargy, falling asleep whenever she is supine. In addition, pt is mildly impulsive and perseverating on wanting water. Pt with noted deficits in memory and awareness, but follows simple commands appropriately. She required min-modA for bed mobility and modA for transfers to stand today with UE support. She was unable to stand > ~10 sec each time, and thus did not ambulate today. She demonstrates deficits in strength (fairly symmetrical though), balance, activity tolerance, and cognition and is at high risk for falls. Pt would benefit from intensive therapy in the AIR setting. Will continue to follow acutely.       Recommendations for follow up therapy are one component of a multi-disciplinary discharge planning process, led by the attending physician.  Recommendations may be updated based on patient status, additional functional criteria and insurance authorization.  Follow Up Recommendations Acute inpatient rehab (3hours/day)      Assistance Recommended at Discharge Frequent or constant Supervision/Assistance  Patient can return home with the following  A lot of help with  walking and/or transfers;A lot of help with bathing/dressing/bathroom;Assistance with cooking/housework;Direct supervision/assist for medications management;Direct supervision/assist for financial management;Assist for transportation;Help with stairs or ramp for entrance    Equipment Recommendations Rolling walker (2 wheels);BSC/3in1 (pending progress)  Recommendations for Other Services  Rehab consult;OT consult    Functional Status Assessment Patient has had a recent decline in their functional status and demonstrates the ability to make significant improvements in function in a reasonable and predictable amount of time.     Precautions / Restrictions Precautions Precautions: Fall;Other (comment) Precaution Comments: SBP goal 130-150 Restrictions Weight Bearing Restrictions: No      Mobility  Bed Mobility Overal bed mobility: Needs Assistance Bed Mobility: Supine to Sit, Sit to Supine     Supine to sit: Min assist, HOB elevated Sit to supine: Mod assist, HOB elevated   General bed mobility comments: MinA for pt to pull on PT's hand to ascend trunk to sit EOB, HOB elevated. ModA to manage pt's legs and pivot hips to return to supine, pt lower trunk onto bed impulsively at times.    Transfers Overall transfer level: Needs assistance Equipment used: 1 person hand held assist Transfers: Sit to/from Stand Sit to Stand: Mod assist           General transfer comment: ModA to steady and complete power up to stand 2x from EOB with UE support on therapist. Pt with posterior lean and LOB requiring modA to recover.    Ambulation/Gait               General Gait Details: deferred due to pt only tolerating standing for ~10 sec at a time.  Stairs            Emergency planning/management officer  Modified Rankin (Stroke Patients Only)       Balance Overall balance assessment: Needs assistance Sitting-balance support: Bilateral upper extremity supported, Single extremity  supported, Feet supported Sitting balance-Leahy Scale: Poor Sitting balance - Comments: Reliant on UE support, min guard for static sitting safety Postural control: Posterior lean Standing balance support: Bilateral upper extremity supported, Single extremity supported Standing balance-Leahy Scale: Poor Standing balance comment: Reliant on UE support and modA to prevent posterior LOB, standing 2x ~10 sec each                             Pertinent Vitals/Pain Pain Assessment Pain Assessment: Faces Faces Pain Scale: Hurts even more Pain Location: headache Pain Descriptors / Indicators: Headache Pain Intervention(s): Limited activity within patient's tolerance, Monitored during session, Repositioned    Home Living Family/patient expects to be discharged to:: Private residence Living Arrangements: Alone Available Help at Discharge: Neighbor Type of Home: House Home Access: Stairs to enter Entrance Stairs-Rails:  (unsure) Entrance Stairs-Number of Steps: 5   Home Layout: Two level;Bed/bath upstairs;1/2 bath on main level Home Equipment:  (pt lethargic and drifted off during conversation) Additional Comments: has a dog    Prior Function Prior Level of Function : Independent/Modified Independent;Driving             Mobility Comments: Denies any use of AD at baseline       Hand Dominance        Extremity/Trunk Assessment   Upper Extremity Assessment Upper Extremity Assessment: Defer to OT evaluation    Lower Extremity Assessment Lower Extremity Assessment: Generalized weakness (MMT scores of 4 hip flexion bil, 5 knee extension bil, 5 ankle dorsiflexion 5; denies numbness/tingling bil)    Cervical / Trunk Assessment Cervical / Trunk Assessment: Normal  Communication   Communication: No difficulties  Cognition Arousal/Alertness: Lethargic Behavior During Therapy: Flat affect, Impulsive Overall Cognitive Status: Impaired/Different from baseline Area of  Impairment: Rancho level               Rancho Levels of Cognitive Functioning [place]igos Scales of Cognitive Functioning: Confused, Appropriate (at least)               General Comments: Pt lethargic, often falling asleep quickly once supine, needing stimulation to remain awake. Pt mildly impulsive to try to lay back down even though repeatedly educated need to stay up a little longer to change bed linens. Pt perseverating on desire to have water, even though repeatedly educated orders to remain NPO. Follows simple commands consistently.   [place]ancho [employer] Scales of Cognitive Functioning: Confused, Appropriate (at least)    General Comments General comments (skin integrity, edema, etc.): HR up to 145 bpm, SpO2 stable on 3.5L O2, BP stable    Exercises     Assessment/Plan    PT Assessment Patient needs continued PT services  PT Problem List Decreased strength;Decreased activity tolerance;Decreased balance;Decreased coordination;Decreased mobility;Decreased cognition;Decreased knowledge of use of DME;Decreased safety awareness;Cardiopulmonary status limiting activity       PT Treatment Interventions DME instruction;Gait training;Stair training;Functional mobility training;Therapeutic activities;Therapeutic exercise;Balance training;Neuromuscular re-education;Cognitive remediation;Patient/family education    PT Goals (Current goals can be found in the Care Plan section)  Acute Rehab PT Goals Patient Stated Goal: to lay back down PT Goal Formulation: With patient Time For Goal Achievement: 12/09/21 Potential to Achieve Goals: Good    Frequency Min 4X/week     Co-evaluation  AM-PAC PT "6 Clicks" Mobility  Outcome Measure Help needed turning from your back to your side while in a flat bed without using bedrails?: A Little Help needed moving from lying on your back to sitting on the side of a flat bed without using bedrails?: A Little Help  needed moving to and from a bed to a chair (including a wheelchair)?: A Lot Help needed standing up from a chair using your arms (e.g., wheelchair or bedside chair)?: A Lot Help needed to walk in hospital room?: Total Help needed climbing 3-5 steps with a railing? : Total 6 Click Score: 12    End of Session Equipment Utilized During Treatment: Gait belt Activity Tolerance: Patient limited by pain;Patient limited by lethargy Patient left: in bed;with call bell/phone within reach;Other (comment) (bed alarm not working) Nurse Communication: Mobility status;Other (comment) (HR, bed alarm not working) PT Visit Diagnosis: Unsteadiness on feet (R26.81);Other abnormalities of gait and mobility (R26.89);Muscle weakness (generalized) (M62.81);Difficulty in walking, not elsewhere classified (R26.2)    Time: 2633-3545 PT Time Calculation (min) (ACUTE ONLY): 22 min   Charges:   PT Evaluation $PT Eval Moderate Complexity: 1 Mod          Moishe Spice, PT, DPT Acute Rehabilitation Services  Office: [phone]   Orvan Falconer 11/25/2021, 8:39 AM

## 2021-11-25 NOTE — Evaluation (Signed)
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 PMH: angioedema, depression, dermatitis, diverticulosis, GERD, hiatal hernia, benign castric tumor, hypercholesterolemia, HTN, OSA, osteoporosis    Assessment / Plan / Recommendation  Clinical Impression  Pt seen at bedside for assessment of swallow function and to identify least restrictive diet. Pt presents with adequate natural dentition, CN exam WFL. Pt reports no history of swallowing difficulty prior to admit. Pt accepted trials of thin liquid, puree, and solid texture. She  required encouragement to take additional boluses. No obvious oral issues or overt s/s aspiration observed on PO trials. Recommend regular diet with thin liquids. No follow up recommended for swallowing at this time. Please reconsult if needs arise.  SLP Visit Diagnosis: Dysphagia, unspecified (R13.10)    Aspiration Risk  Mild aspiration risk    Diet Recommendation Regular;Thin liquid   Liquid Administration via: Straw;Cup Medication Administration: Whole meds with liquid Supervision: Patient able to self feed Compensations: Slow rate;Small sips/bites Postural Changes: Seated upright at 90 degrees;Remain upright for at least 30 minutes after po intake    Other  Recommendations Oral Care Recommendations: Oral care BID    Recommendations for follow up therapy are one component of a multi-disciplinary discharge planning process, led by the attending physician.  Recommendations may be updated based on patient status, additional functional criteria and insurance authorization.  Follow up Recommendations Follow physician's recommendations for discharge plan and follow up therapies      Assistance Recommended at Discharge Other (comment) (TBD)  Functional Status Assessment Patient has not had a recent decline in their functional status (re: swallowing)      Prognosis Prognosis for Safe Diet Advancement: Good      Swallow Study   General Date of Onset: 11/25/21 HPI: 76yo female admitted 11/25/21 after a fall with right occipital fracture, Left frontal ADH, left temporal contusion. PMH: angioedema, depression, dermatitis, diverticulosis, GERD, hiatal hernia, benign castric tumor, hypercholesterolemia, HTN, OSA, osteoporosis Type of Study: Bedside Swallow Evaluation Previous Swallow Assessment: none Diet Prior to this Study:  NPO Temperature Spikes Noted: No Respiratory Status: Nasal cannula History of Recent Intubation: No Behavior/Cognition: Alert;Cooperative;Pleasant mood Oral Cavity  Assessment: Within Functional Limits Oral Care Completed by SLP: Yes Oral Cavity - Dentition: Adequate natural dentition Vision: Functional for self-feeding Self-Feeding Abilities: Able to feed self Patient Positioning: Upright in bed Baseline Vocal Quality: Normal Volitional Cough: Strong Volitional Swallow: Able to elicit    Oral/Motor/Sensory Function Overall Oral Motor/Sensory Function: Within functional limits   Ice Chips Ice chips: Within functional limits Presentation: Spoon   Thin Liquid Thin Liquid: Within functional limits Presentation: Straw;Cup;Self Fed    Nectar Thick Nectar Thick Liquid: Not tested   Honey Thick Honey Thick Liquid: Not tested   Puree Puree: Within functional limits Presentation: Spoon;Self Fed   Solid     Solid: Within functional limits Presentation: [place] B. Quentin Ore, [facility], [place] Speech Language Pathologist Office: [phone]  Shonna Chock 11/25/2021,2:12 PM

## 2021-11-25 NOTE — Evaluation (Signed)
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   Hypoxia 11/25/2021   Nodule of upper lobe of left lung 11/25/2021   Aortic aneurysm, thoracic ([place]) 11/25/2021   Anemia 06/25/2019   Hyperglycemia 03/19/2019   Hyperparathyroidism ([place]) 02/12/2015   Thrombocytopenia ([place]) 02/11/2015   Dysphagia 03/02/2011   HYPERCHOLESTEROLEMIA 02/23/2010   Pruritus 01/08/2010   ANGIOEDEMA 12/11/2009   GERD 03/03/2007   Hypothyroidism 12/03/2006   Major depressive disorder, single episode, in remission ([place]) 12/03/2006   Essential hypertension 12/03/2006   Osteoporosis 12/03/2006   Overactive bladder 12/03/2006   COLONIC POLYPS, HX OF 12/03/2006   Past Medical History:  Past Medical History:  Diagnosis Date   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   HYPERCHOLESTEROLEMIA 02/23/2010   Hyperplastic colon polyp 12/03/2006   HYPERTENSION 12/03/2006   OSA (obstructive sleep apnea)    OSTEOPOROSIS 12/03/2006   Sleep apnea    URINARY INCONTINENCE 12/03/2006   Past Surgical History:  Past Surgical History:  Procedure Laterality Date   BUNIONECTOMY     both feet   CATARACT EXTRACTION     Bil   CHOLECYSTECTOMY     ELECTROCARDIOGRAM  02/07/2006   ESOPHAGOGASTRODUODENOSCOPY  11/17/2006   Hx of nasal septal reconstruction for deviated septum  1980's   KNEE ARTHROSCOPY     Bilateral-torn meniscus   SKIN CANCER EXCISION     non melanoma cancer removed from right leg   Stress Cardiolite  02/14/2006   TUMOR REMOVAL   2003   removal 5 lb benign tumor from abdomen   HPI:  76yo female admitted 11/25/21 after a fall with right occipital fracture, Left frontal ADH, left temporal contusion. PMH: angioedema, depression, dermatitis, diverticulosis, GERD, hiatal hernia, benign castric tumor, hypercholesterolemia, HTN, OSA, osteoporosis   Assessment / Plan / Recommendation Clinical Impression  The mini-mental State Examination (MMSE) was administered today to assess cognitive linguistic function. Pt scored 21/30 (n=27+/30) raising concern for mild cognitive impairment. Pt had difficulty with questions regarding orientation to time nd place, and delayed recall. Anticipate continued improvement with thinking skills as mentation improves. Recommend further (higher level) testing prior to discharge, given pt's level of independence prior to admit.    SLP Assessment  SLP Recommendation/Assessment: Patient needs continued Speech Language Pathology Services    Recommendations for follow up therapy are one component of a multi-disciplinary discharge planning process, led by the attending physician.  Recommendations may be updated based on patient status, additional functional criteria and insurance authorization.    Follow Up Recommendations  Follow physician's recommendations for discharge plan and follow up therapies    Assistance Recommended at Discharge  Other (comment) (TBD)  Functional Status Assessment Patient has had a recent decline in their functional status and demonstrates the ability to make significant improvements in function in a reasonable and predictable amount of time.  Frequency and Duration min 1 x/week  2 weeks      SLP Evaluation Cognition  Overall Cognitive Status: Impaired/Different from baseline Orientation Level: Oriented to person;Oriented to place;Disoriented to time [facility]  Amigos Scales of Cognitive Functioning: Confused, Appropriate (at least)       Comprehension  Auditory  Comprehension Overall Auditory Comprehension: Appears within functional limits for tasks assessed Reading Comprehension Reading Status: Within funtional limits    Expression Expression Primary Mode of Expression: Verbal Verbal Expression Overall Verbal Expression: Appears within functional limits for tasks assessed Written Expression Dominant Hand: Left Written Expression: Within Functional Limits   Oral / Motor  Oral Motor/Sensory Function Overall Oral Motor/Sensory Function: Within functional limits Motor Speech Overall Motor Speech: Appears within functional limits for tasks assessed           Benjamin Merrihew B. Quentin Ore, [facility], [place] Speech Language Pathologist Office: [phone]  Shonna Chock 11/25/2021, 10:44 AM

## 2021-11-26 ENCOUNTER — Inpatient Hospital Stay (HOSPITAL_COMMUNITY): Payer: Medicare Other

## 2021-11-26 ENCOUNTER — Telehealth: Payer: Self-pay | Admitting: Pulmonary Disease

## 2021-11-26 DIAGNOSIS — K219 Gastro-esophageal reflux disease without esophagitis: Secondary | ICD-10-CM | POA: Diagnosis not present

## 2021-11-26 DIAGNOSIS — S06344A Traumatic hemorrhage of right cerebrum with loss of consciousness of 6 hours to 24 hours, initial encounter: Secondary | ICD-10-CM | POA: Diagnosis not present

## 2021-11-26 LAB — BASIC METABOLIC PANEL
Anion gap: 10 (ref 5–15)
BUN: 9 mg/dL (ref 8–23)
CO2: 23 mmol/L (ref 22–32)
Calcium: 8.3 mg/dL — ABNORMAL LOW (ref 8.9–10.3)
Chloride: 105 mmol/L (ref 98–111)
Creatinine, Ser: 0.54 mg/dL (ref 0.44–1.00)
GFR, Estimated: >60 mL/min (ref >60)
Glucose, Bld: 155 mg/dL — ABNORMAL HIGH (ref 70–99)
Potassium: 3.7 mmol/L (ref 3.5–5.1)
Sodium: 138 mmol/L (ref 135–145)

## 2021-11-26 LAB — GLUCOSE, CAPILLARY
Glucose-Capillary: 144 mg/dL — ABNORMAL HIGH (ref 70–99)
Glucose-Capillary: 151 mg/dL — ABNORMAL HIGH (ref 70–99)
Glucose-Capillary: 147 mg/dL — ABNORMAL HIGH (ref 70–99)
Glucose-Capillary: 155 mg/dL — ABNORMAL HIGH (ref 70–99)
Glucose-Capillary: 138 mg/dL — ABNORMAL HIGH (ref 70–99)

## 2021-11-26 LAB — TRIGLYCERIDES: Triglycerides: 173 mg/dL — ABNORMAL HIGH (ref <150)

## 2021-11-26 MED ORDER — LABETALOL HCL 100 MG PO TABS
100.0000 mg | ORAL_TABLET | Freq: Two times a day (BID) | ORAL | Status: DC
  Administered 2021-11-26 – 2021-12-08 (×25): 100 mg via ORAL
  Filled 2021-11-26 (×25): qty 1

## 2021-11-26 MED ORDER — AMLODIPINE BESYLATE 10 MG PO TABS
10.0000 mg | ORAL_TABLET | Freq: Every day | ORAL | Status: DC
  Administered 2021-11-26 – 2021-12-08 (×13): 10 mg via ORAL
  Filled 2021-11-26 (×13): qty 1

## 2021-11-26 NOTE — Progress Notes (Signed)
Physical Therapy Treatment Patient Details Name: Jean Bowen MRN: [id number] DOB: Oct 18, 1945 Today's Date: 11/26/2021   History of Present Illness Pt is a 76 y.o. female who presented 11/24/21 s/p fall with head injury, unclear as to the situation. Pt sustained an occipital skull fracture, traumatic SAHs involving the bil frontal and temporal lobes as well as the R cerebellum traumatic, left temporal contusion, left frontal contusion, right cerebellar contusion. PMH: angioedema, HTN, OSA, osteoporosis, hypercholesterolemia, GERD, diverticulosis, depression    PT Comments    Pt is making gradual progress with mobility. She was able to remain awake and tolerate being upright better today, thus was able to progress to ambulating with a RW and minA within the room. However, she continues to report feeling dizzy, thereby limiting gait distance. Her BP was stable but her SpO2 varied from 87%-90s% on 3L O2. Pt requiring increased time to sit and rest between bouts of activity. She is disoriented to the date and demonstrates poor STM, not recalling the information provided to re-orient her several times. Will continue to follow acutely. Current recommendations remain appropriate.     Recommendations for follow up therapy are one component of a multi-disciplinary discharge planning process, led by the attending physician.  Recommendations may be updated based on patient status, additional functional criteria and insurance authorization.  Follow Up Recommendations  Acute inpatient rehab (3hours/day)     Assistance Recommended at Discharge Frequent or constant Supervision/Assistance  Patient can return home with the following Assistance with cooking/housework;Direct supervision/assist for medications management;Direct supervision/assist for financial management;Assist for transportation;Help with stairs or ramp for entrance;A little help with walking and/or transfers;A little help with  bathing/dressing/bathroom   Equipment Recommendations  Rolling walker (2 wheels);BSC/3in1    Recommendations for Other Services       Precautions / Restrictions Precautions Precautions: Fall;Other (comment) Precaution Comments: SBP goal 130-150 Restrictions Weight Bearing Restrictions: No     Mobility  Bed Mobility Overal bed mobility: Needs Assistance Bed Mobility: Supine to Sit     Supine to sit: HOB elevated, Min guard     General bed mobility comments: Min guard for safety coming to sit EOB, HOB elevated.    Transfers Overall transfer level: Needs assistance Equipment used: Rolling walker (2 wheels) Transfers: Sit to/from Stand Sit to Stand: Min assist, +2 safety/equipment           General transfer comment: MinA to steady with pt coming to stand 1x from bed, 1x from recliner, and 1x from commode. Pt quick to power up to stand, displaying a trunk sway.    Ambulation/Gait Ambulation/Gait assistance: Min assist, +2 safety/equipment Gait Distance (Feet): 15 Feet (x3 bouts of ~3 ft > ~15 ft > ~15 ft) Assistive device: Rolling walker (2 wheels) Gait Pattern/deviations: Step-through pattern, Decreased stride length Gait velocity: reduced Gait velocity interpretation: <1.31 ft/sec, indicative of household ambulator   General Gait Details: Pt needing cues and guidance at times to manage/turn RW or direct her to various surfaces. MinA for stability, +2 for safety and line management. Pt only tolerating short distances in the room due to reported dizziness, BP stable but SpO2 varying from 87% to 90s% on 3L   Stairs             Wheelchair Mobility    Modified Rankin (Stroke Patients Only)       Balance Overall balance assessment: Needs assistance Sitting-balance support: Bilateral upper extremity supported, Single extremity supported, Feet supported Sitting balance-Leahy Scale: Poor Sitting balance - Comments: Reliant on  UE support, min guard for static  sitting safety   Standing balance support: Bilateral upper extremity supported, Single extremity supported Standing balance-Leahy Scale: Poor Standing balance comment: Reliant on UE support                            Cognition Arousal/Alertness: Lethargic, Awake/alert Behavior During Therapy: Flat affect, Impulsive Overall Cognitive Status: Impaired/Different from baseline Area of Impairment: [place] level, Orientation, Attention, Memory, Following commands, Safety/judgement, Awareness, Problem solving               [place] Levels of Cognitive Functioning [place]igos Scales of Cognitive Functioning: Confused, Appropriate (at least) Orientation Level: Disoriented to, Time, Situation Current Attention Level: Sustained Memory: Decreased short-term memory Following Commands: Follows one step commands consistently Safety/Judgement: Decreased awareness of safety, Decreased awareness of deficits Awareness: Intellectual Problem Solving: Slow processing, Decreased initiation, Requires verbal cues, Difficulty sequencing General Comments: Needed stimulation to arouse pt initially, but once pt was up on EOB she remained awake today. Pt disoriented to time and with memory deficits, often forgetting being repeatedly told the current date. Pt perseverating on wanting someone to call a country club to let her friend know she would not be there for dinner tonight. Pt aware she fell, but difficulty recalling events that caused her fall. She recalls being with a friend outside in the parking lot when she fell.   [place] [employer] Scales of Cognitive Functioning: Confused, Appropriate (at least)    Exercises      General Comments General comments (skin integrity, edema, etc.): SpO2 down to 87% intermittently then would jump back up to 90s% on 3L, BP stable      Pertinent Vitals/Pain Pain Assessment Pain Assessment: Faces Faces Pain Scale: Hurts even more Pain Location:  headache Pain Descriptors / Indicators: Headache Pain Intervention(s): Limited activity within patient's tolerance, Monitored during session, Repositioned    Home Living Family/patient expects to be discharged to:: Private residence Living Arrangements: Alone Available Help at Discharge: Neighbor Type of Home: House Home Access: Stairs to enter Entrance Stairs-Rails: Right Entrance Stairs-Number of Steps: 5   Home Layout: Two level;Bed/bath upstairs;1/2 bath on main level Home Equipment:  (pt lethargic and drifted off during conversation) Additional Comments: has a dog    Prior Function            PT Goals (current goals can now be found in the care plan section) Acute Rehab PT Goals Patient Stated Goal: to improve PT Goal Formulation: With patient Time For Goal Achievement: 12/09/21 Potential to Achieve Goals: Good Progress towards PT goals: Progressing toward goals    Frequency    Min 4X/week      PT Plan Current plan remains appropriate    Co-evaluation PT/OT/SLP Co-Evaluation/Treatment: Yes Reason for Co-Treatment: Necessary to address cognition/behavior during functional activity;For patient/therapist safety;To address functional/ADL transfers PT goals addressed during session: Mobility/safety with mobility;Balance;Proper use of DME OT goals addressed during session: ADL's and self-care      AM-PAC PT "6 Clicks" Mobility   Outcome Measure  Help needed turning from your back to your side while in a flat bed without using bedrails?: A Little Help needed moving from lying on your back to sitting on the side of a flat bed without using bedrails?: A Little Help needed moving to and from a bed to a chair (including a wheelchair)?: A Little Help needed standing up from a chair using your arms (e.g., wheelchair or  bedside chair)?: A Little Help needed to walk in hospital room?: Total (<20 ft) Help needed climbing 3-5 steps with a railing? : Total 6 Click Score:  14    End of Session Equipment Utilized During Treatment: Gait belt;Oxygen Activity Tolerance: Patient tolerated treatment well Patient left: in chair;with call bell/phone within reach;with chair alarm set Nurse Communication: Mobility status;Other (comment) (sats) PT Visit Diagnosis: Unsteadiness on feet (R26.81);Other abnormalities of gait and mobility (R26.89);Muscle weakness (generalized) (M62.81);Difficulty in walking, not elsewhere classified (R26.2)     Time: 5625-6389 PT Time Calculation (min) (ACUTE ONLY): 44 min  Charges:  $Gait Training: 8-22 mins $Therapeutic Activity: 8-22 mins                     Moishe Spice, PT, DPT Acute Rehabilitation Services  Office: [phone]    Orvan Falconer 11/26/2021, 1:29 PM

## 2021-11-26 NOTE — Telephone Encounter (Signed)
Please arrange hospital f/u with [facility] for nocturnal hypoxemia, ?bronchiectasis, former smoker

## 2021-11-26 NOTE — Evaluation (Signed)
Occupational Therapy Evaluation Patient Details Name: Jean Bowen MRN: [id number] DOB: 31-Mar-1946 Today's Date: 11/26/2021   History of Present Illness Pt is a 76 y.o. female who presented 11/24/21 s/p fall with head injury, unclear as to the situation. Pt sustained an occipital skull fracture, traumatic SAHs involving the bil frontal and temporal lobes as well as the R cerebellum traumatic, left temporal contusion, left frontal contusion, right cerebellar contusion. PMH: angioedema, HTN, OSA, osteoporosis, hypercholesterolemia, GERD, diverticulosis, depression   Clinical Impression   PTA pt apparently lived alone independently with her dog "Shadiow". Able to ambulate to bathroom with min A +2 @ RW level and requires mod A overall with ADL tasks due to deficits listed below. AIR is appropriate for rehab however, pt will most likely need assistance for IADL tasks after DC. VSS on 2L however pt complaining of dizziness. Acute OT to follow.      Recommendations for follow up therapy are one component of a multi-disciplinary discharge planning process, led by the attending physician.  Recommendations may be updated based on patient status, additional functional criteria and insurance authorization.   Follow Up Recommendations  Acute inpatient rehab (3hours/day)    Assistance Recommended at Discharge Frequent or constant Supervision/Assistance  Patient can return home with the following A little help with walking and/or transfers;A little help with bathing/dressing/bathroom;Assistance with cooking/housework;Direct supervision/assist for medications management;Direct supervision/assist for financial management;Assist for transportation;Help with stairs or ramp for entrance    Functional Status Assessment  Patient has had a recent decline in their functional status and demonstrates the ability to make significant improvements in function in a reasonable and predictable amount of time.  Equipment  Recommendations  BSC/3in1    Recommendations for Other Services Rehab consult     Precautions / Restrictions Precautions Precautions: Fall;Other (comment) Precaution Comments: SBP goal 130-150 Restrictions Weight Bearing Restrictions: No      Mobility Bed Mobility Overal bed mobility: Needs Assistance Bed Mobility: Supine to Sit, Sit to Supine     Supine to sit: Min assist, HOB elevated          Transfers Overall transfer level: Needs assistance Equipment used: 1 person hand held assist Transfers: Sit to/from Stand Sit to Stand: Min assist, +2 safety/equipment                  Balance Overall balance assessment: Needs assistance Sitting-balance support: Bilateral upper extremity supported, Single extremity supported, Feet supported Sitting balance-Leahy Scale: Poor Sitting balance - Comments: Reliant on UE support, min guard for static sitting safety Postural control: Posterior lean Standing balance support: Bilateral upper extremity supported, Single extremity supported Standing balance-Leahy Scale: Poor Standing balance comment: Reliant on UE support and modA to prevent posterior LOB, standing 2x ~10 sec each                           ADL either performed or assessed with clinical judgement   ADL Overall ADL's : Needs assistance/impaired     Grooming: Minimal assistance   Upper Body Bathing: Set up;Supervision/ safety   Lower Body Bathing: Moderate assistance;Sit to/from stand   Upper Body Dressing : Minimal assistance   Lower Body Dressing: Moderate assistance;Sit to/from stand   Toilet Transfer: Ambulation;BSC/3in1;Minimal assistance;+2 for safety/equipment   Toileting- Clothing Manipulation and Hygiene: Moderate assistance       Functional mobility during ADLs: Minimal assistance;Cueing for safety;Rolling walker (2 wheels)       Vision Baseline Vision/History: 1 Wears glasses  Additional Comments: conjugate gaze; poor visual  attention; will further assess     Perception Perception Comments: will further assess   Praxis Praxis Praxis-Other Comments: appears intact    Pertinent Vitals/Pain Pain Assessment Pain Assessment: Faces Faces Pain Scale: Hurts even more Pain Location: headache Pain Descriptors / Indicators: Headache Pain Intervention(s): Limited activity within patient's tolerance     Hand Dominance Left   Extremity/Trunk Assessment Upper Extremity Assessment Upper Extremity Assessment: Generalized weakness   Lower Extremity Assessment Lower Extremity Assessment: Defer to PT evaluation   Cervical / Trunk Assessment Cervical / Trunk Assessment: Normal   Communication Communication Communication: No difficulties   Cognition Arousal/Alertness: Lethargic, Awake/alert Behavior During Therapy: Flat affect, Impulsive Overall Cognitive Status: Impaired/Different from baseline Area of Impairment: Rancho level, Orientation, Attention, Memory, Following commands, Safety/judgement, Awareness, Problem solving               Rancho Levels of Cognitive Functioning Rancho Los Amigos Scales of Cognitive Functioning: Confused, Appropriate (at least) Orientation Level: Disoriented to, Time, Situation Current Attention Level: Sustained Memory: Decreased short-term memory Following Commands: Follows one step commands consistently Safety/Judgement: Decreased awareness of safety, Decreased awareness of deficits Awareness: Intellectual Problem Solving: Slow processing, Decreased initiation, Requires verbal cues, Difficulty sequencing   [place] [address] Scales of Cognitive Functioning: Confused, Appropriate (at least)   General Comments       Exercises     Shoulder Instructions      Home Living Family/patient expects to be discharged to:: Private residence Living Arrangements: Alone Available Help at Discharge: Neighbor Type of Home: House Home Access: Stairs to enter Engineer, site of Steps: 5 Entrance Stairs-Rails: Right Home Layout: Two level;Bed/bath upstairs;1/2 bath on main level   Alternate Level Stairs-Rails: Right Bathroom Shower/Tub: Occupational psychologist: Standard Bathroom Accessibility: Yes How Accessible: Accessible via wheelchair;Accessible via walker Home Equipment:  (pt lethargic and drifted off during conversation)   Additional Comments: has a dog  Lives With: Alone    Prior Functioning/Environment Prior Level of Function : Independent/Modified Independent;Driving             Mobility Comments: Denies any use of AD at baseline          OT Problem List: Decreased activity tolerance;Impaired balance (sitting and/or standing);Impaired vision/perception;Decreased cognition;Decreased coordination;Decreased safety awareness;Decreased knowledge of use of DME or AE;Cardiopulmonary status limiting activity;Pain      OT Treatment/Interventions: Self-care/ADL training;Therapeutic exercise;Neuromuscular education;DME and/or AE instruction;Therapeutic activities;Cognitive remediation/compensation;Visual/perceptual remediation/compensation;Patient/family education;Balance training    OT Goals(Current goals can be found in the care plan section) Acute Rehab OT Goals Patient Stated Goal: to cancel her dinner plans with Mimmi OT Goal Formulation: Patient unable to participate in goal setting Time For Goal Achievement: 12/10/21 Potential to Achieve Goals: Good  OT Frequency: Min 2X/week    Co-evaluation PT/OT/SLP Co-Evaluation/Treatment: Yes Reason for Co-Treatment: For patient/therapist safety;To address functional/ADL transfers;Necessary to address cognition/behavior during functional activity   OT goals addressed during session: ADL's and self-care      AM-PAC OT "6 Clicks" Daily Activity     Outcome Measure Help from another person eating meals?: A Little Help from another person taking care of personal grooming?: A  Little Help from another person toileting, which includes using toliet, bedpan, or urinal?: A Lot Help from another person bathing (including washing, rinsing, drying)?: A Lot Help from another person to put on and taking off regular upper body clothing?: A Little Help from another person to put on and taking off regular  lower body clothing?: A Lot 6 Click Score: 15   End of Session Equipment Utilized During Treatment: Gait belt;Rolling walker (2 wheels) Nurse Communication: Mobility status  Activity Tolerance: Patient tolerated treatment well Patient left: in chair;with call bell/phone within reach;with chair alarm set  OT Visit Diagnosis: Unsteadiness on feet (R26.81);Other abnormalities of gait and mobility (R26.89);Muscle weakness (generalized) (M62.81);Other symptoms and signs involving cognitive function;Dizziness and giddiness (R42);Pain Pain - part of body:  (headache)                Time: 3825-0539 OT Time Calculation (min): 47 min Charges:  OT General Charges $OT Visit: 1 Visit OT Evaluation $OT Eval Moderate Complexity: [place], OT/L   Acute OT Clinical Specialist [place] Pager [phone] Office [phone]   [facility] 11/26/2021, 1:17 PM

## 2021-11-26 NOTE — Progress Notes (Signed)
   NAME:  Jean Bowen, MRN:  [id number], DOB:  1945/05/13, LOS: 1 ADMISSION DATE:  11/24/2021, CONSULTATION DATE:  8/10 REFERRING MD:  Christella Noa, CHIEF COMPLAINT:  Fall   History of Present Illness:  76 y/o female former smoker admitted with a skull fracture after tripping on a curb and hitting her head.  Admitted by NSGY, noted to have hypoxemia so PCCM was consulted.  Pertinent  Medical History  Major depressive disorder Arthritis  HTN HL Hypothyroidism  Hyperparathyroidism  Anemia hyperglycemia Skin cancer  Significant Hospital Events: Including procedures, antibiotic start and stop dates in addition to other pertinent events   8/8 admitted after being found altered after what appears to have been fall resulting in R) occiput skull fx, (L) frontal SDH w/ (L) temporal contusion. Admitted by neuro surg for obs. Hypoxic on presentation. PCCM asked to eval  Interim History / Subjective:  More awake Wants to get out of bed Has a headache  Objective   Blood pressure (!) 148/95, pulse (!) 103, temperature 98.9 F (37.2 C), temperature source Axillary, resp. rate 14, height '5\' 7"'$  (1.702 m), weight 80 kg, SpO2 96 %.        Intake/Output Summary (Last 24 hours) at 11/26/2021 6759 Last data filed at 11/26/2021 0600 Gross per 24 hour  Intake 1985.34 ml  Output 1250 ml  Net 735.34 ml   Filed Weights   11/24/21 2340  Weight: 80 kg    Examination:  General:  Resting comfortably in bed HENT: NCAT OP clear PULM: CTA B, normal effort CV: RRR, no mgr GI: BS+, soft, nontender MSK: normal bulk and tone Neuro: awake, alert, no distress, MAEW   Resolved Hospital Problem list     Assessment & Plan:  Traumatic right occipital skull fracture Acute left frontal SDH Left temporal lobe contusion PT/OT consult Pain control per NSGY SLP evaluation today Hold anticoagulants SBP goal < 140  Acute hypoxemia due to atelectasis Question underlying bronchiectasis vs ILD in former  smoker Will need outpatient overnight oximetry testing vs sleep study Will need outpatient HRCT and PFT Out of bed Incentive spirometry  6m LUL nodule> seen on CTA neck but not on CT chest Will f/u with outpatient HRCT  Aortic aneurysm 4.1 cm Outpatient repeat imaging  Hypertension with intracerebral hemorrhage Increase amlodipine Add labetalol  Hypothyroidism Continue synthroid  Major depression celexa  Best Practice (right click and "Reselect all SmartList Selections" daily)   Diet/type: Regular consistency (see orders) DVT prophylaxis: SCD GI prophylaxis: N/A Lines: N/A Foley:  N/A Code Status:  full code Last date of multidisciplinary goals of care discussion [per TRH]  Critical care time: n/a    BRoselie Awkward MD [place] Pager: [phone]: [phone] 7:00 pm call Elink  [phone]

## 2021-11-26 NOTE — Progress Notes (Signed)
Patient ID: Jean Bowen, female   DOB: [birth year], 76 y.o.   MRN: [id number] BP (!) 158/88   Pulse 83   Temp 99 F (37.2 C) (Oral)   Resp (!) 25   Ht '5\' 7"'$  (1.702 m)   Wt 80 kg   SpO2 96%   BMI 27.62 kg/m  Alert, following commands Perrl, full eom Confused at times Transferred to floor.  PT, OT recommending inpatient rehab.

## 2021-11-27 LAB — GLUCOSE, CAPILLARY
Glucose-Capillary: 120 mg/dL — ABNORMAL HIGH (ref 70–99)
Glucose-Capillary: 128 mg/dL — ABNORMAL HIGH (ref 70–99)
Glucose-Capillary: 130 mg/dL — ABNORMAL HIGH (ref 70–99)
Glucose-Capillary: 135 mg/dL — ABNORMAL HIGH (ref 70–99)
Glucose-Capillary: 138 mg/dL — ABNORMAL HIGH (ref 70–99)
Glucose-Capillary: 140 mg/dL — ABNORMAL HIGH (ref 70–99)
Glucose-Capillary: 171 mg/dL — ABNORMAL HIGH (ref 70–99)

## 2021-11-27 NOTE — TOC Progression Note (Signed)
Transition of Care [facility]) - Progression Note    Patient Details  Name: Jean Bowen MRN: [id number] Date of Birth: [birth year]  Transition of Care [facility] CM/SW [place], RN Phone [phone]  11/27/2021, 3:20 PM  Clinical Narrative:    CM updated per CIR that patient and step daughter prefer [facility] for Rehab. SW to follow for placement.         Expected Discharge Plan and Services                                                 Social Determinants of Health (SDOH) Interventions    Readmission Risk Interventions     No data to display

## 2021-11-27 NOTE — Telephone Encounter (Signed)
Patient scheduled 01/08/2022 for hospital follow up with Dr. Lake Bells.

## 2021-11-27 NOTE — Progress Notes (Signed)
This encounter was created in error - please disregard.

## 2021-11-27 NOTE — Progress Notes (Signed)
IP rehab admissions - I spoke with patient this am and I called her step daughter,Stephanie, at [phone].  Both are in favor of [facility] for rehab.  Patient went to [facility] about 3 years ago for rehab and her husband passed away at [facility].  Patient lives alone and her step daughter lives in [place].  I have let the case manager know that patient and step daughter would like [facility] for rehab.  Call me for questions.  [phone]

## 2021-11-27 NOTE — Care Management Important Message (Signed)
Important Message  Patient Details  Name: Jean Bowen MRN: [id number] Date of Birth: December 16, 1945   Medicare Important Message Given:  Yes     Hannah Beat 11/27/2021, 2:36 PM

## 2021-11-27 NOTE — Progress Notes (Signed)
Pt with increased confusion today, therapies also noted this when working with her. Pt continually attempted to pull off her 02, and when doing this would have 02 sats in the 80's. Near the end of the shift, pt got out of bed, self-removed her IV, and said she needed to go to the bathroom even though she had a purewick on at the time. Attempted to page Dr. Christella Noa, who is in surgery at the time. Also attempted to communicate this to on-call.   Justice Rocher, RN

## 2021-11-27 NOTE — Progress Notes (Signed)
Patient ID: Jean Bowen, female   DOB: 1945/06/17, 76 y.o.   MRN: [id number] BP (!) 143/84 (BP Location: Right Arm)   Pulse 73   Temp 98.7 F (37.1 C) (Axillary)   Resp 16   Ht '5\' 7"'$  (1.702 m)   Wt 80 kg   SpO2 95%   BMI 27.62 kg/m  Agitated today, pulled out iv, other lines. Confused. Moves all extremities No clear reason for confusion today. Will check labs.

## 2021-11-27 NOTE — Progress Notes (Addendum)
Occupational Therapy Treatment Patient Details Name: Jean Bowen MRN: [id number] DOB: Apr 03, 1946 Today's Date: 11/27/2021   History of present illness Pt is a 76 y.o. female who presented 11/24/21 s/p fall with head injury, unclear as to the situation. Pt sustained an occipital skull fracture, traumatic SAHs involving the bil frontal and temporal lobes as well as the R cerebellum traumatic, left temporal contusion, left frontal contusion, right cerebellar contusion. PMH: angioedema, HTN, OSA, osteoporosis, hypercholesterolemia, GERD, diverticulosis, depression   OT comments  Pt progressing towards acute OT goals. Up to recliner this session. Able to wash face with setup. Cognitive deficits remain, pt with limited insight into deficits "Why am I so confused?".  Lethargic in supine, more alert once upright. Updated d/c recommendation to reflect pt/family wishes for pt to d/c to familiar setting ([facility]) per discussion with Rehab Admissions Coordinator.    Recommendations for follow up therapy are one component of a multi-disciplinary discharge planning process, led by the attending physician.  Recommendations may be updated based on patient status, additional functional criteria and insurance authorization.    Follow Up Recommendations  Skilled nursing-short term rehab (<3 hours/day)    Assistance Recommended at Discharge Frequent or constant Supervision/Assistance  Patient can return home with the following  A little help with walking and/or transfers;A little help with bathing/dressing/bathroom;Assistance with cooking/housework;Direct supervision/assist for medications management;Direct supervision/assist for financial management;Assist for transportation;Help with stairs or ramp for entrance   Equipment Recommendations  Other (comment) (defer to next venue)    Recommendations for Other Services      Precautions / Restrictions Precautions Precautions: Fall;Other (comment) Precaution  Comments: SBP goal 130-150 Restrictions Weight Bearing Restrictions: No       Mobility Bed Mobility Overal bed mobility: Needs Assistance Bed Mobility: Supine to Sit     Supine to sit: HOB elevated, Min guard     General bed mobility comments: Min guard for safety coming to sit EOB, HOB elevated.    Transfers Overall transfer level: Needs assistance Equipment used: Rolling walker (2 wheels) Transfers: Sit to/from Stand, Bed to chair/wheelchair/BSC Sit to Stand: Min assist     Step pivot transfers: Min assist     General transfer comment: up to recliner. instability during dynamic standing requires rw and up to min A to steady. Cues to inititate and for safety     Balance Overall balance assessment: Needs assistance Sitting-balance support: Bilateral upper extremity supported, Single extremity supported, Feet supported Sitting balance-Leahy Scale: Poor     Standing balance support: Bilateral upper extremity supported, Single extremity supported Standing balance-Leahy Scale: Poor Standing balance comment: Reliant on UE support                           ADL either performed or assessed with clinical judgement   ADL Overall ADL's : Needs assistance/impaired     Grooming: Wash/dry face;Set up Grooming Details (indicate cue type and reason): washed face with setup at bed level. Suspect assist still needed for full grooming session d/t cognition mostly, also generalized weakness                 Toilet Transfer: Minimal assistance;Stand-pivot;BSC/3in1;Rolling walker (2 wheels) Toilet Transfer Details (indicate cue type and reason): simulated with bed to chair.           General ADL Comments: Pt completed 1 grooming tasks at bed level. then transferred to chair with min A. Cognition impacting ADL performance. Cues and increased time to initiate.  Extremity/Trunk Assessment Upper Extremity Assessment Upper Extremity Assessment: Generalized  weakness   Lower Extremity Assessment Lower Extremity Assessment: Defer to PT evaluation        Vision       Perception     Praxis      Cognition Arousal/Alertness: Lethargic, Awake/alert Behavior During Therapy: Flat affect Overall Cognitive Status: Impaired/Different from baseline Area of Impairment: Rancho level, Orientation, Attention, Memory, Following commands, Safety/judgement, Awareness, Problem solving               Rancho Levels of Cognitive Functioning Rancho Los Amigos Scales of Cognitive Functioning: Confused, Appropriate Orientation Level: Disoriented to, Time, Situation ("hospital", could not name city) Current Attention Level: Sustained Memory: Decreased short-term memory Following Commands: Follows one step commands consistently, Follows multi-step commands inconsistently, Follows multi-step commands with increased time Safety/Judgement: Decreased awareness of safety, Decreased awareness of deficits Awareness: Intellectual, Emergent ("Why am I so confused?") Problem Solving: Slow processing, Decreased initiation, Requires verbal cues, Difficulty sequencing General Comments: more alert once EOB. Confusion remains. Pt with some self-awareness of cognitive deficits but still limited in this regard. "Why am I so confused?" Able to state her dog's name "Shadow", increased time and further cueing for details such as age and size. Rancho [employer] Scales of Cognitive Functioning: Confused, Appropriate      Exercises      Shoulder Instructions       General Comments Pt on 3L throughout session with SpO2 90-93, 1x observed dip to 87.    Pertinent Vitals/ Pain       Pain Assessment Pain Assessment: Faces Faces Pain Scale: Hurts even more Pain Location: "I don't know" Pain Descriptors / Indicators: Discomfort Pain Intervention(s): Monitored during session, Limited activity within patient's tolerance, Repositioned  Home Living                                           Prior Functioning/Environment              Frequency  Min 2X/week        Progress Toward Goals  OT Goals(current goals can now be found in the care plan section)  Progress towards OT goals: Progressing toward goals  Acute Rehab OT Goals Patient Stated Goal: to make sure her dog is cared for OT Goal Formulation: Patient unable to participate in goal setting Time For Goal Achievement: 12/10/21 Potential to Achieve Goals: Good ADL Goals Pt Will Perform Lower Body Bathing: with supervision;with set-up;sit to/from stand Pt Will Perform Lower Body Dressing: with supervision;with set-up;sit to/from stand Pt Will Transfer to Toilet: with supervision;ambulating Pt Will Perform Toileting - Clothing Manipulation and hygiene: with supervision Additional ADL Goal #1: Demonstrate selective attention during AADL task in iminamlly distracting environment Additional ADL Goal #2: follow 2 step commands 75% of time in minimally distractin environment  Plan Discharge plan needs to be updated    Co-evaluation                 AM-PAC OT "6 Clicks" Daily Activity     Outcome Measure   Help from another person eating meals?: A Little Help from another person taking care of personal grooming?: A Little Help from another person toileting, which includes using toliet, bedpan, or urinal?: A Lot Help from another person bathing (including washing, rinsing, drying)?: A Lot Help from another person to put on and taking off regular  upper body clothing?: A Little Help from another person to put on and taking off regular lower body clothing?: A Lot 6 Click Score: 15    End of Session Equipment Utilized During Treatment: Rolling walker (2 wheels);Oxygen (3L)  OT Visit Diagnosis: Unsteadiness on feet (R26.81);Other abnormalities of gait and mobility (R26.89);Muscle weakness (generalized) (M62.81);Other symptoms and signs involving cognitive function;Dizziness and  giddiness (R42);Pain   Activity Tolerance Patient tolerated treatment well   Patient Left in chair;with call bell/phone within reach;with chair alarm set   Nurse Communication          Time: 0926-1000 OT Time Calculation (min): 34 min  Charges: OT General Charges $OT Visit: 1 Visit OT Treatments $Self Care/Home Management : 23-37 mins  Tyrone Schimke, OT Acute Rehabilitation Services Office: [phone]   Hortencia Pilar 11/27/2021, 11:10 AM

## 2021-11-27 NOTE — Progress Notes (Signed)
Physical Therapy Treatment Patient Details Name: Jean Bowen MRN: [id number] DOB: 08/17/1945 Today's Date: 11/27/2021   History of Present Illness Pt is a 76 y.o. female who presented 11/24/21 s/p fall with head injury, unclear as to the situation. Pt sustained an occipital skull fracture, traumatic SAHs involving the bil frontal and temporal lobes as well as the R cerebellum traumatic, left temporal contusion, left frontal contusion, right cerebellar contusion. PMH: angioedema, HTN, OSA, osteoporosis, hypercholesterolemia, GERD, diverticulosis, depression    PT Comments    Pt was able to ambulate within the room with minA x1 today using her RW, but remains limited in further mobility by her lethargy. Pt declined further mobility or exercises due to her desire to return to bed to nap. Encouraged pt to continue to try to make progress and mobilize to prevent deconditioning. Will continue to follow acutely. Updated d/c recommendation to reflect pt/family wishes for pt to d/c to familiar setting ([facility]) per discussion with Rehab Admissions Coordinator.    Recommendations for follow up therapy are one component of a multi-disciplinary discharge planning process, led by the attending physician.  Recommendations may be updated based on patient status, additional functional criteria and insurance authorization.  Follow Up Recommendations  Skilled nursing-short term rehab (<3 hours/day) Can patient physically be transported by private vehicle: Yes   Assistance Recommended at Discharge Frequent or constant Supervision/Assistance  Patient can return home with the following Assistance with cooking/housework;Direct supervision/assist for medications management;Direct supervision/assist for financial management;Assist for transportation;Help with stairs or ramp for entrance;A little help with walking and/or transfers;A little help with bathing/dressing/bathroom   Equipment Recommendations  Rolling  walker (2 wheels);BSC/3in1    Recommendations for Other Services       Precautions / Restrictions Precautions Precautions: Fall;Other (comment) Precaution Comments: SBP goal 130-150; watch SpO2 Restrictions Weight Bearing Restrictions: No     Mobility  Bed Mobility Overal bed mobility: Needs Assistance Bed Mobility: Supine to Sit, Sit to Supine     Supine to sit: HOB elevated, Min guard Sit to supine: Min guard, HOB elevated   General bed mobility comments: Min guard for safety, HOB elevated.    Transfers Overall transfer level: Needs assistance Equipment used: Rolling walker (2 wheels) Transfers: Sit to/from Stand Sit to Stand: Min assist           General transfer comment: MinA to steady with pt coming to stand 1x from bed and 1x from commode. Pt quick to power up to stand, displaying a trunk sway.    Ambulation/Gait Ambulation/Gait assistance: Min assist Gait Distance (Feet): 15 Feet (x2 bouts of ~15 ft each) Assistive device: Rolling walker (2 wheels) Gait Pattern/deviations: Step-through pattern, Decreased stride length Gait velocity: reduced Gait velocity interpretation: <1.31 ft/sec, indicative of household ambulator   General Gait Details: Pt needing cues and guidance at times to manage/turn RW or direct her to various surfaces. MinA for stability with pt demonstrating a trunk sway intermittently. Pt declined further due to lethargy   Stairs             Wheelchair Mobility    Modified Rankin (Stroke Patients Only)       Balance Overall balance assessment: Needs assistance Sitting-balance support: Bilateral upper extremity supported, Single extremity supported, Feet supported Sitting balance-Leahy Scale: Poor     Standing balance support: Bilateral upper extremity supported, Single extremity supported, No upper extremity supported, During functional activity Standing balance-Leahy Scale: Poor Standing balance comment: Reliant on UE  support for mobility or assistance for performing  tasks in standing without UE support                            Cognition Arousal/Alertness: Lethargic, Awake/alert Behavior During Therapy: Flat affect, Impulsive, Restless Overall Cognitive Status: Impaired/Different from baseline Area of Impairment: Rancho level, Attention, Memory, Following commands, Safety/judgement, Awareness, Problem solving               Rancho Levels of Cognitive Functioning Rancho Los Amigos Scales of Cognitive Functioning: Confused, Appropriate   Current Attention Level: Sustained Memory: Decreased short-term memory Following Commands: Follows one step commands consistently, Follows multi-step commands inconsistently, Follows multi-step commands with increased time Safety/Judgement: Decreased awareness of safety, Decreased awareness of deficits Awareness: Intellectual, Emergent Problem Solving: Slow processing, Decreased initiation, Requires verbal cues, Difficulty sequencing General Comments: Pt remains lethargic, but more awake once not supine. Needs encouragement to remain on task and progress mobility. Poor insight into benefits of mobility   Rancho [employer].es Scales of Cognitive Functioning: Confused, Appropriate    Exercises      General Comments General comments (skin integrity, edema, etc.): SpO2 down to 86% on RA, re-donned [place]      Pertinent Vitals/Pain Pain Assessment Pain Assessment: Faces Faces Pain Scale: No hurt Pain Intervention(s): Monitored during session    Home Living                          Prior Function            PT Goals (current goals can now be found in the care plan section) Acute Rehab PT Goals Patient Stated Goal: to sleep PT Goal Formulation: With patient Time For Goal Achievement: 12/09/21 Potential to Achieve Goals: Good Progress towards PT goals: Progressing toward goals    Frequency    Min 4X/week      PT Plan Discharge  plan needs to be updated    Co-evaluation              AM-PAC PT "6 Clicks" Mobility   Outcome Measure  Help needed turning from your back to your side while in a flat bed without using bedrails?: A Little Help needed moving from lying on your back to sitting on the side of a flat bed without using bedrails?: A Little Help needed moving to and from a bed to a chair (including a wheelchair)?: A Little Help needed standing up from a chair using your arms (e.g., wheelchair or bedside chair)?: A Little Help needed to walk in hospital room?: Total (<20 ft) Help needed climbing 3-5 steps with a railing? : Total 6 Click Score: 14    End of Session Equipment Utilized During Treatment: Gait belt;Oxygen Activity Tolerance: Patient tolerated treatment well;Patient limited by lethargy Patient left: with call bell/phone within reach;in bed;with bed alarm set Nurse Communication: Mobility status;Other (comment) (pt pulling at lines, including IV intermittently (none pulled out though)) PT Visit Diagnosis: Unsteadiness on feet (R26.81);Other abnormalities of gait and mobility (R26.89);Muscle weakness (generalized) (M62.81);Difficulty in walking, not elsewhere classified (R26.2)     Time: 0737-1062 PT Time Calculation (min) (ACUTE ONLY): 16 min  Charges:  $Gait Training: 8-22 mins                     Moishe Spice, PT, DPT Acute Rehabilitation Services  Office: [phone]    Orvan Falconer 11/27/2021, 5:01 PM

## 2021-11-28 LAB — BASIC METABOLIC PANEL
Anion gap: 6 (ref 5–15)
BUN: 13 mg/dL (ref 8–23)
CO2: 28 mmol/L (ref 22–32)
Calcium: 8.3 mg/dL — ABNORMAL LOW (ref 8.9–10.3)
Chloride: 104 mmol/L (ref 98–111)
Creatinine, Ser: 0.6 mg/dL (ref 0.44–1.00)
GFR, Estimated: >60 mL/min (ref >60)
Glucose, Bld: 114 mg/dL — ABNORMAL HIGH (ref 70–99)
Potassium: 3.7 mmol/L (ref 3.5–5.1)
Sodium: 138 mmol/L (ref 135–145)

## 2021-11-28 LAB — GLUCOSE, CAPILLARY
Glucose-Capillary: 115 mg/dL — ABNORMAL HIGH (ref 70–99)
Glucose-Capillary: 101 mg/dL — ABNORMAL HIGH (ref 70–99)
Glucose-Capillary: 100 mg/dL — ABNORMAL HIGH (ref 70–99)
Glucose-Capillary: 122 mg/dL — ABNORMAL HIGH (ref 70–99)
Glucose-Capillary: 118 mg/dL — ABNORMAL HIGH (ref 70–99)
Glucose-Capillary: 117 mg/dL — ABNORMAL HIGH (ref 70–99)

## 2021-11-28 NOTE — Progress Notes (Signed)
Subjective: Patient reports  feeling better less headache  Objective: Vital signs in last 24 hours: Temp:  [98.7 F (37.1 C)-99.4 F (37.4 C)] 99.4 F (37.4 C) (08/12 0357) Pulse Rate:  [65-73] 65 (08/12 0357) Resp:  [13-19] 13 (08/12 0357) BP: (121-150)/(74-88) 150/88 (08/12 0357) SpO2:  [93 %-96 %] 96 % (08/12 0357)  Intake/Output from previous day: 08/11 0701 - 08/12 0700 In: -  Out: 1100 [Urine:1100] Intake/Output this shift: No intake/output data recorded.  Awake alert oriented x1 still somewhat confused moves all extremities well  Lab Results: No results for input(s): "WBC", "HGB", "HCT", "PLT" in the last 72 hours. BMET Recent Labs    11/26/21 0242 11/28/21 0627  NA 138 138  K 3.7 3.7  CL 105 104  CO2 23 28  GLUCOSE 155* 114*  BUN 9 13  CREATININE 0.54 0.60  CALCIUM 8.3* 8.3*    Studies/Results: No results found.  Assessment/Plan: Mobilize with physical Occupational Therapy  LOS: 3 days     Elaina Hoops 11/28/2021, 8:02 AM

## 2021-11-28 NOTE — TOC CAGE-AID Note (Signed)
Transition of Care [facility]) - CAGE-AID Screening   Patient Details  Name: Jean Bowen MRN: [id number] Date of Birth: 05/29/1945  Clinical Narrative:  Patient admitted after a mechanical fall with head injury, has been confused and not fully oriented. Unable to complete an accurate screening at this time.  CAGE-AID Screening: Substance Abuse Screening unable to be completed due to: : Patient unable to participate

## 2021-11-29 LAB — GLUCOSE, CAPILLARY
Glucose-Capillary: 103 mg/dL — ABNORMAL HIGH (ref 70–99)
Glucose-Capillary: 107 mg/dL — ABNORMAL HIGH (ref 70–99)
Glucose-Capillary: 110 mg/dL — ABNORMAL HIGH (ref 70–99)
Glucose-Capillary: 143 mg/dL — ABNORMAL HIGH (ref 70–99)
Glucose-Capillary: 110 mg/dL — ABNORMAL HIGH (ref 70–99)

## 2021-11-29 NOTE — Progress Notes (Signed)
Pt seen on rounds today, awake and alert, +wernicke's, perseverating on the phrase "5 years" during orietnation questions, ox2, thinks she's been in the hospital for 5 years, states the year is 2025, etc. MAEx4  -no change in neurosurgical plan of care, continue therpaies, BMP from yesterday is unremarkable

## 2021-11-30 LAB — GLUCOSE, CAPILLARY
Glucose-Capillary: 104 mg/dL — ABNORMAL HIGH (ref 70–99)
Glucose-Capillary: 114 mg/dL — ABNORMAL HIGH (ref 70–99)
Glucose-Capillary: 118 mg/dL — ABNORMAL HIGH (ref 70–99)
Glucose-Capillary: 122 mg/dL — ABNORMAL HIGH (ref 70–99)
Glucose-Capillary: 124 mg/dL — ABNORMAL HIGH (ref 70–99)
Glucose-Capillary: 154 mg/dL — ABNORMAL HIGH (ref 70–99)
Glucose-Capillary: 187 mg/dL — ABNORMAL HIGH (ref 70–99)

## 2021-11-30 NOTE — Progress Notes (Signed)
Patient ID: Jean Bowen, female   DOB: May 31, 1945, 76 y.o.   MRN: [id number] BP 111/79 (BP Location: Right Arm)   Pulse 79   Temp 99.5 F (37.5 C) (Oral)   Resp 17   Ht '5\' 7"'$  (1.702 m)   Wt 80 kg   SpO2 92%   BMI 27.62 kg/m  Alert and oriented to person Confused, non fluent Word finding difficulties Will need a snf Waiting on offers

## 2021-11-30 NOTE — NC FL2 (Signed)
[place] MEDICAID FL2 LEVEL OF CARE SCREENING TOOL     IDENTIFICATION  Patient Name: Jean Bowen Birthdate: Aug 07, 1945 Sex: female Admission Date (Current Location): 11/24/2021  [facility] and [place] Number:  Herbalist and Address:  [facility] 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[place]) 11/25/2021   Hypoxia 11/25/2021   Nodule of upper lobe of left lung 11/25/2021   Aortic aneurysm, thoracic ([place]) 11/25/2021   Anemia 06/25/2019   Hyperglycemia 03/19/2019   Hyperparathyroidism ([place]) 02/12/2015   Thrombocytopenia ([place]) 02/11/2015   Dysphagia 03/02/2011   HYPERCHOLESTEROLEMIA 02/23/2010   Pruritus 01/08/2010   ANGIOEDEMA 12/11/2009   GERD 03/03/2007   Hypothyroidism 12/03/2006   Major depressive disorder, single episode, in remission ([place]) 12/03/2006   Essential hypertension 12/03/2006   Osteoporosis 12/03/2006   Overactive bladder 12/03/2006   COLONIC POLYPS, HX OF 12/03/2006    Orientation RESPIRATION BLADDER Height & Weight     Self, Place  Normal External catheter, Incontinent Weight: 176 lb 5.9 oz (80 kg) Height:  '5\' 7"'$  (170.2 cm)  BEHAVIORAL SYMPTOMS/MOOD NEUROLOGICAL BOWEL NUTRITION STATUS      Continent Diet (please see discharge summary)  AMBULATORY STATUS COMMUNICATION OF NEEDS Skin   Limited Assist Verbally Surgical wounds (wound/incision Laceration head, posterior 2 staples placed)                       Personal Care Assistance Level of Assistance  Bathing, Feeding, Dressing Bathing  Assistance: Limited assistance Feeding assistance: Independent Dressing Assistance: Limited assistance     Functional Limitations Info  Sight, Hearing, Speech Sight Info: Adequate Hearing Info: Adequate Speech Info: Adequate    SPECIAL CARE FACTORS FREQUENCY  PT (By licensed PT), OT (By licensed OT)     PT Frequency: 5x per week OT Frequency: 5xper week            Contractures      Additional Factors Info  Code Status, Allergies Code Status Info: FULL Allergies Info: Vibra-tab,Losartan Potassium,Morphine And Related           Current Medications (11/30/2021):  This is the current hospital active medication list Current Facility-Administered Medications  Medication Dose Route Frequency Provider Last Rate Last Admin   0.9 % NaCl with KCl 20 mEq/ L  infusion   Intravenous Continuous Ashok Pall, MD 75 mL/hr at 11/30/21 0700 New Bag at 11/30/21 0700   acetaminophen (TYLENOL) tablet 650 mg  650 mg Oral Q4H PRN Ashok Pall, MD   650 mg at 11/28/21 5027   acyclovir (ZOVIRAX) tablet 800 mg  800 mg Oral Daily Ashok Pall, MD   800 mg at 11/29/21 0923   amLODipine (NORVASC) tablet 10 mg  10 mg Oral Daily Ashok Pall, MD   10 mg at 11/30/21 0830   escitalopram (LEXAPRO) tablet 10 mg  10 mg Oral Daily Ashok Pall, MD   10 mg at 11/30/21 0830   HYDROcodone-acetaminophen (NORCO/VICODIN) 5-325 MG per tablet 1 tablet  1 tablet Oral Q4H PRN Ashok Pall, MD  1 tablet at 11/30/21 0830   hydrOXYzine (ATARAX) tablet 10 mg  10 mg Oral TID PRN Ashok Pall, MD   10 mg at 11/28/21 2004   labetalol (NORMODYNE) tablet 100 mg  100 mg Oral BID Ashok Pall, MD   100 mg at 11/30/21 0830   levothyroxine (SYNTHROID) tablet 25 mcg  25 mcg Oral Q0600 Ashok Pall, MD   25 mcg at 11/30/21 0553   ondansetron (ZOFRAN) tablet 4 mg  4 mg Oral Q6H PRN Ashok Pall, MD       Or   ondansetron (ZOFRAN) injection 4 mg  4 mg Intravenous Q6H PRN Ashok Pall, MD   4 mg at 11/26/21 0026    pantoprazole (PROTONIX) EC tablet 40 mg  40 mg Oral BID Ashok Pall, MD   40 mg at 11/30/21 0830     Discharge Medications: Please see discharge summary for a list of discharge medications.  Relevant Imaging Results:  Relevant Lab Results:   Additional Information SSN#  [id number]  Vinie Sill, LCSW

## 2021-11-30 NOTE — Progress Notes (Signed)
Physical Therapy Treatment Patient Details Name: Jean Bowen MRN: [id number] DOB: [birth year] Today's Date: 11/30/2021   History of Present Illness Pt is a 76 y.o. female who presented 11/24/21 s/p fall with head injury, unclear as to the situation. Pt sustained an occipital skull fracture, traumatic SAHs involving the bil frontal and temporal lobes as well as the R cerebellum traumatic, left temporal contusion, left frontal contusion, right cerebellar contusion. PMH: angioedema, HTN, OSA, osteoporosis, hypercholesterolemia, GERD, diverticulosis, depression    PT Comments    Patient with slow progress with mobility due to cognition and difficulty describing issues (likely headache limiting, but pt unable to state).  She was able to use the bathroom and completed toilet hygiene with improved sitting balance.  She will need SNF level rehab at d/c.  PT to continue to follow acutely.    Recommendations for follow up therapy are one component of a multi-disciplinary discharge planning process, led by the attending physician.  Recommendations may be updated based on patient status, additional functional criteria and insurance authorization.  Follow Up Recommendations  Skilled nursing-short term rehab (<3 hours/day)     Assistance Recommended at Discharge Frequent or constant Supervision/Assistance  Patient can return home with the following Assistance with cooking/housework;Direct supervision/assist for medications management;Direct supervision/assist for financial management;Assist for transportation;Help with stairs or ramp for entrance;A little help with walking and/or transfers;A little help with bathing/dressing/bathroom   Equipment Recommendations  Rolling walker (2 wheels);BSC/3in1    Recommendations for Other Services       Precautions / Restrictions Precautions Precautions: Fall;Other (comment) Precaution Comments: SBP goal 130-150; watch SpO2     Mobility  Bed Mobility Overal bed  mobility: Needs Assistance Bed Mobility: Supine to Sit     Supine to sit: Supervision, HOB elevated     General bed mobility comments: increased time, assist for lines    Transfers Overall transfer level: Needs assistance Equipment used: Rolling walker (2 wheels) Transfers: Sit to/from Stand Sit to Stand: Min assist           General transfer comment: assist for balance and anterior weight shift, up from EOB and from toilet in bathroom; sat in recliner with uncontrolled descent    Ambulation/Gait Ambulation/Gait assistance: Min assist Gait Distance (Feet): 10 Feet (x 2) Assistive device: Rolling walker (2 wheels) Gait Pattern/deviations: Decreased stride length, Step-to pattern, Trunk flexed       General Gait Details: slow and effortful, only willing to go to bathroom then to chair; she would not take redirection to walk further   Stairs             Wheelchair Mobility    Modified Rankin (Stroke Patients Only)       Balance Overall balance assessment: Needs assistance   Sitting balance-Leahy Scale: Good Sitting balance - Comments: sitting on toilet, able to complete hygiene   Standing balance support: Bilateral upper extremity supported, No upper extremity supported Standing balance-Leahy Scale: Poor Standing balance comment: min A for balance when backing up with walker and for hand washing in bathroom                            Cognition Arousal/Alertness: Awake/alert Behavior During Therapy: Flat affect Overall Cognitive Status: Impaired/Different from baseline Area of Impairment: Rancho level, Attention, Memory, Following commands, Safety/judgement, Awareness, Problem solving               Rancho Levels of Cognitive Functioning Rancho Los Amigos Scales of  Cognitive Functioning: Confused, Appropriate Orientation Level: Disoriented to, Time, Situation Current Attention Level: Sustained Memory: Decreased short-term  memory Following Commands: Follows one step commands consistently, Follows multi-step commands inconsistently, Follows multi-step commands with increased time Safety/Judgement: Decreased awareness of safety, Decreased awareness of deficits   Problem Solving: Slow processing, Decreased initiation, Requires verbal cues, Difficulty sequencing     Rancho [employer] Scales of Cognitive Functioning: Confused, Appropriate    Exercises      General Comments General comments (skin integrity, edema, etc.): on 3L O2 throughout with SpO2 WNL      Pertinent Vitals/Pain Pain Assessment Faces Pain Scale: Hurts little more Pain Location: unable to state Pain Descriptors / Indicators: Discomfort, Grimacing Pain Intervention(s): Monitored during session, Patient requesting pain meds-RN notified    Home Living                          Prior Function            PT Goals (current goals can now be found in the care plan section) Progress towards PT goals: Progressing toward goals (slowly)    Frequency    Min 3X/week      PT Plan Current plan remains appropriate;Frequency needs to be updated    Co-evaluation              AM-PAC PT "6 Clicks" Mobility   Outcome Measure  Help needed turning from your back to your side while in a flat bed without using bedrails?: A Little Help needed moving from lying on your back to sitting on the side of a flat bed without using bedrails?: A Little Help needed moving to and from a bed to a chair (including a wheelchair)?: A Little Help needed standing up from a chair using your arms (e.g., wheelchair or bedside chair)?: A Little Help needed to walk in hospital room?: Total Help needed climbing 3-5 steps with a railing? : Total 6 Click Score: 14    End of Session Equipment Utilized During Treatment: Gait belt;Oxygen Activity Tolerance: Patient limited by fatigue Patient left: in chair;with call bell/phone within reach;with chair  alarm set Nurse Communication: Mobility status PT Visit Diagnosis: Unsteadiness on feet (R26.81);Other abnormalities of gait and mobility (R26.89);Muscle weakness (generalized) (M62.81);Difficulty in walking, not elsewhere classified (R26.2)     Time: 8563-1497 PT Time Calculation (min) (ACUTE ONLY): 28 min  Charges:  $Gait Training: 8-22 mins $Therapeutic Activity: 8-22 mins                     Magda Kiel, PT Acute Rehabilitation Services Office:[phone] 11/30/2021    Reginia Naas 11/30/2021, 4:38 PM

## 2021-11-30 NOTE — TOC Initial Note (Signed)
Transition of Care [facility]) - Initial/Assessment Note    Patient Details  Name: Jean Bowen MRN: [id number] Date of Birth: 21-Aug-1945  [facility] CM/SW Contact:    Vinie Sill, LCSW Phone Number: 11/30/2021, 12:27 PM  Clinical Narrative:                  CSW spoke with step-daughter,Stephanie via phone. CSW introduced self and explained role. CSW confirmed SNF choice is [facility]. She states patient has been there before and is aware of their financial obligations. CSW informed, [facility] reserve SNF placement for their residents but will call and confirm.  CSW spoke with Donna/[facility]- she had advise, unless patient is a current resident, they are unable to offer. They must retain beds for their residents.   Patient's family was updated -she states understanding and gave CSW permission to fax referrals to other local SNFs.   TOC will provide bed offers once available  TOC will continue to follow and assist with discharge planning.  Thurmond Butts, MSW, LCSW Clinical Social Worker    Expected Discharge Plan: Skilled Nursing Facility Barriers to Discharge: Continued Medical Work up   Patient Goals and CMS Choice        Expected Discharge Plan and Services Expected Discharge Plan: [place] In-house Referral: Clinical Social Work     Living arrangements for the past 2 months: [place]                                      Prior Living Arrangements/Services Living arrangements for the past 2 months: Single Family Home Lives with:: Self Patient language and need for interpreter reviewed:: No                 Activities of Daily Living      Permission Sought/Granted                  Emotional Assessment       Orientation: : Oriented to Self Alcohol / Substance Use: Not Applicable Psych Involvement: No (comment)  Admission diagnosis:  Subdural hematoma ([place]) [S06.5XAA] Traumatic  intracerebral hemorrhage ([place]) [S06.36AA] Traumatic subarachnoid hemorrhage with loss of consciousness of 30 minutes or less, initial encounter ([place]) [S06.6X1A] Fall, initial encounter [W19.XXXA] Other closed fracture of right side of occipital bone, initial encounter ([place]) [S02.11GA] Patient Active Problem List   Diagnosis Date Noted   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[place]) 11/25/2021   Hypoxia 11/25/2021   Nodule of upper lobe of left lung 11/25/2021   Aortic aneurysm, thoracic ([place]) 11/25/2021   Anemia 06/25/2019   Hyperglycemia 03/19/2019   Hyperparathyroidism ([place]) 02/12/2015   Thrombocytopenia ([place]) 02/11/2015   Dysphagia 03/02/2011   HYPERCHOLESTEROLEMIA 02/23/2010   Pruritus 01/08/2010   ANGIOEDEMA 12/11/2009   GERD 03/03/2007   Hypothyroidism 12/03/2006   Major depressive disorder, single episode, in remission ([place]) 12/03/2006   Essential hypertension 12/03/2006   Osteoporosis 12/03/2006   Overactive bladder 12/03/2006   COLONIC POLYPS, HX OF 12/03/2006   PCP:  Vivi Barrack, MD Pharmacy:   [facility], [place] [address] [employer] [place] [address] Phone: [phone] Fax: [place] [phone] - [place], [place] [address] [place] [address] Phone: [phone] Fax: [phone]  [place], [place] - [address]  [address] [place] [address] Phone: [phone] Fax: [phone]     Social Determinants of Health (SDOH) Interventions    Readmission Risk Interventions     No data to display

## 2021-12-01 LAB — GLUCOSE, CAPILLARY
Glucose-Capillary: 117 mg/dL — ABNORMAL HIGH (ref 70–99)
Glucose-Capillary: 122 mg/dL — ABNORMAL HIGH (ref 70–99)
Glucose-Capillary: 123 mg/dL — ABNORMAL HIGH (ref 70–99)
Glucose-Capillary: 129 mg/dL — ABNORMAL HIGH (ref 70–99)
Glucose-Capillary: 136 mg/dL — ABNORMAL HIGH (ref 70–99)
Glucose-Capillary: 95 mg/dL (ref 70–99)

## 2021-12-01 NOTE — TOC Progression Note (Signed)
Transition of Care [facility]) - Progression Note    Patient Details  Name: Jean Bowen MRN: [id number] Date of Birth: [birth year]  Transition of Care [facility]) CM/SW [place], [place] Phone Number: 12/01/2021, 12:42 PM  Clinical Narrative:     CSW visit with patient and family friend,Mimi- CSW introduced  self and explained role. CSW provided family friend w/bed offers and medicare.gov star ratings.   CSW will follow up w/choice  TOC will continue to follow and assist with discharge planning.   Thurmond Butts, MSW, LCSW Clinical Social Worker     Expected Discharge Plan: Skilled Nursing Facility Barriers to Discharge: Continued Medical Work up  Expected Discharge Plan and Services Expected Discharge Plan: [place] In-house Referral: Clinical Social Work     Living arrangements for the past 2 months: Single Family Home                                       Social Determinants of Health (SDOH) Interventions    Readmission Risk Interventions     No data to display

## 2021-12-01 NOTE — Progress Notes (Signed)
Patient ID: Jean Bowen, female   DOB: March 22, 1946, 76 y.o.   MRN: [id number] BP 123/77 (BP Location: Right Arm)   Pulse 65   Temp 98 F (36.7 C) (Oral)   Resp 14   Ht '5\' 7"'$  (1.702 m)   Wt 80 kg   SpO2 96%   BMI 27.62 kg/m  Alert, will follow commands. Expressive aphasia Moving all extremities Perrl, full eom Tongue and uvula midline Awaiting placement

## 2021-12-01 NOTE — TOC Progression Note (Signed)
Transition of Care [facility]) - Progression Note    Patient Details  Name: Kandice Schmelter MRN: [id number] Date of Birth: 1945/10/24  Transition of Care [facility]) CM/SW [place], [place] Phone Number: 12/01/2021, 1:58 PM  Clinical Narrative:     CSW LVM  w/ [place]- provided  bed offers for patient - waiting on call back  Expected Discharge Plan: [place] Barriers to Discharge: Continued Medical Work up  Expected Discharge Plan and Services Expected Discharge Plan: [place] In-house Referral: Clinical Social Work     Living arrangements for the past 2 months: Single Family Home                                       Social Determinants of Health (SDOH) Interventions    Readmission Risk Interventions     No data to display

## 2021-12-01 NOTE — Progress Notes (Signed)
PT Cancellation Note  Patient Details Name: Cailin Gebel MRN: [id number] DOB: 1946-03-02   Cancelled Treatment:    Reason Eval/Treat Not Completed: Patient declined, no reason specified; patient just starting dinner tray, assisted to set up for her and encouraged up to chair, but she refused up to chair to eat.  Will continue attempts.    Reginia Naas 12/01/2021, 5:19 PM Magda Kiel, PT Acute Rehabilitation Services Office:[phone] 12/01/2021

## 2021-12-01 NOTE — Progress Notes (Signed)
Pt had a 3 beats Runs of V Tach

## 2021-12-02 LAB — GLUCOSE, CAPILLARY
Glucose-Capillary: 116 mg/dL — ABNORMAL HIGH (ref 70–99)
Glucose-Capillary: 121 mg/dL — ABNORMAL HIGH (ref 70–99)
Glucose-Capillary: 154 mg/dL — ABNORMAL HIGH (ref 70–99)
Glucose-Capillary: 128 mg/dL — ABNORMAL HIGH (ref 70–99)
Glucose-Capillary: 113 mg/dL — ABNORMAL HIGH (ref 70–99)
Glucose-Capillary: 126 mg/dL — ABNORMAL HIGH (ref 70–99)

## 2021-12-02 NOTE — Progress Notes (Signed)
Patient ID: Jean Bowen, female   DOB: 07/03/1945, 76 y.o.   MRN: [id number] BP (!) 153/86 (BP Location: Left Arm)   Pulse 71   Temp 99.6 F (37.6 C) (Oral)   Resp 17   Ht '5\' 7"'$  (1.702 m)   Wt 80 kg   SpO2 96%   BMI 27.62 kg/m  Alert, expressive aphasia The aphasia has been both documented and known by the neurosurgical team.  Continue with speech therapy.  Placement remains an issue.

## 2021-12-02 NOTE — Progress Notes (Signed)
Physical Therapy Treatment Patient Details Name: Shaakira Borrero MRN: [id number] DOB: 07-29-1945 Today's Date: 12/02/2021   History of Present Illness Pt is a 76 y.o. female who presented 11/24/21 s/p fall with head injury, unclear as to the situation. Pt sustained an occipital skull fracture, traumatic SAHs involving the bil frontal and temporal lobes as well as the R cerebellum traumatic, left temporal contusion, left frontal contusion, right cerebellar contusion. PMH: angioedema, HTN, OSA, osteoporosis, hypercholesterolemia, GERD, diverticulosis, depression    PT Comments    Patient progressing ambulation walking to doorway with encouragement.  She was up much of the morning and early afternoon per NT and toileted in bathroom prior to walking so fatigued quickly.  She remains high fall risk due to attention and awareness.  She will need STSNF level rehab at d/c.  PT will continue to follow acutely.    Recommendations for follow up therapy are one component of a multi-disciplinary discharge planning process, led by the attending physician.  Recommendations may be updated based on patient status, additional functional criteria and insurance authorization.  Follow Up Recommendations  Skilled nursing-short term rehab (<3 hours/day) Can patient physically be transported by private vehicle: Yes   Assistance Recommended at Discharge Frequent or constant Supervision/Assistance  Patient can return home with the following Assistance with cooking/housework;Direct supervision/assist for medications management;Direct supervision/assist for financial management;Assist for transportation;Help with stairs or ramp for entrance;A little help with walking and/or transfers;A little help with bathing/dressing/bathroom   Equipment Recommendations  Rolling walker (2 wheels);BSC/3in1    Recommendations for Other Services       Precautions / Restrictions Precautions Precautions: Fall;Other (comment) Precaution  Comments: SBP goal 130-150; watch SpO2     Mobility  Bed Mobility Overal bed mobility: Needs Assistance Bed Mobility: Supine to Sit, Sit to Supine     Supine to sit: Min guard Sit to supine: Supervision   General bed mobility comments: attempting up to bathroom and bed alarm going off; once rail down increased time and S to rise up to sit, assist for lines; then to supine assist for positioning    Transfers Overall transfer level: Needs assistance Equipment used: Rolling walker (2 wheels) Transfers: Sit to/from Stand Sit to Stand: Min assist, Supervision           General transfer comment: up from EOB assist for balance, from toilet in bathroom up on her own    Ambulation/Gait Ambulation/Gait assistance: Min assist Gait Distance (Feet): 30 Feet Assistive device: Rolling walker (2 wheels) Gait Pattern/deviations: Step-to pattern, Decreased stride length, Shuffle       General Gait Details: hesitant and slow; did finally agree and walked to door and back after toileting in the bathroom, assist for RW and balance   Stairs             Wheelchair Mobility    Modified Rankin (Stroke Patients Only)       Balance Overall balance assessment: Needs assistance   Sitting balance-Leahy Scale: Good     Standing balance support: No upper extremity supported Standing balance-Leahy Scale: Fair Standing balance comment: can stand without UE support, but needs close S for safety as dynamic balance and awareness poor with high fall risk                            Cognition Arousal/Alertness: Awake/alert Behavior During Therapy: Flat affect Overall Cognitive Status: Impaired/Different from baseline Area of Impairment: Orientation, Attention, Memory, Following commands  Rancho Levels of Cognitive Functioning Rancho Los Amigos Scales of Cognitive Functioning: Confused, Appropriate Orientation Level: Disoriented to, Place, Time,  Situation Current Attention Level: Sustained Memory: Decreased short-term memory Following Commands: Follows one step commands consistently, Follows multi-step commands inconsistently, Follows multi-step commands with increased time Safety/Judgement: Decreased awareness of safety, Decreased awareness of deficits   Problem Solving: Slow processing, Decreased initiation, Requires verbal cues, Difficulty sequencing General Comments: difficulty seqencing washing her hands, mod cues and assist to complete   [employer] Scales of Cognitive Functioning: Confused, Appropriate    Exercises      General Comments General comments (skin integrity, edema, etc.): on 2L O2 throughout session      Pertinent Vitals/Pain Pain Assessment Faces Pain Scale: Hurts little more Pain Location: stomach Pain Descriptors / Indicators: Aching Pain Intervention(s): Monitored during session, Repositioned (assisted to bathroom)    Home Living                          Prior Function            PT Goals (current goals can now be found in the care plan section) Progress towards PT goals: Progressing toward goals    Frequency    Min 3X/week      PT Plan Current plan remains appropriate    Co-evaluation              AM-PAC PT "6 Clicks" Mobility   Outcome Measure  Help needed turning from your back to your side while in a flat bed without using bedrails?: A Little Help needed moving from lying on your back to sitting on the side of a flat bed without using bedrails?: A Little Help needed moving to and from a bed to a chair (including a wheelchair)?: A Little Help needed standing up from a chair using your arms (e.g., wheelchair or bedside chair)?: A Little Help needed to walk in hospital room?: A Little Help needed climbing 3-5 steps with a railing? : Total 6 Click Score: 16    End of Session Equipment Utilized During Treatment: Gait belt;Oxygen Activity Tolerance:  Patient limited by fatigue Patient left: in bed;with call bell/phone within reach;with bed alarm set   PT Visit Diagnosis: Unsteadiness on feet (R26.81);Other abnormalities of gait and mobility (R26.89);Muscle weakness (generalized) (M62.81);Difficulty in walking, not elsewhere classified (R26.2)     Time: 1610-9604 PT Time Calculation (min) (ACUTE ONLY): 27 min  Charges:  $Gait Training: 8-22 mins $Therapeutic Activity: 8-22 mins                     Magda Kiel, PT Acute Rehabilitation Services Office:[phone] 12/02/2021    Reginia Naas 12/02/2021, 6:04 PM

## 2021-12-02 NOTE — Progress Notes (Signed)
Speech Language Pathology Treatment: Cognitive-Linquistic  Patient Details Name: Jean Bowen MRN: [id number] DOB: 1945/11/11 Today's Date: 12/02/2021 Time: 6295-2841 SLP Time Calculation (min) (ACUTE ONLY): 19 min  Assessment / Plan / Recommendation Clinical Impression  Pt with new expressive receptive language difficulties this date as compared to initial SLP eval (8/9), which only noted cognitive deficits. Expressive and receptive language documented as WFL at that time. This date, pt with halting, word finding, neologisms and phonemic paraphasias during sentence level utterances. She named 2/5 items during a confrontational  naming task with perseveration noted on few items. Sentence completion (inconsistently) and written cues assisted during word finding difficulties . She responds to simple y/n questions with 100% accuracy, demonstrating difficulty with mod-complex level questions, including in relation to pt's needs. She benefited from gestural cues when asked questions regarding her wants/needs. Discussed with RN concern regarding pt's change in status in regards to language function since initial SLP eval. Unsure of when change occurred, but RN will make MD aware. Will continue f/u during acute stay.    HPI HPI: 76yo female admitted 11/25/21 after a fall with right occipital fracture, Left frontal ADH, left temporal contusion. PMH: angioedema, depression, dermatitis, diverticulosis, GERD, hiatal hernia, benign castric tumor, hypercholesterolemia, HTN, OSA, osteoporosis      SLP Plan  Continue with current plan of care      Recommendations for follow up therapy are one component of a multi-disciplinary discharge planning process, led by the attending physician.  Recommendations may be updated based on patient status, additional functional criteria and insurance authorization.    Recommendations                   Follow Up Recommendations: Skilled nursing-short term rehab (<3  hours/day) Assistance recommended at discharge: Intermittent Supervision/Assistance SLP Visit Diagnosis: Aphasia (R47.01);Cognitive communication deficit (R41.841) Plan: Continue with current plan of care            Jean Bowen, Jean Bowen, Jean Bowen Office Number: [phone]  Acie Fredrickson  12/02/2021, 12:53 PM

## 2021-12-02 NOTE — Progress Notes (Signed)
Occupational Therapy Treatment Patient Details Name: Jean Bowen MRN: [id number] DOB: 30-Apr-1945 Today's Date: 12/02/2021   History of present illness Pt is a 76 y.o. female who presented 11/24/21 s/p fall with head injury, unclear as to the situation. Pt sustained an occipital skull fracture, traumatic SAHs involving the bil frontal and temporal lobes as well as the R cerebellum traumatic, left temporal contusion, left frontal contusion, right cerebellar contusion. PMH: angioedema, HTN, OSA, osteoporosis, hypercholesterolemia, GERD, diverticulosis, depression   OT comments  Pt progressing towards acute OT goals. Worked on 2 step command following during ADL tasks, deficits remain. Pt walked to/from bathroom to toilet, min A. On 2.5L O2 at start of sessions with SpO2 92-94. Trialed RA during toileting tasks with SpO2 dropping to 88-90 (lowest 87), no increased WOB. Appeared dizzy during OOB ADLs. Reapplied 2.5L O2 at end of session with SpO2 92-93. D/c recommendation remains appropriate.    Recommendations for follow up therapy are one component of a multi-disciplinary discharge planning process, led by the attending physician.  Recommendations may be updated based on patient status, additional functional criteria and insurance authorization.    Follow Up Recommendations  Skilled nursing-short term rehab (<3 hours/day)    Assistance Recommended at Discharge Frequent or constant Supervision/Assistance  Patient can return home with the following  A little help with walking and/or transfers;A little help with bathing/dressing/bathroom;Assistance with cooking/housework;Direct supervision/assist for medications management;Direct supervision/assist for financial management;Assist for transportation;Help with stairs or ramp for entrance   Equipment Recommendations  Other (comment)    Recommendations for Other Services      Precautions / Restrictions Precautions Precautions: Fall;Other  (comment) Precaution Comments: SBP goal 130-150; watch SpO2 Restrictions Weight Bearing Restrictions: No       Mobility Bed Mobility               General bed mobility comments: sitting in recliner at start and end of session    Transfers Overall transfer level: Needs assistance Equipment used: Rolling walker (2 wheels) Transfers: Sit to/from Stand Sit to Stand: Min assist           General transfer comment: to/from recliner and comfort height toilet. assist for stability and walker management     Balance Overall balance assessment: Needs assistance Sitting-balance support: Bilateral upper extremity supported, Single extremity supported, Feet supported Sitting balance-Leahy Scale: Good     Standing balance support: Bilateral upper extremity supported, No upper extremity supported Standing balance-Leahy Scale: Poor                             ADL either performed or assessed with clinical judgement   ADL Overall ADL's : Needs assistance/impaired     Grooming: Set up;Wash/dry hands;Sitting                   Toilet Transfer: Minimal assistance;Ambulation;Comfort height toilet;Rolling walker (2 wheels) Toilet Transfer Details (indicate cue type and reason): cues for squencing with walker assist to navigate walker during turns. Toileting- Clothing Manipulation and Hygiene: Moderate assistance;Sit to/from stand Toileting - Clothing Manipulation Details (indicate cue type and reason): stood with close min guard, needs BUE support in standing. total assist for pericare task       General ADL Comments: Planned with pt to walk to sink to comb her hair. Pt appearing confused at entrance to bathroom and went to toilet. Followed pt's lead on this. Appeared somehwat dizzy on toilet but able to cautiously walk to  recliner to end session. Worked on 2 step command following during ADLs, deficits remain.    Extremity/Trunk Assessment Upper Extremity  Assessment Upper Extremity Assessment: Generalized weakness   Lower Extremity Assessment Lower Extremity Assessment: Defer to PT evaluation        Vision       Perception     Praxis      Cognition Arousal/Alertness: Awake/alert Behavior During Therapy: Flat affect Overall Cognitive Status: Impaired/Different from baseline Area of Impairment: Rancho level, Attention, Memory, Following commands, Safety/judgement, Awareness, Problem solving               Rancho Levels of Cognitive Functioning Rancho Los Amigos Scales of Cognitive Functioning: Confused, Appropriate Orientation Level: Disoriented to, Time, Situation Current Attention Level: Sustained Memory: Decreased short-term memory Following Commands: Follows one step commands consistently, Follows multi-step commands inconsistently, Follows multi-step commands with increased time Safety/Judgement: Decreased awareness of safety, Decreased awareness of deficits Awareness: Intellectual, Emergent Problem Solving: Slow processing, Decreased initiation, Requires verbal cues, Difficulty sequencing General Comments: Improved attention to tasks with reduced environmental distractions. Consistent one step command following though some decreased initiation time. Struggled with 2 step commands and remaining focused to task. Rancho [employer] Scales of Cognitive Functioning: Confused, Appropriate      Exercises      Shoulder Instructions       General Comments On 2.5L O2 via [place] at start and end of session. RA to complete toileting tasks with SpO2 88-90, lowest 87. Appeared dizzy with OOB activity but no increased WOB. SpO2 92-93 at end of session, sitting in recliner back on 2.5L O2    Pertinent Vitals/ Pain       Pain Assessment Pain Assessment: Faces Faces Pain Scale: Hurts little more Pain Location: unable to state Pain Descriptors / Indicators: Discomfort, Grimacing Pain Intervention(s): Monitored during session,  Limited activity within patient's tolerance, Repositioned  Home Living                                          Prior Functioning/Environment              Frequency  Min 2X/week        Progress Toward Goals  OT Goals(current goals can now be found in the care plan section)  Progress towards OT goals: Progressing toward goals  Acute Rehab OT Goals Patient Stated Goal: not stated this session Time For Goal Achievement: 12/10/21 Potential to Achieve Goals: Good ADL Goals Pt Will Perform Lower Body Bathing: with supervision;with set-up;sit to/from stand Pt Will Perform Lower Body Dressing: with supervision;with set-up;sit to/from stand Pt Will Transfer to Toilet: with supervision;ambulating Pt Will Perform Toileting - Clothing Manipulation and hygiene: with supervision Additional ADL Goal #1: Demonstrate selective attention during AADL task in iminamlly distracting environment Additional ADL Goal #2: follow 2 step commands 75% of time in minimally distractin environment  Plan Discharge plan needs to be updated    Co-evaluation                 AM-PAC OT "6 Clicks" Daily Activity     Outcome Measure   Help from another person eating meals?: A Little Help from another person taking care of personal grooming?: A Little Help from another person toileting, which includes using toliet, bedpan, or urinal?: A Lot Help from another person bathing (including washing, rinsing, drying)?: A Lot Help from another person  to put on and taking off regular upper body clothing?: A Little Help from another person to put on and taking off regular lower body clothing?: A Lot 6 Click Score: 15    End of Session Equipment Utilized During Treatment: Rolling walker (2 wheels);Oxygen (RA to toilet)  OT Visit Diagnosis: Unsteadiness on feet (R26.81);Other abnormalities of gait and mobility (R26.89);Muscle weakness (generalized) (M62.81);Other symptoms and signs involving  cognitive function;Dizziness and giddiness (R42);Pain   Activity Tolerance Patient tolerated treatment well   Patient Left in chair;with call bell/phone within reach;with chair alarm set   Nurse Communication Other (comment) (SpO2 readings, trialed RA, back on 2.5L O2 at end of session)        Time: 3354-5625 OT Time Calculation (min): 26 min  Charges: OT General Charges $OT Visit: 1 Visit OT Treatments $Self Care/Home Management : 23-37 mins  Tyrone Schimke, OT Acute Rehabilitation Services Office: [phone]   Hortencia Pilar 12/02/2021, 12:16 PM

## 2021-12-03 LAB — GLUCOSE, CAPILLARY
Glucose-Capillary: 120 mg/dL — ABNORMAL HIGH (ref 70–99)
Glucose-Capillary: 127 mg/dL — ABNORMAL HIGH (ref 70–99)
Glucose-Capillary: 111 mg/dL — ABNORMAL HIGH (ref 70–99)
Glucose-Capillary: 143 mg/dL — ABNORMAL HIGH (ref 70–99)

## 2021-12-03 MED ORDER — LABETALOL HCL 5 MG/ML IV SOLN: 10.0000 mg | INTRAVENOUS | Status: DC | PRN

## 2021-12-03 NOTE — TOC Progression Note (Signed)
Transition of Care [facility]) - Progression Note    Patient Details  Name: Jean Bowen MRN: [id number] Date of Birth: 01/14/1946  Transition of Care [facility]) CM/SW [place], [place] Phone Number: 12/03/2021, 2:59 PM  Clinical Narrative:     CSW talked with Butch Penny at [facility]- again she confirmed they can  not accept patient.   CSW left voice message w/patient's friend Mimi.  TOC will continue to follow and assist with discharge planning.  Expected Discharge Plan: [place] Barriers to Discharge: Continued Medical Work up  Expected Discharge Plan and Services Expected Discharge Plan: [place] In-house Referral: Clinical Social Work     Living arrangements for the past 2 months: Single Family Home                                       Social Determinants of Health (SDOH) Interventions    Readmission Risk Interventions     No data to display

## 2021-12-03 NOTE — TOC Progression Note (Signed)
Transition of Care [facility]) - Progression Note    Patient Details  Name: Jean Bowen MRN: [id number] Date of Birth: [birth year]  Transition of Care [facility]) CM/SW [place], [place] Phone Number: 12/03/2021, 9:01 AM  Clinical Narrative:     CSW spoke with patient's step-daughter-informed need SNF choice- she states she will call CSW this morning with SNF choice.     Expected Discharge Plan: [place] Barriers to Discharge: Continued Medical Work up  Expected Discharge Plan and Services Expected Discharge Plan: [place] In-house Referral: Clinical Social Work     Living arrangements for the past 2 months: Single Family Home                                       Social Determinants of Health (SDOH) Interventions    Readmission Risk Interventions     No data to display

## 2021-12-03 NOTE — TOC Progression Note (Signed)
Transition of Care [facility]) - Progression Note    Patient Details  Name: Jean Bowen MRN: [id number] Date of Birth: [birth year]  Transition of Care [facility]) CM/SW [place], [place] Phone Number: 12/03/2021, 1:38 PM  Clinical Narrative:     CSW called stepdaughter,Stephanie, left voice message to return call. Spoke with patient's friend Mimi- she reports they are not happy with  SNF choices- she reports they have a friend, that is trying to get the patient into [facility]. She is adamant about the patient going to [facility] & no other SNF. [facility] has previous stated no availability and they can not accept patient's that is not a resident of their facility. CSW explained, will have [facility] to contact family/friend.  CSW left voice message for Donna/Admission   Thurmond Butts, MSW, LCSW Clinical Social Worker     Expected Discharge Plan: [employer] Barriers to Discharge: Continued Medical Work up  Expected Discharge Plan and Services Expected Discharge Plan: [place] In-house Referral: Clinical Social Work     Living arrangements for the past 2 months: Single Family Home                                       Social Determinants of Health (SDOH) Interventions    Readmission Risk Interventions     No data to display

## 2021-12-03 NOTE — Progress Notes (Signed)
Patient's belongings, purse, tablet, and cell phone sent home with [place]. She states her smart watch is missing.

## 2021-12-03 NOTE — Plan of Care (Signed)

## 2021-12-03 NOTE — Plan of Care (Signed)
  Problem: Education: Goal: Knowledge of General Education information will improve Description: Including pain rating scale, medication(s)/side effects and non-pharmacologic comfort measures Outcome: Progressing   Problem: Health Behavior/Discharge Planning: Goal: Ability to manage health-related needs will improve Outcome: Progressing   Problem: Clinical Measurements: Goal: Ability to maintain clinical measurements within normal limits will improve Outcome: Progressing Goal: Respiratory complications will improve Outcome: Progressing   Problem: Nutrition: Goal: Adequate nutrition will be maintained Outcome: Progressing

## 2021-12-03 NOTE — Progress Notes (Signed)
Patient ID: Jean Bowen, female   DOB: [birth year], 76 y.o.   MRN: [id number] BP 138/81 (BP Location: Right Arm)   Pulse 70   Temp 98 F (36.7 C) (Oral)   Resp 16   Ht '5\' 7"'$  (1.702 m)   Wt 80 kg   SpO2 94%   BMI 27.62 kg/m  Alert , oriented x 4 Perrl, Moving all extremities Waiting on placement Exam is stable

## 2021-12-04 ENCOUNTER — Inpatient Hospital Stay (HOSPITAL_COMMUNITY): Payer: Medicare Other

## 2021-12-04 NOTE — TOC Progression Note (Signed)
Transition of Care [facility]) - Progression Note    Patient Details  Name: Jean Bowen MRN: [id number] Date of Birth: 07/25/1945  Transition of Care [facility]) CM/SW [place], [place] Phone Number: 12/04/2021, 12:15 PM  Clinical Narrative:     CSW left voice message for step-daughter, requested call back  CSW spoke with patient's close friend,Mimi, who is also assisting in making decision for patient- informed per chat review, patient is medically stable for discharge, waiting placement-  CSW restated patient has been medically stable, bed offers has been provided, family/friend has been reluctant to select SNF that is offering placement, family/friend wants [facility], which has confirmed 2x they are unable to offer and patient may be at risk of insurance no longer covering hospital stay- Mimi, states that (cost )would not be a problem for the patient. Mimi confirmed she is aware and understands information that was discussed. She reports they are still trying to get patient into [facility] as a resident/ or in [facility] program that provides 24/7 care in the home- until the patient is accepted [facility]. CSW inquired if they consider  selecting another SNF for short term rehab while they work on getting the patient into [facility], they will review this option.  Mimi reports, patient's step-daughter is expected to arrive in town today. They will discuss other options and review  SNF choices.  CSW encourage family to contact CSW or Weekend CSW with their decision.   TOC will continue to follow and assist with discharge planning.   Thurmond Butts, MSW, LCSW Clinical Social Worker    Expected Discharge Plan: Skilled Nursing Facility Barriers to Discharge: Continued Medical Work up  Expected Discharge Plan and Services Expected Discharge Plan: [place] In-house Referral: Clinical Social Work     Living arrangements for the past 2 months: Single  Family Home                                       Social Determinants of Health (SDOH) Interventions    Readmission Risk Interventions     No data to display

## 2021-12-04 NOTE — Progress Notes (Signed)
Patient ID: Jean Bowen, female   DOB: 02/16/1946, 76 y.o.   MRN: [id number] Head ct shows expected evolution of temporal contusion. Aphasia is unchanged.  Moving all extremities Waiting on placement Head ct looks good overall.

## 2021-12-04 NOTE — Care Management Important Message (Signed)
Important Message  Patient Details  Name: Jean Bowen MRN: [id number] Date of Birth: 20-Oct-1945   Medicare Important Message Given:  Yes     Hannah Beat 12/04/2021, 11:16 AM

## 2021-12-04 NOTE — Plan of Care (Signed)

## 2021-12-04 NOTE — Progress Notes (Signed)
Physical Therapy Treatment Patient Details Name: Jean Bowen MRN: [id number] DOB: 04/11/1946 Today's Date: 12/04/2021   History of Present Illness Pt is a 76 y.o. female who presented 11/24/21 s/p fall with head injury, unclear as to the situation. Pt sustained an occipital skull fracture, traumatic SAHs involving the bil frontal and temporal lobes as well as the R cerebellum traumatic, left temporal contusion, left frontal contusion, right cerebellar contusion. PMH: angioedema, HTN, OSA, osteoporosis, hypercholesterolemia, GERD, diverticulosis, depression    PT Comments    Patient with slow progress ambulating a little further distance prior to reporting fatigue and returned to room.  Deconditioning as expected due to limited mobility with hospitalization and pt's poor initiation for mobility.  She was seemingly dealing with more aphasia today trying to ask questions and unable to get right words out and pausing when she realized it came out wrong, and rather confused to situation and place.  She denies headache, but RN made aware of differences.  PT will continue to follow acutely.    Recommendations for follow up therapy are one component of a multi-disciplinary discharge planning process, led by the attending physician.  Recommendations may be updated based on patient status, additional functional criteria and insurance authorization.  Follow Up Recommendations  Skilled nursing-short term rehab (<3 hours/day) Can patient physically be transported by private vehicle: Yes   Assistance Recommended at Discharge Frequent or constant Supervision/Assistance  Patient can return home with the following Assistance with cooking/housework;Direct supervision/assist for medications management;Direct supervision/assist for financial management;Assist for transportation;Help with stairs or ramp for entrance;A little help with walking and/or transfers;A little help with bathing/dressing/bathroom   Equipment  Recommendations  Rolling walker (2 wheels);BSC/3in1    Recommendations for Other Services       Precautions / Restrictions Precautions Precautions: Fall Precaution Comments: SBP goal 130-150; watch SpO2     Mobility  Bed Mobility Overal bed mobility: Needs Assistance Bed Mobility: Supine to Sit     Supine to sit: HOB elevated, Min guard     General bed mobility comments: greatly increased time coming to EOB, but not much help needed    Transfers Overall transfer level: Needs assistance Equipment used: Rolling walker (2 wheels) Transfers: Sit to/from Stand Sit to Stand: Min assist           General transfer comment: up from EOB with assist for balance and walker safety    Ambulation/Gait Ambulation/Gait assistance: Min assist Gait Distance (Feet): 50 Feet Assistive device: Rolling walker (2 wheels) Gait Pattern/deviations: Step-to pattern, Step-through pattern, Decreased stride length, Shuffle       General Gait Details: very slow and hesitant. assist for balance,  reports feeling like passing out in hallway so returned to room, but VSS.   Stairs             Wheelchair Mobility    Modified Rankin (Stroke Patients Only)       Balance Overall balance assessment: Needs assistance Sitting-balance support: Feet supported Sitting balance-Leahy Scale: Good     Standing balance support: Bilateral upper extremity supported, Reliant on assistive device for balance Standing balance-Leahy Scale: Poor Standing balance comment: UE support for balance                            Cognition Arousal/Alertness: Awake/alert Behavior During Therapy: WFL for tasks assessed/performed Overall Cognitive Status: Impaired/Different from baseline Area of Impairment: Orientation, Attention, Memory, Following commands  Rancho Levels of Cognitive Functioning Rancho [place] Amigos Scales of Cognitive Functioning: Confused,  Appropriate Orientation Level: Disoriented to, Place, Time, Situation Current Attention Level: Sustained Memory: Decreased short-term memory Following Commands: Follows one step commands with increased time, Follows one step commands consistently Safety/Judgement: Decreased awareness of deficits, Decreased awareness of safety   Problem Solving: Slow processing, Decreased initiation, Difficulty sequencing, Requires verbal cues     Rancho [employer] Scales of Cognitive Functioning: Confused, Appropriate    Exercises      General Comments General comments (skin integrity, edema, etc.): on RA for ambulation      Pertinent Vitals/Pain Pain Assessment Faces Pain Scale: Hurts little more Pain Location: back Pain Descriptors / Indicators: Aching Pain Intervention(s): Monitored during session, Repositioned    Home Living                          Prior Function            PT Goals (current goals can now be found in the care plan section) Progress towards PT goals: Progressing toward goals    Frequency    Min 3X/week      PT Plan Current plan remains appropriate    Co-evaluation              AM-PAC PT "6 Clicks" Mobility   Outcome Measure  Help needed turning from your back to your side while in a flat bed without using bedrails?: A Little Help needed moving from lying on your back to sitting on the side of a flat bed without using bedrails?: A Little Help needed moving to and from a bed to a chair (including a wheelchair)?: A Little Help needed standing up from a chair using your arms (e.g., wheelchair or bedside chair)?: A Little Help needed to walk in hospital room?: A Little Help needed climbing 3-5 steps with a railing? : Total 6 Click Score: 16    End of Session Equipment Utilized During Treatment: Gait belt Activity Tolerance: Patient limited by fatigue Patient left: in chair;with call bell/phone within reach Nurse Communication: Other  (comment) (concern for aphasia) PT Visit Diagnosis: Other abnormalities of gait and mobility (R26.89);Muscle weakness (generalized) (M62.81)     Time: 8185-6314 PT Time Calculation (min) (ACUTE ONLY): 27 min  Charges:  $Gait Training: 8-22 mins $Therapeutic Activity: 8-22 mins                     Magda Kiel, PT Acute Rehabilitation Services Office:[phone] 12/04/2021    Jean Bowen 12/04/2021, 1:32 PM

## 2021-12-04 NOTE — Progress Notes (Signed)
Occupational Therapy Treatment Patient Details Name: Jean Bowen MRN: [id number] DOB: 30-Jul-1945 Today's Date: 12/04/2021   History of present illness Pt is a 76 y.o. female who presented 11/24/21 s/p fall with head injury, unclear as to the situation. Pt sustained an occipital skull fracture, traumatic SAHs involving the bil frontal and temporal lobes as well as the R cerebellum traumatic, left temporal contusion, left frontal contusion, right cerebellar contusion. PMH: angioedema, HTN, OSA, osteoporosis, hypercholesterolemia, GERD, diverticulosis, depression   OT comments  Pt unable to progress towards acute OT goals this session. Limited by lethargy, increased confusion, and expressive difficulties. Pt received sitting in recliner. Pt would open eyes with multimodal cues but other than that eyes were closed. Answering most questions with "I don't know" or "I don't care," some nonsensical speech. Unable to follow one step commands consistently this session. Alerted RN and MD to changes in pt's functional status compared to last 2 sessions with this OT. D/c recommendations remain appropriate.    Recommendations for follow up therapy are one component of a multi-disciplinary discharge planning process, led by the attending physician.  Recommendations may be updated based on patient status, additional functional criteria and insurance authorization.    Follow Up Recommendations  Skilled nursing-short term rehab (<3 hours/day)    Assistance Recommended at Discharge Frequent or constant Supervision/Assistance  Patient can return home with the following  A little help with walking and/or transfers;A little help with bathing/dressing/bathroom;Assistance with cooking/housework;Direct supervision/assist for medications management;Direct supervision/assist for financial management;Assist for transportation;Help with stairs or ramp for entrance   Equipment Recommendations  Other (comment) (defer to next  venue)    Recommendations for Other Services      Precautions / Restrictions Precautions Precautions: Fall Precaution Comments: SBP goal 130-150; watch SpO2 Restrictions Weight Bearing Restrictions: No       Mobility Bed Mobility               General bed mobility comments: up in recliner    Transfers                   General transfer comment: unable to safely attempt d/t lethergy and increased confusion.     Balance       Sitting balance - Comments: up in recliner unable to assess sitting balance d/t lethargy and increased confusion                                   ADL either performed or assessed with clinical judgement   ADL Overall ADL's : Needs assistance/impaired                                       General ADL Comments: Pt with significant change in alertness level, expressive difficulties and decreased cognition from prior OT session 2 days ago. Unable to engage in chair level grooming task.    Extremity/Trunk Assessment Upper Extremity Assessment Upper Extremity Assessment: Generalized weakness   Lower Extremity Assessment Lower Extremity Assessment: Defer to PT evaluation        Vision       Perception     Praxis      Cognition Arousal/Alertness: Lethargic Behavior During Therapy: Flat affect, Restless Overall Cognitive Status: Difficult to assess Area of Impairment: Orientation, Attention, Memory, Following commands  Orientation Level: Disoriented to, Place, Time, Situation, Person Current Attention Level: Focused Memory: Decreased short-term memory Following Commands: Follows one step commands inconsistently, Follows one step commands with increased time Safety/Judgement: Decreased awareness of deficits, Decreased awareness of safety Awareness: Intellectual Problem Solving: Slow processing, Decreased initiation, Difficulty sequencing, Requires verbal cues, Requires  tactile cues General Comments: Seen up in chair. Pt lethergic and with expressive difficulties. Often answering basic questions(name? pain?)  with "I don't know." Nonsensical speech at times. Able to open eyes with multimodal cueing but quickly closes them. Moaning and a bit restless at times. Not following one step commands consistently.        Exercises      Shoulder Instructions       General Comments on RA for ambulation    Pertinent Vitals/ Pain       Pain Assessment Pain Assessment: Faces Faces Pain Scale: Hurts little more Pain Location: grimacing, unable to state if she's in pain. "I don't know" Pain Descriptors / Indicators: Grimacing Pain Intervention(s): Monitored during session, Limited activity within patient's tolerance  Home Living                                          Prior Functioning/Environment              Frequency  Min 2X/week        Progress Toward Goals  OT Goals(current goals can now be found in the care plan section)  Progress towards OT goals: Not progressing toward goals - comment (limited by lethargy, increased confusion, expressive difficulties. RN and MD made aware of OT's observations.)  Acute Rehab OT Goals Patient Stated Goal: unable to state OT Goal Formulation: Patient unable to participate in goal setting Time For Goal Achievement: 12/10/21 Potential to Achieve Goals: Good ADL Goals Pt Will Perform Lower Body Bathing: with supervision;with set-up;sit to/from stand Pt Will Perform Lower Body Dressing: with supervision;with set-up;sit to/from stand Pt Will Transfer to Toilet: with supervision;ambulating Pt Will Perform Toileting - Clothing Manipulation and hygiene: with supervision Additional ADL Goal #1: Demonstrate selective attention during AADL task in iminamlly distracting environment Additional ADL Goal #2: follow 2 step commands 75% of time in minimally distractin environment  Plan Discharge plan  remains appropriate    Co-evaluation                 AM-PAC OT "6 Clicks" Daily Activity     Outcome Measure   Help from another person eating meals?: A Lot Help from another person taking care of personal grooming?: A Lot Help from another person toileting, which includes using toliet, bedpan, or urinal?: A Lot Help from another person bathing (including washing, rinsing, drying)?: A Lot Help from another person to put on and taking off regular upper body clothing?: A Lot Help from another person to put on and taking off regular lower body clothing?: A Lot 6 Click Score: 12    End of Session Equipment Utilized During Treatment: Oxygen;Other (comment) (RA upon arrival of OT then placed on 2L [place] once SpO2 noted to be 89)  OT Visit Diagnosis: Unsteadiness on feet (R26.81);Other abnormalities of gait and mobility (R26.89);Muscle weakness (generalized) (M62.81);Other symptoms and signs involving cognitive function;Dizziness and giddiness (R42);Pain   Activity Tolerance Patient tolerated treatment well   Patient Left in chair;with call bell/phone within reach;with chair alarm set   Nurse Communication Other (comment) (  lethary, significantly increased confusion from prior OT session with this therapist, expressive difficulty. Also spoke with Dr. Christella Noa via phone.)        Time: 7564-3329 OT Time Calculation (min): 22 min  Charges: OT General Charges $OT Visit: 1 Visit OT Treatments $Therapeutic Activity: 8-22 mins  Tyrone Schimke, OT Acute Rehabilitation Services Office: [phone]   Hortencia Pilar 12/04/2021, 1:59 PM

## 2021-12-05 MED ORDER — ORAL CARE MOUTH RINSE: 15.0000 mL | OROMUCOSAL | Status: DC | PRN

## 2021-12-05 NOTE — Progress Notes (Signed)
Subjective: Patient reports doing well. No acute events overnight.  Objective: Vital signs in last 24 hours: Temp:  [98.2 F (36.8 C)-99.9 F (37.7 C)] 98.8 F (37.1 C) (08/19 0952) Pulse Rate:  [62-80] 67 (08/19 0952) Resp:  [14-20] 16 (08/19 0952) BP: (115-151)/(64-91) 115/64 (08/19 0952) SpO2:  [86 %-97 %] 91 % (08/19 0952)  Intake/Output from previous day: 08/18 0701 - 08/19 0700 In: 500 [P.O.:500] Out: 450 [Urine:450] Intake/Output this shift: No intake/output data recorded.  Physical Exam: Patient is awake and alert. Oriented to self only. Eyes open spontaneously. They are in NAD and VSS. Doing well. Speech is aphasic. Following commands. MAEW. Sensation to light touch is intact. PERLA, EOMI. CNs grossly intact.    Lab Results: No results for input(s): "WBC", "HGB", "HCT", "PLT" in the last 72 hours. BMET No results for input(s): "NA", "K", "CL", "CO2", "GLUCOSE", "BUN", "CREATININE", "CALCIUM" in the last 72 hours.  Studies/Results: CT HEAD WO CONTRAST (5MM)  Result Date: 12/04/2021 CLINICAL DATA:  Fall prior hemorrhage seen on 11/26/2021 EXAM: CT HEAD WITHOUT CONTRAST TECHNIQUE: Contiguous axial images were obtained from the base of the skull through the vertex without intravenous contrast. RADIATION DOSE REDUCTION: This exam was performed according to the departmental dose-optimization program which includes automated exposure control, adjustment of the mA and/or kV according to patient size and/or use of iterative reconstruction technique. COMPARISON:  CT head 11/26/2021 FINDINGS: Brain: Previously seen subdural blood overlying the left cerebral hemisphere measuring up to 4 mm in thickness overlying the frontal lobe is minimally increased in size compared to the study from 11/26/2021. The density of the collection is overall decreased particularly overlying the anterior frontal lobe consistent with expected evolution of blood products; however, there is a small new area of  hyperdensity within the hematoma suspicious for interval/acute bleed (5-41). The extra-axial blood products exert mild mass effect on the underlying brain parenchyma with trace rightward midline shift similar to the prior study. Intraparenchymal blood in the left frontal and temporal lobes and right cerebellar hemisphere also demonstrates expected evolution with mild surrounding edema/developing gliosis but no mass effect. Subarachnoid hemorrhage overlying the left frontal lobe is decreased in conspicuity. Extra-axial blood overlying the right frontal lobe is decreased in conspicuity. The ventricles are stable in size. Patchy hypodensity in the supratentorial white matter likely reflecting chronic white matter microangiopathy is unchanged. Vascular: There is calcification of the bilateral cavernous ICAs. Skull: The right occipital fracture extending to the skull base is unchanged. Sinuses/Orbits: There is opacification of the sphenoid sinuses, unchanged. Partial opacification of the right mastoid air cells and middle ear cavity is also unchanged. Bilateral lens implants are in place. The globes and orbits are otherwise unremarkable. Other: None. IMPRESSION: Since 11/26/2021: 1. Multicompartmental hemorrhage including left subdural and subarachnoid hemorrhage and intraparenchymal hemorrhage in the left frontal and temporal lobes and right cerebellar hemisphere is overall revolving expectantly; however, there is a new focus of hyperdensity within the subdural blood overlying the left cerebral convexity suspicious for small volume new acute blood with minimal increased thickness of the hematoma. 2. The blood products exert mild mass effect on the underlying brain parenchyma resulting in trace midline shift. 3. Unchanged right occipital bone fracture extending to the skull base. Unchanged partial opacification of the right mastoid air cells and middle ear cavity without definite temporal bone fracture identified.  Nonemergent temporal bone CT may be considered if there is clinical concern for temporal bone fracture. These results were called by telephone at the time of  interpretation on 12/04/2021 at 7:15 pm to provider Dr Glenford Peers, who verbally acknowledged these results. Electronically Signed   By: Valetta Mole M.D.   On: 12/04/2021 19:16    Assessment/Plan: Patient is doing well. Neuro exam is stable and continues to be expressively aphasic. Awaiting SNF placement. Continue PT/OT.    LOS: 10 days     Marvis Moeller, DNP, AGNP-C Neurosurgery Nurse Practitioner  [facility] [place] [address], [address], [place], [place] [address] P: [phone]    F: [phone]  12/05/2021 11:59 AM

## 2021-12-05 NOTE — Plan of Care (Signed)

## 2021-12-06 NOTE — Progress Notes (Signed)
Subjective: Patient reports "feeling better" this morning. She is resting comfortably in bed in NAD. NAE ON.  Objective: Vital signs in last 24 hours: Temp:  [97.4 F (36.3 C)-99.2 F (37.3 C)] 98.9 F (37.2 C) (08/20 0354) Pulse Rate:  [60-78] 67 (08/20 0354) Resp:  [14-18] 18 (08/20 0354) BP: (96-148)/(64-95) 123/95 (08/20 0354) SpO2:  [90 %-95 %] 90 % (08/20 0354)  Intake/Output from previous day: 08/19 0701 - 08/20 0700 In: -  Out: 150 [Urine:150] Intake/Output this shift: No intake/output data recorded.  Physical Exam: Patient is asleep but arouses easily to voice and is able to maintain her alertness. Appropriately interactive. Oriented to self only. They are in NAD and VSS. Doing well. Expressive aphasia persists. Following commands. MAEW. Sensation to light touch is intact. PERLA, EOMI. CNs grossly intact.     Lab Results: No results for input(s): "WBC", "HGB", "HCT", "PLT" in the last 72 hours. BMET No results for input(s): "NA", "K", "CL", "CO2", "GLUCOSE", "BUN", "CREATININE", "CALCIUM" in the last 72 hours.  Studies/Results: CT HEAD WO CONTRAST (5MM)  Result Date: 12/04/2021 CLINICAL DATA:  Fall prior hemorrhage seen on 11/26/2021 EXAM: CT HEAD WITHOUT CONTRAST TECHNIQUE: Contiguous axial images were obtained from the base of the skull through the vertex without intravenous contrast. RADIATION DOSE REDUCTION: This exam was performed according to the departmental dose-optimization program which includes automated exposure control, adjustment of the mA and/or kV according to patient size and/or use of iterative reconstruction technique. COMPARISON:  CT head 11/26/2021 FINDINGS: Brain: Previously seen subdural blood overlying the left cerebral hemisphere measuring up to 4 mm in thickness overlying the frontal lobe is minimally increased in size compared to the study from 11/26/2021. The density of the collection is overall decreased particularly overlying the anterior frontal  lobe consistent with expected evolution of blood products; however, there is a small new area of hyperdensity within the hematoma suspicious for interval/acute bleed (5-41). The extra-axial blood products exert mild mass effect on the underlying brain parenchyma with trace rightward midline shift similar to the prior study. Intraparenchymal blood in the left frontal and temporal lobes and right cerebellar hemisphere also demonstrates expected evolution with mild surrounding edema/developing gliosis but no mass effect. Subarachnoid hemorrhage overlying the left frontal lobe is decreased in conspicuity. Extra-axial blood overlying the right frontal lobe is decreased in conspicuity. The ventricles are stable in size. Patchy hypodensity in the supratentorial white matter likely reflecting chronic white matter microangiopathy is unchanged. Vascular: There is calcification of the bilateral cavernous ICAs. Skull: The right occipital fracture extending to the skull base is unchanged. Sinuses/Orbits: There is opacification of the sphenoid sinuses, unchanged. Partial opacification of the right mastoid air cells and middle ear cavity is also unchanged. Bilateral lens implants are in place. The globes and orbits are otherwise unremarkable. Other: None. IMPRESSION: Since 11/26/2021: 1. Multicompartmental hemorrhage including left subdural and subarachnoid hemorrhage and intraparenchymal hemorrhage in the left frontal and temporal lobes and right cerebellar hemisphere is overall revolving expectantly; however, there is a new focus of hyperdensity within the subdural blood overlying the left cerebral convexity suspicious for small volume new acute blood with minimal increased thickness of the hematoma. 2. The blood products exert mild mass effect on the underlying brain parenchyma resulting in trace midline shift. 3. Unchanged right occipital bone fracture extending to the skull base. Unchanged partial opacification of the right  mastoid air cells and middle ear cavity without definite temporal bone fracture identified. Nonemergent temporal bone CT may be considered if  there is clinical concern for temporal bone fracture. These results were called by telephone at the time of interpretation on 12/04/2021 at 7:15 pm to provider Dr Glenford Peers, who verbally acknowledged these results. Electronically Signed   By: Valetta Mole M.D.   On: 12/04/2021 19:16    Assessment/Plan: Patient is continuing to do well. Neuro exam is stable. Expressive aphasia persists. Updated patient's daughter-in-law yesterday via telephone. Daughter-in-law in town to help with SNF placement. Awaiting SNF placement. Continue PT/OT.    LOS: 11 days     Marvis Moeller, DNP, AGNP-C Neurosurgery Nurse Practitioner  [facility] [place] [address], [address], [place], [place] [address] P: [phone]    F: [phone]  12/06/2021 8:27 AM

## 2021-12-07 NOTE — Discharge Summary (Signed)
Physician Discharge Summary  Patient ID: Jean Bowen MRN: [id number] DOB/AGE: 22-Mar-1946 76 y.o.  Admit date: 11/24/2021 Discharge date: 12/08/2021  Admission Diagnoses:closed head injury, left temporal contusion  Discharge Diagnoses: closed head injury, left temporal contusion Principal Problem:   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[place]) Active Problems:   Hypothyroidism   HYPERCHOLESTEROLEMIA   Major depressive disorder, single episode, in remission ([place])   Essential hypertension   GERD   Hyperparathyroidism ([place])   Anemia   Hypoxia   Nodule of upper lobe of left lung   Aortic aneurysm, thoracic [facility])   Discharged Condition: good  Hospital Course: Jean Bowen is a 76 y.o. female Whom was admitted after being found down. Had multiple injuries including skull fracture, temporal contusion, and a small acute subdural hematoma on the left. No operative intervention was needed. Has been aphasic during her hospitalization. Oriented to self, expressive aphasia. Moving all extremities well. Perrl, full eom Following commands.   Treatments: observation  Discharge Exam: Blood pressure 98/72, pulse 72, temperature 98.2 F (36.8 C), temperature source Oral, resp. rate 20, height '5\' 7"'$  (1.702 m), weight 80 kg, SpO2 94 %. General appearance: cooperative, appears stated age, and no distress Neurologic: Mental status: Alert, oriented, thought content appropriate, alertness: alert  Disposition: Discharge disposition: 02-Transferred to [facility]      * No surgery found *  Allergies as of 12/07/2021       Reactions   Vibra-tab [doxycycline] Swelling   Swelling of lips and throat   Losartan Potassium Swelling   Angioedema    Morphine And Related Nausea And Vomiting   Caused vomiting when given too fast in IV.        Medication List     TAKE these medications    acetaminophen 325 MG tablet Commonly known as: TYLENOL Take 650 mg by mouth every 6 (six) hours as  needed for mild pain.   acyclovir 400 MG tablet Commonly known as: ZOVIRAX Take 2 tablets (800 mg total) by mouth daily. TAKE TWO TABLETS BY MOUTH EVERY 4 HOURS What changed:  when to take this additional instructions   amLODipine 2.5 MG tablet Commonly known as: NORVASC TAKE ONE TABLET BY MOUTH DAILY   ascorbic acid 500 MG tablet Commonly known as: VITAMIN C Take 500 mg by mouth daily.   cholecalciferol 25 MCG (1000 UNIT) tablet Commonly known as: VITAMIN D3 Take 1,000 Units by mouth daily.   escitalopram 10 MG tablet Commonly known as: LEXAPRO Take 1 tablet (10 mg total) by mouth daily.   hydrOXYzine 10 MG tablet Commonly known as: ATARAX Take 1 tablet (10 mg total) by mouth 3 (three) times daily as needed for itching.   levothyroxine 25 MCG tablet Commonly known as: SYNTHROID Take 1 tablet (25 mcg total) by mouth daily with breakfast.   omeprazole 40 MG capsule Commonly known as: PRILOSEC Take 1 capsule (40 mg total) by mouth daily.   oxybutynin 5 MG tablet Commonly known as: DITROPAN Take 1 tablet (5 mg total) by mouth 3 (three) times daily.        Follow-up Information     Ashok Pall, MD Follow up in 1 month(s).   Specialty: Neurosurgery Contact information: [address] [place] [address] [phone]                 Signed: Ashok Pall 12/07/2021, 8:26 PM

## 2021-12-07 NOTE — Progress Notes (Signed)
Physical Therapy Treatment Patient Details Name: Jean Bowen MRN: [id number] DOB: [birth year] Today's Date: 12/07/2021   History of Present Illness Pt is a 76 y.o. female who presented 11/24/21 s/p fall with head injury, unclear as to the situation. Pt sustained an occipital skull fracture, traumatic SAHs involving the bil frontal and temporal lobes as well as the R cerebellum traumatic, left temporal contusion, left frontal contusion, right cerebellar contusion. PMH: angioedema, HTN, OSA, osteoporosis, hypercholesterolemia, GERD, diverticulosis, depression    PT Comments    Patient remains highly distractible and needing increased time for all mobility.  She is having significant R shoulder pain with any motion and is guarding with risk for developing frozen shoulder.  She may benefit from imaging for checking status.  She remains appropriate for STSNF level rehab at d/c.  PT will continue to follow.  Recommendations for follow up therapy are one component of a multi-disciplinary discharge planning process, led by the attending physician.  Recommendations may be updated based on patient status, additional functional criteria and insurance authorization.  Follow Up Recommendations  Skilled nursing-short term rehab (<3 hours/day) Can patient physically be transported by private vehicle: Yes   Assistance Recommended at Discharge Frequent or constant Supervision/Assistance  Patient can return home with the following Assistance with cooking/housework;Direct supervision/assist for medications management;Direct supervision/assist for financial management;Assist for transportation;Help with stairs or ramp for entrance;A little help with walking and/or transfers;A little help with bathing/dressing/bathroom   Equipment Recommendations  Rolling walker (2 wheels);BSC/3in1    Recommendations for Other Services       Precautions / Restrictions Precautions Precautions: Fall Precaution Comments: SBP  goal 130-150; watch SpO2     Mobility  Bed Mobility Overal bed mobility: Needs Assistance Bed Mobility: Supine to Sit, Sit to Supine     Supine to sit: HOB elevated, Min guard Sit to supine: Min assist   General bed mobility comments: pulls up to sit on therapists hand, very slow to move to EOB as internally distracted; to supine assist for trunk and legs to straighten up since laying diagonal on the bed    Transfers Overall transfer level: Needs assistance Equipment used: Rolling walker (2 wheels) Transfers: Sit to/from Stand Sit to Stand: Min assist           General transfer comment: up from EOB pt using walker; kept complaining about shoulder so cue to keep R hand in her lap and she preferred to pull up on the walker, assist to stabilize and for safety    Ambulation/Gait Ambulation/Gait assistance: Min assist Gait Distance (Feet): 80 Feet Assistive device: Rolling walker (2 wheels) Gait Pattern/deviations: Step-to pattern, Step-through pattern, Decreased stride length       General Gait Details: very slow and pausing as distracted by items on counter, signs on the wall, etc   Stairs             Wheelchair Mobility    Modified Rankin (Stroke Patients Only)       Balance Overall balance assessment: Needs assistance   Sitting balance-Leahy Scale: Good Sitting balance - Comments: leaning down to fix socks sitting up in bed   Standing balance support: Bilateral upper extremity supported, Reliant on assistive device for balance Standing balance-Leahy Scale: Poor                              Cognition Arousal/Alertness: Awake/alert Behavior During Therapy: Flat affect Overall Cognitive Status: Impaired/Different from baseline Area of  Impairment: Orientation, Attention, Memory, Following commands               Rancho Levels of Cognitive Functioning Rancho Los Amigos Scales of Cognitive Functioning: Confused, Appropriate Orientation  Level: Disoriented to, Place, Time, Situation, Person Current Attention Level: Sustained Memory: Decreased short-term memory Following Commands: Follows one step commands with increased time, Follows one step commands consistently Safety/Judgement: Decreased awareness of deficits, Decreased awareness of safety   Problem Solving: Slow processing, Decreased initiation, Difficulty sequencing, Requires verbal cues, Requires tactile cues     Rancho [employer] Scales of Cognitive Functioning: Confused, Appropriate    Exercises Other Exercises Other Exercises: seated table top exercises for R shoulder flexion x 5 not well tolerated and limited ROM    General Comments General comments (skin integrity, edema, etc.): on 2L O2 with SpO2 90%      Pertinent Vitals/Pain Pain Assessment Faces Pain Scale: Hurts even more Pain Location: R shoulder with movement Pain Descriptors / Indicators: Guarding, Grimacing, Moaning Pain Intervention(s): Monitored during session, Repositioned, RN gave pain meds during session    Home Living                          Prior Function            PT Goals (current goals can now be found in the care plan section) Progress towards PT goals: Progressing toward goals    Frequency    Min 3X/week      PT Plan Current plan remains appropriate    Co-evaluation              AM-PAC PT "6 Clicks" Mobility   Outcome Measure  Help needed turning from your back to your side while in a flat bed without using bedrails?: A Little Help needed moving from lying on your back to sitting on the side of a flat bed without using bedrails?: A Little Help needed moving to and from a bed to a chair (including a wheelchair)?: A Little Help needed standing up from a chair using your arms (e.g., wheelchair or bedside chair)?: A Little Help needed to walk in hospital room?: A Little Help needed climbing 3-5 steps with a railing? : Total 6 Click Score: 16     End of Session Equipment Utilized During Treatment: Gait belt Activity Tolerance: Patient limited by fatigue Patient left: in bed;with call bell/phone within reach;with bed alarm set   PT Visit Diagnosis: Other abnormalities of gait and mobility (R26.89);Muscle weakness (generalized) (M62.81)     Time: 6734-1937 PT Time Calculation (min) (ACUTE ONLY): 31 min  Charges:  $Gait Training: 8-22 mins $Therapeutic Activity: 8-22 mins                     Magda Kiel, PT Acute Rehabilitation Services Office:[phone] 12/07/2021    Reginia Naas 12/07/2021, 5:21 PM

## 2021-12-07 NOTE — TOC Progression Note (Addendum)
Transition of Care [facility]) - Progression Note    Patient Details  Name: Jean Bowen MRN: [id number] Date of Birth: [birth year]  Transition of Care [facility]) CM/SW [place], [place] Phone Number: 12/07/2021, 3:45 PM  Clinical Narrative:     Received call from patient's stepdaughter- family has decided on [facility].  [facility] has confirmed bed offer and availability. SNF can admit if remains medically stable tomorrow. If any pains listed on d/c summary will need script.  TOC will continue to follow and assist with discharge planning.  Thurmond Butts, MSW, LCSW Clinical Social Worker    Expected Discharge Plan: Skilled Nursing Facility Barriers to Discharge: Continued Medical Work up  Expected Discharge Plan and Services Expected Discharge Plan: [place] In-house Referral: Clinical Social Work     Living arrangements for the past 2 months: Single Family Home                                       Social Determinants of Health (SDOH) Interventions    Readmission Risk Interventions     No data to display

## 2021-12-08 DIAGNOSIS — Z7401 Bed confinement status: Secondary | ICD-10-CM | POA: Diagnosis not present

## 2021-12-08 DIAGNOSIS — R5381 Other malaise: Secondary | ICD-10-CM | POA: Diagnosis not present

## 2021-12-08 DIAGNOSIS — N3281 Overactive bladder: Secondary | ICD-10-CM | POA: Diagnosis not present

## 2021-12-08 DIAGNOSIS — S6990XA Unspecified injury of unspecified wrist, hand and finger(s), initial encounter: Secondary | ICD-10-CM | POA: Diagnosis not present

## 2021-12-08 DIAGNOSIS — R262 Difficulty in walking, not elsewhere classified: Secondary | ICD-10-CM | POA: Diagnosis not present

## 2021-12-08 DIAGNOSIS — S0636AD Traumatic hemorrhage of cerebrum, unspecified, with loss of consciousness status unknown, subsequent encounter: Secondary | ICD-10-CM | POA: Diagnosis not present

## 2021-12-08 DIAGNOSIS — S065XAA Traumatic subdural hemorrhage with loss of consciousness status unknown, initial encounter: Secondary | ICD-10-CM | POA: Diagnosis not present

## 2021-12-08 DIAGNOSIS — R4182 Altered mental status, unspecified: Secondary | ICD-10-CM | POA: Diagnosis not present

## 2021-12-08 DIAGNOSIS — I712 Thoracic aortic aneurysm, without rupture, unspecified: Secondary | ICD-10-CM | POA: Diagnosis not present

## 2021-12-08 DIAGNOSIS — S0003XA Contusion of scalp, initial encounter: Secondary | ICD-10-CM | POA: Diagnosis not present

## 2021-12-08 DIAGNOSIS — R0902 Hypoxemia: Secondary | ICD-10-CM | POA: Diagnosis not present

## 2021-12-08 DIAGNOSIS — S065XAD Traumatic subdural hemorrhage with loss of consciousness status unknown, subsequent encounter: Secondary | ICD-10-CM | POA: Diagnosis not present

## 2021-12-08 DIAGNOSIS — G9340 Encephalopathy, unspecified: Secondary | ICD-10-CM | POA: Diagnosis not present

## 2021-12-08 DIAGNOSIS — F329 Major depressive disorder, single episode, unspecified: Secondary | ICD-10-CM | POA: Diagnosis not present

## 2021-12-08 DIAGNOSIS — Z09 Encounter for follow-up examination after completed treatment for conditions other than malignant neoplasm: Secondary | ICD-10-CM | POA: Diagnosis not present

## 2021-12-08 DIAGNOSIS — E039 Hypothyroidism, unspecified: Secondary | ICD-10-CM | POA: Diagnosis not present

## 2021-12-08 DIAGNOSIS — R2681 Unsteadiness on feet: Secondary | ICD-10-CM | POA: Diagnosis not present

## 2021-12-08 DIAGNOSIS — E213 Hyperparathyroidism, unspecified: Secondary | ICD-10-CM | POA: Diagnosis not present

## 2021-12-08 DIAGNOSIS — S0636AA Traumatic hemorrhage of cerebrum, unspecified, with loss of consciousness status unknown, initial encounter: Secondary | ICD-10-CM | POA: Diagnosis not present

## 2021-12-08 DIAGNOSIS — M6281 Muscle weakness (generalized): Secondary | ICD-10-CM | POA: Diagnosis not present

## 2021-12-08 DIAGNOSIS — J9601 Acute respiratory failure with hypoxia: Secondary | ICD-10-CM | POA: Diagnosis not present

## 2021-12-08 DIAGNOSIS — R911 Solitary pulmonary nodule: Secondary | ICD-10-CM | POA: Diagnosis not present

## 2021-12-08 DIAGNOSIS — R4701 Aphasia: Secondary | ICD-10-CM | POA: Diagnosis not present

## 2021-12-08 DIAGNOSIS — R41841 Cognitive communication deficit: Secondary | ICD-10-CM | POA: Diagnosis not present

## 2021-12-08 DIAGNOSIS — Z87828 Personal history of other (healed) physical injury and trauma: Secondary | ICD-10-CM | POA: Diagnosis not present

## 2021-12-08 DIAGNOSIS — S0291XD Unspecified fracture of skull, subsequent encounter for fracture with routine healing: Secondary | ICD-10-CM | POA: Diagnosis not present

## 2021-12-08 DIAGNOSIS — S069X0D Unspecified intracranial injury without loss of consciousness, subsequent encounter: Secondary | ICD-10-CM | POA: Diagnosis not present

## 2021-12-08 DIAGNOSIS — Z9181 History of falling: Secondary | ICD-10-CM | POA: Diagnosis not present

## 2021-12-08 DIAGNOSIS — S020XXD Fracture of vault of skull, subsequent encounter for fracture with routine healing: Secondary | ICD-10-CM | POA: Diagnosis not present

## 2021-12-08 DIAGNOSIS — I10 Essential (primary) hypertension: Secondary | ICD-10-CM | POA: Diagnosis not present

## 2021-12-08 DIAGNOSIS — Z9989 Dependence on other enabling machines and devices: Secondary | ICD-10-CM | POA: Diagnosis not present

## 2021-12-08 DIAGNOSIS — Z8619 Personal history of other infectious and parasitic diseases: Secondary | ICD-10-CM | POA: Diagnosis not present

## 2021-12-08 DIAGNOSIS — G4733 Obstructive sleep apnea (adult) (pediatric): Secondary | ICD-10-CM | POA: Diagnosis not present

## 2021-12-08 DIAGNOSIS — F32A Depression, unspecified: Secondary | ICD-10-CM | POA: Diagnosis not present

## 2021-12-08 NOTE — Progress Notes (Signed)
Report called to Elberta Fortis at [facility].

## 2021-12-08 NOTE — TOC Transition Note (Signed)
Transition of Care [facility]) - CM/SW Discharge Note   Patient Details  Name: Jean Bowen MRN: [id number] Date of Birth: 06-26-1945  Transition of Care [facility]) CM/SW Contact:  Vinie Sill, LCSW Phone Number: 12/08/2021, 11:14 AM   Clinical Narrative:     Patient will Discharge to: [facility]  Discharge Date: 12/08/2021 Family Notified: step-daughter Transport By: Corey Harold Please call [place] [phone]- when patient is leaving the unit  Per MD patient is ready for discharge. RN, patient, and facility notified of discharge. Discharge Summary sent to facility. RN given number for [phone], Rm [id number]. Ambulance transport requested for patient.   Clinical Social Worker signing off.  Thurmond Butts, MSW, LCSW Clinical Social Worker     Final next level of care: Skilled Nursing Facility Barriers to Discharge: Barriers Resolved   Patient Goals and CMS Choice        Discharge Placement              Patient chooses bed at: [facility] Patient to be transferred to facility by: [place] Name of family member notified: step-daughter Patient and family notified of of transfer: 12/08/21  Discharge Plan and Services In-house Referral: Clinical Social Work                                   Social Determinants of Health (SDOH) Interventions     Readmission Risk Interventions     No data to display

## 2021-12-08 NOTE — Care Management Important Message (Signed)
Important Message  Patient Details  Name: Jean Bowen MRN: [id number] Date of Birth: February 28, 1946   Medicare Important Message Given:  Yes     Hannah Beat 12/08/2021, 11:35 AM

## 2021-12-09 DIAGNOSIS — M6281 Muscle weakness (generalized): Secondary | ICD-10-CM | POA: Diagnosis not present

## 2021-12-09 DIAGNOSIS — Z9181 History of falling: Secondary | ICD-10-CM | POA: Diagnosis not present

## 2021-12-09 DIAGNOSIS — R4701 Aphasia: Secondary | ICD-10-CM | POA: Diagnosis not present

## 2021-12-09 DIAGNOSIS — S0636AA Traumatic hemorrhage of cerebrum, unspecified, with loss of consciousness status unknown, initial encounter: Secondary | ICD-10-CM | POA: Diagnosis not present

## 2021-12-09 DIAGNOSIS — S020XXD Fracture of vault of skull, subsequent encounter for fracture with routine healing: Secondary | ICD-10-CM | POA: Diagnosis not present

## 2021-12-09 DIAGNOSIS — R5381 Other malaise: Secondary | ICD-10-CM | POA: Diagnosis not present

## 2021-12-09 DIAGNOSIS — R262 Difficulty in walking, not elsewhere classified: Secondary | ICD-10-CM | POA: Diagnosis not present

## 2021-12-09 DIAGNOSIS — S0636AD Traumatic hemorrhage of cerebrum, unspecified, with loss of consciousness status unknown, subsequent encounter: Secondary | ICD-10-CM | POA: Diagnosis not present

## 2021-12-10 ENCOUNTER — Other Ambulatory Visit: Payer: Self-pay | Admitting: *Deleted

## 2021-12-10 DIAGNOSIS — S069X0D Unspecified intracranial injury without loss of consciousness, subsequent encounter: Secondary | ICD-10-CM | POA: Diagnosis not present

## 2021-12-10 DIAGNOSIS — R4701 Aphasia: Secondary | ICD-10-CM | POA: Diagnosis not present

## 2021-12-10 DIAGNOSIS — S065XAA Traumatic subdural hemorrhage with loss of consciousness status unknown, initial encounter: Secondary | ICD-10-CM | POA: Diagnosis not present

## 2021-12-10 DIAGNOSIS — G9340 Encephalopathy, unspecified: Secondary | ICD-10-CM | POA: Diagnosis not present

## 2021-12-10 DIAGNOSIS — Z8619 Personal history of other infectious and parasitic diseases: Secondary | ICD-10-CM | POA: Diagnosis not present

## 2021-12-10 NOTE — Patient Outreach (Addendum)
Mrs. Colgate resides in [facility]. Screening for potential [facility] care coordination/care management needs as a benefit for insurance plan and PCP.  Admitted to [facility] on 12/08/21.   Facility site visit to [facility]. Met with Amaryllis Dyke, SNF social workers, and Verdis Frederickson, Civil engineer, contracting. Mrs. Paternostro is from home alone. Recently fell at home. Surgery not indicated. Mrs. Engdahl was independent prior to admission. Plans to return home post SNF. Care plan meeting scheduled for tomorrow.   Went to bedside to speak with Mrs. Geerdes. However, she was sleeping soundly. Left [facility] Care Management brochure, 24-hr nurse advice line magnet, and writer's contact information on counter in room.   Will continue to follow.    Marthenia Rolling, MSN, RN,BSN [place] Acute Care Coordinator [phone] [facility]) [phone]  (Toll free office)

## 2021-12-11 DIAGNOSIS — R5381 Other malaise: Secondary | ICD-10-CM | POA: Diagnosis not present

## 2021-12-11 DIAGNOSIS — S020XXD Fracture of vault of skull, subsequent encounter for fracture with routine healing: Secondary | ICD-10-CM | POA: Diagnosis not present

## 2021-12-11 DIAGNOSIS — S0636AD Traumatic hemorrhage of cerebrum, unspecified, with loss of consciousness status unknown, subsequent encounter: Secondary | ICD-10-CM | POA: Diagnosis not present

## 2021-12-11 DIAGNOSIS — S0636AA Traumatic hemorrhage of cerebrum, unspecified, with loss of consciousness status unknown, initial encounter: Secondary | ICD-10-CM | POA: Diagnosis not present

## 2021-12-11 DIAGNOSIS — R4701 Aphasia: Secondary | ICD-10-CM | POA: Diagnosis not present

## 2021-12-11 DIAGNOSIS — M6281 Muscle weakness (generalized): Secondary | ICD-10-CM | POA: Diagnosis not present

## 2021-12-11 DIAGNOSIS — R262 Difficulty in walking, not elsewhere classified: Secondary | ICD-10-CM | POA: Diagnosis not present

## 2021-12-11 DIAGNOSIS — Z9181 History of falling: Secondary | ICD-10-CM | POA: Diagnosis not present

## 2021-12-14 DIAGNOSIS — M6281 Muscle weakness (generalized): Secondary | ICD-10-CM | POA: Diagnosis not present

## 2021-12-14 DIAGNOSIS — S020XXD Fracture of vault of skull, subsequent encounter for fracture with routine healing: Secondary | ICD-10-CM | POA: Diagnosis not present

## 2021-12-14 DIAGNOSIS — R5381 Other malaise: Secondary | ICD-10-CM | POA: Diagnosis not present

## 2021-12-14 DIAGNOSIS — R4701 Aphasia: Secondary | ICD-10-CM | POA: Diagnosis not present

## 2021-12-14 DIAGNOSIS — Z9181 History of falling: Secondary | ICD-10-CM | POA: Diagnosis not present

## 2021-12-14 DIAGNOSIS — S0636AD Traumatic hemorrhage of cerebrum, unspecified, with loss of consciousness status unknown, subsequent encounter: Secondary | ICD-10-CM | POA: Diagnosis not present

## 2021-12-14 DIAGNOSIS — S0636AA Traumatic hemorrhage of cerebrum, unspecified, with loss of consciousness status unknown, initial encounter: Secondary | ICD-10-CM | POA: Diagnosis not present

## 2021-12-14 DIAGNOSIS — R262 Difficulty in walking, not elsewhere classified: Secondary | ICD-10-CM | POA: Diagnosis not present

## 2021-12-17 ENCOUNTER — Other Ambulatory Visit: Payer: Self-pay | Admitting: *Deleted

## 2021-12-17 DIAGNOSIS — M6281 Muscle weakness (generalized): Secondary | ICD-10-CM | POA: Diagnosis not present

## 2021-12-17 DIAGNOSIS — R262 Difficulty in walking, not elsewhere classified: Secondary | ICD-10-CM | POA: Diagnosis not present

## 2021-12-17 DIAGNOSIS — Z9181 History of falling: Secondary | ICD-10-CM | POA: Diagnosis not present

## 2021-12-17 DIAGNOSIS — R5381 Other malaise: Secondary | ICD-10-CM | POA: Diagnosis not present

## 2021-12-17 DIAGNOSIS — S0636AA Traumatic hemorrhage of cerebrum, unspecified, with loss of consciousness status unknown, initial encounter: Secondary | ICD-10-CM | POA: Diagnosis not present

## 2021-12-17 DIAGNOSIS — S020XXD Fracture of vault of skull, subsequent encounter for fracture with routine healing: Secondary | ICD-10-CM | POA: Diagnosis not present

## 2021-12-17 DIAGNOSIS — R4701 Aphasia: Secondary | ICD-10-CM | POA: Diagnosis not present

## 2021-12-17 DIAGNOSIS — S0636AD Traumatic hemorrhage of cerebrum, unspecified, with loss of consciousness status unknown, subsequent encounter: Secondary | ICD-10-CM | POA: Diagnosis not present

## 2021-12-17 NOTE — Patient Outreach (Signed)
[place] Coordinator follow up.   Facility site visit to [facility]. Met with Irine Seal, SNF SW. Mrs. Jelinek's family wants her be as close to baseline as possible. Lived alone independently. Progressing with therapy both physically and cognitively.   Will continue to follow while resides in SNF.   Marthenia Rolling, MSN, RN,BSN [place] Acute Care Coordinator [phone] [facility]) [phone]  (Toll free office)

## 2021-12-22 DIAGNOSIS — S020XXD Fracture of vault of skull, subsequent encounter for fracture with routine healing: Secondary | ICD-10-CM | POA: Diagnosis not present

## 2021-12-22 DIAGNOSIS — M6281 Muscle weakness (generalized): Secondary | ICD-10-CM | POA: Diagnosis not present

## 2021-12-22 DIAGNOSIS — R4701 Aphasia: Secondary | ICD-10-CM | POA: Diagnosis not present

## 2021-12-22 DIAGNOSIS — Z9181 History of falling: Secondary | ICD-10-CM | POA: Diagnosis not present

## 2021-12-22 DIAGNOSIS — S0636AA Traumatic hemorrhage of cerebrum, unspecified, with loss of consciousness status unknown, initial encounter: Secondary | ICD-10-CM | POA: Diagnosis not present

## 2021-12-22 DIAGNOSIS — R262 Difficulty in walking, not elsewhere classified: Secondary | ICD-10-CM | POA: Diagnosis not present

## 2021-12-22 DIAGNOSIS — S0636AD Traumatic hemorrhage of cerebrum, unspecified, with loss of consciousness status unknown, subsequent encounter: Secondary | ICD-10-CM | POA: Diagnosis not present

## 2021-12-22 DIAGNOSIS — R5381 Other malaise: Secondary | ICD-10-CM | POA: Diagnosis not present

## 2021-12-24 ENCOUNTER — Other Ambulatory Visit: Payer: Self-pay | Admitting: *Deleted

## 2021-12-24 DIAGNOSIS — R4701 Aphasia: Secondary | ICD-10-CM | POA: Diagnosis not present

## 2021-12-24 DIAGNOSIS — R5381 Other malaise: Secondary | ICD-10-CM | POA: Diagnosis not present

## 2021-12-24 DIAGNOSIS — M6281 Muscle weakness (generalized): Secondary | ICD-10-CM | POA: Diagnosis not present

## 2021-12-24 DIAGNOSIS — S020XXD Fracture of vault of skull, subsequent encounter for fracture with routine healing: Secondary | ICD-10-CM | POA: Diagnosis not present

## 2021-12-24 DIAGNOSIS — S0636AD Traumatic hemorrhage of cerebrum, unspecified, with loss of consciousness status unknown, subsequent encounter: Secondary | ICD-10-CM | POA: Diagnosis not present

## 2021-12-24 DIAGNOSIS — Z9181 History of falling: Secondary | ICD-10-CM | POA: Diagnosis not present

## 2021-12-24 DIAGNOSIS — S0636AA Traumatic hemorrhage of cerebrum, unspecified, with loss of consciousness status unknown, initial encounter: Secondary | ICD-10-CM | POA: Diagnosis not present

## 2021-12-24 DIAGNOSIS — R262 Difficulty in walking, not elsewhere classified: Secondary | ICD-10-CM | POA: Diagnosis not present

## 2021-12-24 NOTE — Patient Outreach (Signed)
[facility] Post- Acute Care Coordinator follow up. Jean Bowen resides in [facility].  Facility site visit to [facility]. Met with therapy manager and SNF social workers. Jean Bowen is progressing well with therapy. Goal to return home. Lives alone. Wearing oxygen. Uncertain of oxygen weaning attempts.  Met with Jean Bowen at bedside. Explained [facility] care coordination services. Provided [facility] Care Management literature and writer's contact information. Jean Bowen states she plans to return home upon SNF discharge. Lives alone but states her stepdaughter will stay with her for a little while post SNF discharge.   Will continue to follow.    Marthenia Rolling, MSN, RN,BSN [place] Acute Care Coordinator [phone] [facility]) [phone]  (Toll free office)

## 2021-12-25 DIAGNOSIS — Z9989 Dependence on other enabling machines and devices: Secondary | ICD-10-CM | POA: Diagnosis not present

## 2021-12-25 DIAGNOSIS — R911 Solitary pulmonary nodule: Secondary | ICD-10-CM | POA: Diagnosis not present

## 2021-12-25 DIAGNOSIS — R4701 Aphasia: Secondary | ICD-10-CM | POA: Diagnosis not present

## 2021-12-25 DIAGNOSIS — R0902 Hypoxemia: Secondary | ICD-10-CM | POA: Diagnosis not present

## 2021-12-25 DIAGNOSIS — S069X0D Unspecified intracranial injury without loss of consciousness, subsequent encounter: Secondary | ICD-10-CM | POA: Diagnosis not present

## 2021-12-25 DIAGNOSIS — G4733 Obstructive sleep apnea (adult) (pediatric): Secondary | ICD-10-CM | POA: Diagnosis not present

## 2021-12-28 DIAGNOSIS — R4701 Aphasia: Secondary | ICD-10-CM | POA: Diagnosis not present

## 2021-12-28 DIAGNOSIS — S0636AD Traumatic hemorrhage of cerebrum, unspecified, with loss of consciousness status unknown, subsequent encounter: Secondary | ICD-10-CM | POA: Diagnosis not present

## 2021-12-28 DIAGNOSIS — S020XXD Fracture of vault of skull, subsequent encounter for fracture with routine healing: Secondary | ICD-10-CM | POA: Diagnosis not present

## 2021-12-28 DIAGNOSIS — M6281 Muscle weakness (generalized): Secondary | ICD-10-CM | POA: Diagnosis not present

## 2021-12-28 DIAGNOSIS — Z9181 History of falling: Secondary | ICD-10-CM | POA: Diagnosis not present

## 2021-12-28 DIAGNOSIS — S065XAA Traumatic subdural hemorrhage with loss of consciousness status unknown, initial encounter: Secondary | ICD-10-CM | POA: Diagnosis not present

## 2021-12-28 DIAGNOSIS — S0636AA Traumatic hemorrhage of cerebrum, unspecified, with loss of consciousness status unknown, initial encounter: Secondary | ICD-10-CM | POA: Diagnosis not present

## 2021-12-28 DIAGNOSIS — R5381 Other malaise: Secondary | ICD-10-CM | POA: Diagnosis not present

## 2021-12-28 DIAGNOSIS — R262 Difficulty in walking, not elsewhere classified: Secondary | ICD-10-CM | POA: Diagnosis not present

## 2021-12-28 DIAGNOSIS — F32A Depression, unspecified: Secondary | ICD-10-CM | POA: Diagnosis not present

## 2021-12-29 DIAGNOSIS — E039 Hypothyroidism, unspecified: Secondary | ICD-10-CM | POA: Diagnosis not present

## 2021-12-29 DIAGNOSIS — E213 Hyperparathyroidism, unspecified: Secondary | ICD-10-CM | POA: Diagnosis not present

## 2021-12-29 DIAGNOSIS — Z87828 Personal history of other (healed) physical injury and trauma: Secondary | ICD-10-CM | POA: Diagnosis not present

## 2021-12-29 DIAGNOSIS — G4733 Obstructive sleep apnea (adult) (pediatric): Secondary | ICD-10-CM | POA: Diagnosis not present

## 2021-12-29 DIAGNOSIS — I10 Essential (primary) hypertension: Secondary | ICD-10-CM | POA: Diagnosis not present

## 2021-12-29 DIAGNOSIS — Z09 Encounter for follow-up examination after completed treatment for conditions other than malignant neoplasm: Secondary | ICD-10-CM | POA: Diagnosis not present

## 2021-12-29 DIAGNOSIS — R911 Solitary pulmonary nodule: Secondary | ICD-10-CM | POA: Diagnosis not present

## 2021-12-29 DIAGNOSIS — R4701 Aphasia: Secondary | ICD-10-CM | POA: Diagnosis not present

## 2021-12-29 DIAGNOSIS — F329 Major depressive disorder, single episode, unspecified: Secondary | ICD-10-CM | POA: Diagnosis not present

## 2021-12-29 DIAGNOSIS — I712 Thoracic aortic aneurysm, without rupture, unspecified: Secondary | ICD-10-CM | POA: Diagnosis not present

## 2021-12-29 DIAGNOSIS — S6990XA Unspecified injury of unspecified wrist, hand and finger(s), initial encounter: Secondary | ICD-10-CM | POA: Diagnosis not present

## 2021-12-30 DIAGNOSIS — S0636AD Traumatic hemorrhage of cerebrum, unspecified, with loss of consciousness status unknown, subsequent encounter: Secondary | ICD-10-CM | POA: Diagnosis not present

## 2021-12-30 DIAGNOSIS — M6281 Muscle weakness (generalized): Secondary | ICD-10-CM | POA: Diagnosis not present

## 2021-12-30 DIAGNOSIS — S020XXD Fracture of vault of skull, subsequent encounter for fracture with routine healing: Secondary | ICD-10-CM | POA: Diagnosis not present

## 2021-12-30 DIAGNOSIS — R5381 Other malaise: Secondary | ICD-10-CM | POA: Diagnosis not present

## 2021-12-30 DIAGNOSIS — R262 Difficulty in walking, not elsewhere classified: Secondary | ICD-10-CM | POA: Diagnosis not present

## 2021-12-30 DIAGNOSIS — R4701 Aphasia: Secondary | ICD-10-CM | POA: Diagnosis not present

## 2021-12-30 DIAGNOSIS — S0636AA Traumatic hemorrhage of cerebrum, unspecified, with loss of consciousness status unknown, initial encounter: Secondary | ICD-10-CM | POA: Diagnosis not present

## 2021-12-30 DIAGNOSIS — Z9181 History of falling: Secondary | ICD-10-CM | POA: Diagnosis not present

## 2022-01-01 ENCOUNTER — Other Ambulatory Visit: Payer: Self-pay | Admitting: *Deleted

## 2022-01-01 NOTE — Patient Outreach (Signed)
[place] Coordinator follow up. Verified in [facility] Mrs. Naramore discharged from [facility].   Previously spoke with Mrs. Varano at [facility] about [facility] care coordination. Telephone call placed to Mrs. Brickman [phone] to see if she would be interested in [facility] services. No answer. HIPAA compliant voicemail message left to request return call.   Marthenia Rolling, MSN, RN,BSN [place] Acute Care Coordinator [phone] (Direct dial)

## 2022-01-05 DIAGNOSIS — E213 Hyperparathyroidism, unspecified: Secondary | ICD-10-CM | POA: Diagnosis not present

## 2022-01-05 DIAGNOSIS — G9340 Encephalopathy, unspecified: Secondary | ICD-10-CM | POA: Diagnosis not present

## 2022-01-05 DIAGNOSIS — G4733 Obstructive sleep apnea (adult) (pediatric): Secondary | ICD-10-CM | POA: Diagnosis not present

## 2022-01-05 DIAGNOSIS — R32 Unspecified urinary incontinence: Secondary | ICD-10-CM | POA: Diagnosis not present

## 2022-01-05 DIAGNOSIS — N3281 Overactive bladder: Secondary | ICD-10-CM | POA: Diagnosis not present

## 2022-01-05 DIAGNOSIS — R911 Solitary pulmonary nodule: Secondary | ICD-10-CM | POA: Diagnosis not present

## 2022-01-05 DIAGNOSIS — E039 Hypothyroidism, unspecified: Secondary | ICD-10-CM | POA: Diagnosis not present

## 2022-01-05 DIAGNOSIS — R4701 Aphasia: Secondary | ICD-10-CM | POA: Diagnosis not present

## 2022-01-05 DIAGNOSIS — K219 Gastro-esophageal reflux disease without esophagitis: Secondary | ICD-10-CM | POA: Diagnosis not present

## 2022-01-05 DIAGNOSIS — Z8619 Personal history of other infectious and parasitic diseases: Secondary | ICD-10-CM | POA: Diagnosis not present

## 2022-01-05 DIAGNOSIS — S065X0D Traumatic subdural hemorrhage without loss of consciousness, subsequent encounter: Secondary | ICD-10-CM | POA: Diagnosis not present

## 2022-01-05 DIAGNOSIS — I712 Thoracic aortic aneurysm, without rupture, unspecified: Secondary | ICD-10-CM | POA: Diagnosis not present

## 2022-01-05 DIAGNOSIS — R41841 Cognitive communication deficit: Secondary | ICD-10-CM | POA: Diagnosis not present

## 2022-01-05 DIAGNOSIS — E7800 Pure hypercholesterolemia, unspecified: Secondary | ICD-10-CM | POA: Diagnosis not present

## 2022-01-05 DIAGNOSIS — K635 Polyp of colon: Secondary | ICD-10-CM | POA: Diagnosis not present

## 2022-01-05 DIAGNOSIS — D649 Anemia, unspecified: Secondary | ICD-10-CM | POA: Diagnosis not present

## 2022-01-05 DIAGNOSIS — F329 Major depressive disorder, single episode, unspecified: Secondary | ICD-10-CM | POA: Diagnosis not present

## 2022-01-05 DIAGNOSIS — M800AXD Age-related osteoporosis with current pathological fracture, other site, subsequent encounter for fracture with routine healing: Secondary | ICD-10-CM | POA: Diagnosis not present

## 2022-01-05 DIAGNOSIS — Z86018 Personal history of other benign neoplasm: Secondary | ICD-10-CM | POA: Diagnosis not present

## 2022-01-05 DIAGNOSIS — Z9181 History of falling: Secondary | ICD-10-CM | POA: Diagnosis not present

## 2022-01-05 DIAGNOSIS — I10 Essential (primary) hypertension: Secondary | ICD-10-CM | POA: Diagnosis not present

## 2022-01-05 DIAGNOSIS — K449 Diaphragmatic hernia without obstruction or gangrene: Secondary | ICD-10-CM | POA: Diagnosis not present

## 2022-01-05 DIAGNOSIS — K5790 Diverticulosis of intestine, part unspecified, without perforation or abscess without bleeding: Secondary | ICD-10-CM | POA: Diagnosis not present

## 2022-01-05 NOTE — Progress Notes (Signed)
Synopsis: First seen by pulmonology when hospitalized in August 2023 after an accidental trip and fall led to a skull fracture and intracerebral hemorrhage.  During that hospitalization she was noted to have mild hypoxemia, chest imaging was worrisome for either underlying ILD versus bronchiectasis.    Subjective:   PATIENT ID: Jean Bowen GENDER: female DOB: Mar 05, 1946, MRN: [id number]   HPI  Chief Complaint  Patient presents with   Hospitalization Follow-up    HFU for hypoxia. States she has been doing well since being home.     Jean Bowen is here to see me as a follow up after her hospitalization for a fall a few weeks ago.   She used to smoke for years, quit in 2003, smoked 2 packs per day. She will wheeze from time to time. She doesn't really get out of breath walking through the grocery store, she can climb stairs.  She can keep up with others her age.  She hasn't had bronchitis or pneumonia. She can eat and swallow without difficulty.   Record review: Hospitalized in August 2023 after skull fracture and intracerebral hemorrhage from an accidental trip and fall.  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   HYPERCHOLESTEROLEMIA 02/23/2010   Hyperplastic colon polyp 12/03/2006   HYPERTENSION 12/03/2006   OSA (obstructive sleep apnea)    OSTEOPOROSIS 12/03/2006   Sleep apnea    URINARY INCONTINENCE 12/03/2006     Family History  Problem Relation Age of Onset   Melanoma Mother    Lung cancer Mother    Colon cancer Father        at advanced age   Arthritis Father    Lung cancer Brother    Heart attack Other        grandfather   Diabetes Other        grandfather/grandmother     Social History   Socioeconomic History    Marital status: Widowed    Spouse name: Jean Bowen    Number of children: Not on file   Years of education: Not on file   Highest education level: Not on file  Occupational History   Occupation: Company secretary and travel  Tobacco Use   Smoking status: Former    Packs/day: 2.00    Years: 30.00    Total pack years: 60.00    Types: Cigarettes    Quit date: 05/22/2001    Years since quitting: 20.6   Smokeless tobacco: Never  Vaping Use   Vaping Use: Never used  Substance and Sexual Activity   Alcohol use: Yes    Alcohol/week: 3.0 standard drinks of alcohol    Types: 3 Glasses of wine per week    Comment: 1-2/week   Drug use: No   Sexual activity: Not on file  Other Topics Concern   Not on file  Social History Narrative   1 dog   Regular exercise-yes   Husband died 2018/09/15      Social Determinants of Health   Financial Resource Strain: Low Risk  (11/21/2020)   Overall Financial Resource Strain (CARDIA)    Difficulty of Paying Living Expenses: Not hard at all  Food Insecurity: No Food Insecurity (11/21/2020)   Hunger Vital Sign  Worried About Charity fundraiser in the Last Year: Never true    [place] in the Last Year: Never true  Transportation Needs: No Transportation Needs (11/21/2020)   PRAPARE - Hydrologist (Medical): No    Lack of Transportation (Non-Medical): No  Physical Activity: Sufficiently Active (11/21/2020)   Exercise Vital Sign    Days of Exercise per Week: 7 days    Minutes of Exercise per Session: 30 min  Stress: No Stress Concern Present (11/21/2020)   [place]    Feeling of Stress : Not at all  Social Connections: Moderately Isolated (11/21/2020)   Social Connection and Isolation Panel [NHANES]    Frequency of Communication with Friends and Family: More than three times a week    Frequency of Social Gatherings with Friends and Family:  More than three times a week    Attends Religious Services: Never    Marine scientist or Organizations: Yes    Attends Archivist Meetings: 1 to 4 times per year    Marital Status: Widowed  Intimate Partner Violence: Not At Risk (11/21/2020)   Humiliation, Afraid, Rape, and Kick questionnaire    Fear of Current or Ex-Partner: No    Emotionally Abused: No    Physically Abused: No    Sexually Abused: No     Allergies  Allergen Reactions   Vibra-Tab [Doxycycline] Swelling    Swelling of lips and throat   Losartan Potassium Swelling    Angioedema    Morphine And Related Nausea And Vomiting    Caused vomiting when given too fast in IV.     Outpatient Medications Prior to Visit  Medication Sig Dispense Refill   acetaminophen (TYLENOL) 325 MG tablet Take 650 mg by mouth every 6 (six) hours as needed for mild pain.     acyclovir (ZOVIRAX) 400 MG tablet Take 2 tablets (800 mg total) by mouth daily. TAKE TWO TABLETS BY MOUTH EVERY 4 HOURS (Patient taking differently: Take 800 mg by mouth every 4 (four) hours.) 90 tablet 0   amLODipine (NORVASC) 2.5 MG tablet TAKE ONE TABLET BY MOUTH DAILY (Patient taking differently: Take 2.5 mg by mouth daily.) 90 tablet 3   ascorbic acid (VITAMIN C) 500 MG tablet Take 500 mg by mouth daily.     cholecalciferol (VITAMIN D3) 25 MCG (1000 UNIT) tablet Take 1,000 Units by mouth daily.     escitalopram (LEXAPRO) 10 MG tablet Take 1 tablet (10 mg total) by mouth daily. 90 tablet 3   hydrOXYzine (ATARAX/VISTARIL) 10 MG tablet Take 1 tablet (10 mg total) by mouth 3 (three) times daily as needed for itching. 90 tablet 1   levothyroxine (SYNTHROID) 25 MCG tablet Take 1 tablet (25 mcg total) by mouth daily with breakfast. 90 tablet 3   omeprazole (PRILOSEC) 40 MG capsule Take 1 capsule (40 mg total) by mouth daily. 30 capsule 11   oxybutynin (DITROPAN) 5 MG tablet Take 1 tablet (5 mg total) by mouth 3 (three) times daily. 270 tablet 3   No  facility-administered medications prior to visit.    Review of Systems  Constitutional:  Negative for chills, fever, malaise/fatigue and weight loss.  HENT:  Negative for congestion, nosebleeds, sinus pain and sore throat.   Eyes:  Negative for photophobia, pain and discharge.  Respiratory:  Negative for cough, hemoptysis, sputum production, shortness of breath and wheezing.   Cardiovascular:  Negative  for chest pain, palpitations, orthopnea and leg swelling.  Gastrointestinal:  Negative for abdominal pain, constipation, diarrhea, nausea and vomiting.  Genitourinary:  Negative for dysuria, frequency, hematuria and urgency.  Musculoskeletal:  Positive for joint pain. Negative for back pain, myalgias and neck pain.  Skin:  Negative for itching and rash.  Neurological:  Negative for tingling, tremors, sensory change, speech change, focal weakness, seizures, weakness and headaches.  Psychiatric/Behavioral:  Negative for memory loss, substance abuse and suicidal ideas. The patient is not nervous/anxious.       Objective:  Physical Exam   Vitals:   01/08/22 1102  BP: 120/76  Pulse: 84  SpO2: 95%  Weight: 165 lb (74.8 kg)  Height: '5\' 7"'$  (1.702 m)    Gen: well appearing, no acute distress HENT: NCAT, OP clear, neck supple without masses Eyes: PERRL, EOMi Lymph: no cervical lymphadenopathy PULM: CTA B CV: RRR, no mgr, no JVD GI: BS+, soft, nontender, no hsm Derm: no rash or skin breakdown MSK: normal bulk and tone Neuro: A&Ox4, CN II-XII intact, strength 5/5 in all 4 extremities Psyche: normal mood and affect   CBC    Component Value Date/Time   WBC 9.6 11/24/2021 2333   RBC 4.76 11/24/2021 2333   HGB 14.6 11/24/2021 2333   HCT 44.5 11/24/2021 2333   PLT 297 11/24/2021 2333   MCV 93.5 11/24/2021 2333   MCH 30.7 11/24/2021 2333   MCHC 32.8 11/24/2021 2333   RDW 13.2 11/24/2021 2333   LYMPHSABS 3.7 11/24/2021 2333   MONOABS 1.0 11/24/2021 2333   EOSABS 0.4 11/24/2021  2333   BASOSABS 0.1 11/24/2021 2333     Chest imaging: August 2023 portable AP chest x-ray images independently reviewed showing mild cardiomegaly, atelectasis in the left lower lobe, otherwise no cardiopulmonary abnormality noted August 2023 CT chest images independently reviewed showing a patulous esophagus, bronchial wall thickening and nonspecific interstitial scarring in the bases, picture most consistent with chronic aspiration.  PFT:  Labs: August 2023 hemoglobin 14.6 g/dL  Path:  Echo:  Heart Catheterization:  Ambulatory oximetry: September 2023 walked 500 feet on room air, O2 saturation range between 91 to 94%     Assessment & Plan:   Hypoxemia  Abnormal CT of the chest  Former very heavy cigarette smoker (more than 40 per day)  Discussion: Frida is here to see [place] today because when she was hospitalized earlier this year after head trauma she was noted to be hypoxemic and had an abnormal CT scan of her chest with bronchial wall thickening as well as nonspecific interstitial changes in the bases of her lungs.  I think there is a high likelihood that she has some degree of occult aspiration given the patulous esophagus seen on the CT scan, however she describes no dysphagia symptoms to go along with this.  The best case scenario and explanation for the hypoxemia is atelectasis related to confusion and encephalopathy from her head trauma at the time.  Hopefully on repeat imaging we will see that this is cleared up and there is not some evidence of underlying chronic lung disease like ILD or bronchiectasis.  Plan: Hypoxemia with abnormal CT scan of the chest: We will check your oxygen level while walking today in the office High-resolution CT scan of the chest Pulmonary function test  Follow-up with me after these tests have been performed (6 weeks) and we will go over the results  Immunizations: Immunization History  Administered Date(s) Administered   Influenza  Whole 03/03/2007, 01/16/2008,  01/17/2009, 02/23/2010   Influenza, High Dose Seasonal PF 03/01/2016   Influenza,inj,Quad PF,6+ Mos 01/09/2014, 02/11/2015   Influenza-Unspecified 01/03/2012, 01/14/2017, 12/20/2017, 12/30/2018   PFIZER(Purple Top)SARS-COV-2 Vaccination 05/12/2019, 06/03/2019, 04/18/2020   Pneumococcal Conjugate-13 01/09/2014   Pneumococcal Polysaccharide-23 03/03/2007, 03/17/2016   Td 02/07/2006   Tdap 02/25/2020   Zoster Recombinat (Shingrix) 12/02/2016, 02/07/2017   Zoster, Live 04/19/2005     Current Outpatient Medications:    acetaminophen (TYLENOL) 325 MG tablet, Take 650 mg by mouth every 6 (six) hours as needed for mild pain., Disp: , Rfl:    acyclovir (ZOVIRAX) 400 MG tablet, Take 2 tablets (800 mg total) by mouth daily. TAKE TWO TABLETS BY MOUTH EVERY 4 HOURS (Patient taking differently: Take 800 mg by mouth every 4 (four) hours.), Disp: 90 tablet, Rfl: 0   amLODipine (NORVASC) 2.5 MG tablet, TAKE ONE TABLET BY MOUTH DAILY (Patient taking differently: Take 2.5 mg by mouth daily.), Disp: 90 tablet, Rfl: 3   ascorbic acid (VITAMIN C) 500 MG tablet, Take 500 mg by mouth daily., Disp: , Rfl:    cholecalciferol (VITAMIN D3) 25 MCG (1000 UNIT) tablet, Take 1,000 Units by mouth daily., Disp: , Rfl:    escitalopram (LEXAPRO) 10 MG tablet, Take 1 tablet (10 mg total) by mouth daily., Disp: 90 tablet, Rfl: 3   hydrOXYzine (ATARAX/VISTARIL) 10 MG tablet, Take 1 tablet (10 mg total) by mouth 3 (three) times daily as needed for itching., Disp: 90 tablet, Rfl: 1   levothyroxine (SYNTHROID) 25 MCG tablet, Take 1 tablet (25 mcg total) by mouth daily with breakfast., Disp: 90 tablet, Rfl: 3   omeprazole (PRILOSEC) 40 MG capsule, Take 1 capsule (40 mg total) by mouth daily., Disp: 30 capsule, Rfl: 11   oxybutynin (DITROPAN) 5 MG tablet, Take 1 tablet (5 mg total) by mouth 3 (three) times daily., Disp: 270 tablet, Rfl: 3

## 2022-01-08 ENCOUNTER — Ambulatory Visit (INDEPENDENT_AMBULATORY_CARE_PROVIDER_SITE_OTHER): Payer: Medicare Other | Admitting: Pulmonary Disease

## 2022-01-08 ENCOUNTER — Encounter: Payer: Self-pay | Admitting: Pulmonary Disease

## 2022-01-08 VITALS — BP 120/76 | HR 84 | Ht 67.0 in | Wt 165.0 lb

## 2022-01-08 DIAGNOSIS — Z87891 Personal history of nicotine dependence: Secondary | ICD-10-CM | POA: Diagnosis not present

## 2022-01-08 DIAGNOSIS — R9389 Abnormal findings on diagnostic imaging of other specified body structures: Secondary | ICD-10-CM

## 2022-01-08 DIAGNOSIS — R0902 Hypoxemia: Secondary | ICD-10-CM

## 2022-01-08 NOTE — Patient Instructions (Signed)
Hypoxemia with abnormal CT scan of the chest: We will check your oxygen level while walking today in the office High-resolution CT scan of the chest Pulmonary function test  Follow-up with me after these tests have been performed and we will go over the results

## 2022-01-12 DIAGNOSIS — S065XAA Traumatic subdural hemorrhage with loss of consciousness status unknown, initial encounter: Secondary | ICD-10-CM | POA: Diagnosis not present

## 2022-01-12 DIAGNOSIS — S0219XD Other fracture of base of skull, subsequent encounter for fracture with routine healing: Secondary | ICD-10-CM | POA: Diagnosis not present

## 2022-01-12 DIAGNOSIS — Z6825 Body mass index (BMI) 25.0-25.9, adult: Secondary | ICD-10-CM | POA: Diagnosis not present

## 2022-01-13 DIAGNOSIS — I10 Essential (primary) hypertension: Secondary | ICD-10-CM | POA: Diagnosis not present

## 2022-01-13 DIAGNOSIS — E7800 Pure hypercholesterolemia, unspecified: Secondary | ICD-10-CM | POA: Diagnosis not present

## 2022-01-13 DIAGNOSIS — M6281 Muscle weakness (generalized): Secondary | ICD-10-CM | POA: Diagnosis not present

## 2022-01-16 DIAGNOSIS — R4701 Aphasia: Secondary | ICD-10-CM | POA: Diagnosis not present

## 2022-01-16 DIAGNOSIS — S065X0D Traumatic subdural hemorrhage without loss of consciousness, subsequent encounter: Secondary | ICD-10-CM | POA: Diagnosis not present

## 2022-01-16 DIAGNOSIS — G9340 Encephalopathy, unspecified: Secondary | ICD-10-CM | POA: Diagnosis not present

## 2022-01-16 DIAGNOSIS — M800AXD Age-related osteoporosis with current pathological fracture, other site, subsequent encounter for fracture with routine healing: Secondary | ICD-10-CM | POA: Diagnosis not present

## 2022-01-16 DIAGNOSIS — F329 Major depressive disorder, single episode, unspecified: Secondary | ICD-10-CM | POA: Diagnosis not present

## 2022-01-16 DIAGNOSIS — I10 Essential (primary) hypertension: Secondary | ICD-10-CM | POA: Diagnosis not present

## 2022-01-29 DIAGNOSIS — Z23 Encounter for immunization: Secondary | ICD-10-CM | POA: Diagnosis not present

## 2022-03-04 ENCOUNTER — Telehealth: Payer: Self-pay | Admitting: Family Medicine

## 2022-03-04 NOTE — Telephone Encounter (Signed)
Copied from [place] [id number]. Topic: Medicare AWV >> Mar 04, 2022 12:09 PM Gillis Santa wrote: Reason for CRM:  LVM FOR PATIENT TO CALL [phone] KAREN TO SCHEDULE AWVS [place]

## 2022-03-31 DIAGNOSIS — L570 Actinic keratosis: Secondary | ICD-10-CM | POA: Diagnosis not present

## 2022-03-31 DIAGNOSIS — D1801 Hemangioma of skin and subcutaneous tissue: Secondary | ICD-10-CM | POA: Diagnosis not present

## 2022-03-31 DIAGNOSIS — L821 Other seborrheic keratosis: Secondary | ICD-10-CM | POA: Diagnosis not present

## 2022-03-31 DIAGNOSIS — Z23 Encounter for immunization: Secondary | ICD-10-CM | POA: Diagnosis not present

## 2022-03-31 DIAGNOSIS — D225 Melanocytic nevi of trunk: Secondary | ICD-10-CM | POA: Diagnosis not present

## 2022-03-31 DIAGNOSIS — Z85828 Personal history of other malignant neoplasm of skin: Secondary | ICD-10-CM | POA: Diagnosis not present

## 2022-04-02 ENCOUNTER — Other Ambulatory Visit: Payer: Self-pay | Admitting: Family Medicine

## 2022-04-21 DIAGNOSIS — H838X3 Other specified diseases of inner ear, bilateral: Secondary | ICD-10-CM | POA: Diagnosis not present

## 2022-04-21 DIAGNOSIS — H903 Sensorineural hearing loss, bilateral: Secondary | ICD-10-CM | POA: Diagnosis not present

## 2022-06-10 ENCOUNTER — Telehealth: Payer: Self-pay | Admitting: Family Medicine

## 2022-06-10 NOTE — Telephone Encounter (Signed)
Called patient to schedule Medicare Annual Wellness Visit (AWV). Unable to reach patient.  Last date of AWV: N/A  Please schedule an appointment at any time with Otila Kluver, [facility].  If any questions, please contact me at [phone].  Thank you ,  Shaune Pollack [facility] AWV TEAM Direct Dial [phone]

## 2022-09-08 ENCOUNTER — Encounter: Payer: Self-pay | Admitting: Family Medicine

## 2022-09-08 ENCOUNTER — Ambulatory Visit (INDEPENDENT_AMBULATORY_CARE_PROVIDER_SITE_OTHER): Payer: Medicare Other | Admitting: Family Medicine

## 2022-09-08 VITALS — BP 120/83 | HR 77 | Temp 97.1°F | Ht 67.0 in | Wt 159.4 lb

## 2022-09-08 DIAGNOSIS — E213 Hyperparathyroidism, unspecified: Secondary | ICD-10-CM | POA: Diagnosis not present

## 2022-09-08 DIAGNOSIS — N3281 Overactive bladder: Secondary | ICD-10-CM | POA: Diagnosis not present

## 2022-09-08 DIAGNOSIS — I10 Essential (primary) hypertension: Secondary | ICD-10-CM

## 2022-09-08 DIAGNOSIS — D696 Thrombocytopenia, unspecified: Secondary | ICD-10-CM

## 2022-09-08 DIAGNOSIS — R739 Hyperglycemia, unspecified: Secondary | ICD-10-CM

## 2022-09-08 DIAGNOSIS — R21 Rash and other nonspecific skin eruption: Secondary | ICD-10-CM

## 2022-09-08 DIAGNOSIS — L282 Other prurigo: Secondary | ICD-10-CM | POA: Diagnosis not present

## 2022-09-08 DIAGNOSIS — D649 Anemia, unspecified: Secondary | ICD-10-CM

## 2022-09-08 DIAGNOSIS — E7800 Pure hypercholesterolemia, unspecified: Secondary | ICD-10-CM

## 2022-09-08 DIAGNOSIS — F325 Major depressive disorder, single episode, in full remission: Secondary | ICD-10-CM

## 2022-09-08 DIAGNOSIS — E039 Hypothyroidism, unspecified: Secondary | ICD-10-CM

## 2022-09-08 DIAGNOSIS — K219 Gastro-esophageal reflux disease without esophagitis: Secondary | ICD-10-CM | POA: Diagnosis not present

## 2022-09-08 LAB — COMPREHENSIVE METABOLIC PANEL
ALT: 9 U/L (ref 0–35)
AST: 11 U/L (ref 0–37)
Albumin: 4 g/dL (ref 3.5–5.2)
Alkaline Phosphatase: 62 U/L (ref 39–117)
BUN: 15 mg/dL (ref 6–23)
CO2: 32 mEq/L (ref 19–32)
Calcium: 9.5 mg/dL (ref 8.4–10.5)
Chloride: 98 mEq/L (ref 96–112)
Creatinine, Ser: 0.75 mg/dL (ref 0.40–1.20)
GFR: 76.74 mL/min (ref >60.00)
Glucose, Bld: 90 mg/dL (ref 70–99)
Potassium: 4 mEq/L (ref 3.5–5.1)
Sodium: 138 mEq/L (ref 135–145)
Total Bilirubin: 0.6 mg/dL (ref 0.2–1.2)
Total Protein: 6.7 g/dL (ref 6.0–8.3)

## 2022-09-08 LAB — CBC
HCT: 42.5 % (ref 36.0–46.0)
Hemoglobin: 14.1 g/dL (ref 12.0–15.0)
MCHC: 33.1 g/dL (ref 30.0–36.0)
MCV: 92.1 fl (ref 78.0–100.0)
Platelets: 266 10*3/uL (ref 150.0–400.0)
RBC: 4.62 Mil/uL (ref 3.87–5.11)
RDW: 14.3 % (ref 11.5–15.5)
WBC: 4.6 10*3/uL (ref 4.0–10.5)

## 2022-09-08 LAB — LIPID PANEL
Cholesterol: 188 mg/dL (ref 0–200)
HDL: 62.4 mg/dL (ref >39.00)
LDL Cholesterol: 100 mg/dL — ABNORMAL HIGH (ref 0–99)
NonHDL: 125.48
Total CHOL/HDL Ratio: 3
Triglycerides: 125 mg/dL (ref 0.0–149.0)
VLDL: 25 mg/dL (ref 0.0–40.0)

## 2022-09-08 LAB — TSH: TSH: 2.17 u[IU]/mL (ref 0.35–5.50)

## 2022-09-08 LAB — HEMOGLOBIN A1C: Hgb A1c MFr Bld: 5.6 % (ref 4.6–6.5)

## 2022-09-08 MED ORDER — TRIAMCINOLONE ACETONIDE 0.1 % EX CREA: 1.0000 | TOPICAL_CREAM | Freq: Two times a day (BID) | CUTANEOUS | 0 refills | Status: DC

## 2022-09-08 MED ORDER — HYDROXYZINE HCL 10 MG PO TABS: 10.0000 mg | ORAL_TABLET | Freq: Three times a day (TID) | ORAL | 1 refills | Status: DC | PRN

## 2022-09-08 MED ORDER — AMLODIPINE BESYLATE 2.5 MG PO TABS: 2.5000 mg | ORAL_TABLET | Freq: Every day | ORAL | 3 refills | Status: DC

## 2022-09-08 MED ORDER — OXYBUTYNIN CHLORIDE 5 MG PO TABS: 5.0000 mg | ORAL_TABLET | Freq: Three times a day (TID) | ORAL | 3 refills | Status: DC

## 2022-09-08 MED ORDER — ESCITALOPRAM OXALATE 10 MG PO TABS: 10.0000 mg | ORAL_TABLET | Freq: Every day | ORAL | 3 refills | Status: DC

## 2022-09-08 MED ORDER — LEVOTHYROXINE SODIUM 25 MCG PO TABS
25.0000 ug | ORAL_TABLET | Freq: Every day | ORAL | 3 refills | Status: DC
Start: 2022-09-08 — End: 2023-09-16

## 2022-09-08 NOTE — Assessment & Plan Note (Signed)
Check lipids.  Not currently on any medications. 

## 2022-09-08 NOTE — Assessment & Plan Note (Signed)
Check CBC 

## 2022-09-08 NOTE — Assessment & Plan Note (Signed)
Stable on oxybutynin 5 mg 3 times daily.

## 2022-09-08 NOTE — Assessment & Plan Note (Signed)
Check A1c. 

## 2022-09-08 NOTE — Patient Instructions (Signed)
It was very nice to see you today!  We will check blood work today.  I will refill your triamcinolone.  Please continue to work on diet and exercise.  Return in about 1 year (around 09/08/2023) for Annual Physical.   Take care, Dr Jimmey Ralph  PLEASE NOTE:  If you had any lab tests, please let us know if you have not heard back within a few days. You may see your results on mychart before we have a chance to review them but we will give you a call once they are reviewed by [place].   If we ordered any referrals today, please let us know if you have not heard from their office within the next week.   If you had any urgent prescriptions sent in today, please check with the pharmacy within an hour of our visit to make sure the prescription was transmitted appropriately.   Please try these tips to maintain a healthy lifestyle:  Eat at least 3 REAL meals and 1-2 snacks per day.  Aim for no more than 5 hours between eating.  If you eat breakfast, please do so within one hour of getting up.   Each meal should contain half fruits/vegetables, one quarter protein, and one quarter carbs (no bigger than a computer mouse)  Cut down on sweet beverages. This includes juice, soda, and sweet tea.   Drink at least 1 glass of water with each meal and aim for at least 8 glasses per day  Exercise at least 150 minutes every week.    Preventive Care 25 Years and Older, Female Preventive care refers to lifestyle choices and visits with your health care provider that can promote health and wellness. Preventive care visits are also called wellness exams. What can I expect for my preventive care visit? Counseling Your health care provider may ask you questions about your: Medical history, including: Past medical problems. Family medical history. Pregnancy and menstrual history. History of falls. Current health, including: Memory and ability to understand (cognition). Emotional well-being. Home life and  relationship well-being. Sexual activity and sexual health. Lifestyle, including: Alcohol, nicotine or tobacco, and drug use. Access to firearms. Diet, exercise, and sleep habits. Work and work Astronomer. Sunscreen use. Safety issues such as seatbelt and bike helmet use. Physical exam Your health care provider will check your: Height and weight. These may be used to calculate your BMI (body mass index). BMI is a measurement that tells if you are at a healthy weight. Waist circumference. This measures the distance around your waistline. This measurement also tells if you are at a healthy weight and may help predict your risk of certain diseases, such as type 2 diabetes and high blood pressure. Heart rate and blood pressure. Body temperature. Skin for abnormal spots. What immunizations do I need?  Vaccines are usually given at various ages, according to a schedule. Your health care provider will recommend vaccines for you based on your age, medical history, and lifestyle or other factors, such as travel or where you work. What tests do I need? Screening Your health care provider may recommend screening tests for certain conditions. This may include: Lipid and cholesterol levels. Hepatitis C test. Hepatitis B test. HIV (human immunodeficiency virus) test. STI (sexually transmitted infection) testing, if you are at risk. Lung cancer screening. Colorectal cancer screening. Diabetes screening. This is done by checking your blood sugar (glucose) after you have not eaten for a while (fasting). Mammogram. Talk with your health care provider about how often  you should have regular mammograms. BRCA-related cancer screening. This may be done if you have a family history of breast, ovarian, tubal, or peritoneal cancers. Bone density scan. This is done to screen for osteoporosis. Talk with your health care provider about your test results, treatment options, and if necessary, the need for more  tests. Follow these instructions at home: Eating and drinking  Eat a diet that includes fresh fruits and vegetables, whole grains, lean protein, and low-fat dairy products. Limit your intake of foods with high amounts of sugar, saturated fats, and salt. Take vitamin and mineral supplements as recommended by your health care provider. Do not drink alcohol if your health care provider tells you not to drink. If you drink alcohol: Limit how much you have to 0-1 drink a day. Know how much alcohol is in your drink. In the [place] one drink equals one 12 oz bottle of beer (355 mL), one 5 oz glass of wine (148 mL), or one 1 oz glass of hard liquor (44 mL). Lifestyle Brush your teeth every morning and night with fluoride toothpaste. Floss one time each day. Exercise for at least 30 minutes 5 or more days each week. Do not use any products that contain nicotine or tobacco. These products include cigarettes, chewing tobacco, and vaping devices, such as e-cigarettes. If you need help quitting, ask your health care provider. Do not use drugs. If you are sexually active, practice safe sex. Use a condom or other form of protection in order to prevent STIs. Take aspirin only as told by your health care provider. Make sure that you understand how much to take and what form to take. Work with your health care provider to find out whether it is safe and beneficial for you to take aspirin daily. Ask your health care provider if you need to take a cholesterol-lowering medicine (statin). Find healthy ways to manage stress, such as: Meditation, yoga, or listening to music. Journaling. Talking to a trusted person. Spending time with friends and family. Minimize exposure to UV radiation to reduce your risk of skin cancer. Safety Always wear your seat belt while driving or riding in a vehicle. Do not drive: If you have been drinking alcohol. Do not ride with someone who has been drinking. When you are tired or  distracted. While texting. If you have been using any mind-altering substances or drugs. Wear a helmet and other protective equipment during sports activities. If you have firearms in your house, make sure you follow all gun safety procedures. What's next? Visit your health care provider once a year for an annual wellness visit. Ask your health care provider how often you should have your eyes and teeth checked. Stay up to date on all vaccines. This information is not intended to replace advice given to you by your health care provider. Make sure you discuss any questions you have with your health care provider. Document Revised: 10/01/2020 Document Reviewed: 10/01/2020 Elsevier Patient Education  2023 [employer].

## 2022-09-08 NOTE — Assessment & Plan Note (Signed)
Stable on omeprazole 40 mg daily. 

## 2022-09-08 NOTE — Assessment & Plan Note (Signed)
Stable on Lexapro 10 mg daily.  Will refill today. 

## 2022-09-08 NOTE — Assessment & Plan Note (Addendum)
Blood pressure at goal on amlodipine 2.5 mg daily.  Will refill today.

## 2022-09-08 NOTE — Progress Notes (Signed)
   Jean Bowen is a 77 y.o. female who presents today for an office visit.  Assessment/Plan:  New/Acute Problems: Rash Consistent with local inflammatory reaction secondary to tick bite.  No signs of cellulitis or tickborne illness.  No indication for prophylactic antibiotics at this point.  She will continue taking topical triamcinolone which has been working well.  Will refill today.  She will let us know if she develops any other symptoms.  Chronic Problems Addressed Today: Hypothyroidism On Synthroid 25 mcg daily.  Check TSH.  HYPERCHOLESTEROLEMIA Check lipids.  Not currently on any medications.  Major depressive disorder, single episode, in remission (HCC) Stable on Lexapro 10 mg daily.  Will refill today.  Essential hypertension Blood pressure at goal on amlodipine 2.5 mg daily.  Will refill today.  GERD Stable on omeprazole 40 mg daily.  Overactive bladder Stable on oxybutynin 5 mg 3 times daily.  Thrombocytopenia (HCC) Check CBC.  Hyperparathyroidism (HCC) Check labs.  Hyperglycemia Check A1c.  Anemia Check CBC.  Preventative health care No longer wishes to pursue colonoscopy due to age.  She is up-to-date on vaccines.  Up-to-date on other age-appropriate screenings.    Subjective:  HPI:  See A/P for status of chronic conditions.  Patient is here today for annual visit.  She was hospitalized about 10 months after suffering a fall and suffering loss of consciousness. Was found to have a small acute subdural hematoma and was admitted for about 2 weeks. No operative intervention was needed.  She was discharged to SNF and then back to home.  She has been doing well for the last several months.  No recurrent falls.  She has also had a few tick bites on lower extremities.  She was able to remove them without difficulty.  Has a little rash where the tick were attached.  Able to ask for less than today.  No fever or chills.  Topical triamcinolone works well.        Objective:  Physical Exam: BP 120/83   Pulse 77   Temp (!) 97.1 F (36.2 C) (Temporal)   Ht 5\' 7"  (1.702 m)   Wt 159 lb 6.4 oz (72.3 kg)   SpO2 99%   BMI 24.97 kg/m   Wt Readings from Last 3 Encounters:  09/08/22 159 lb 6.4 oz (72.3 kg)  01/08/22 165 lb (74.8 kg)  11/24/21 176 lb 5.9 oz (80 kg)    Gen: No acute distress, resting comfortably CV: Regular rate and rhythm with no murmurs appreciated Pulm: Normal work of breathing, clear to auscultation bilaterally with no crackles, wheezes, or rhonchi Skin: Several erythematous plaques on the lower extremities consistent with local inflammatory reaction secondary to arthropod bites. Neuro: Grossly normal, moves all extremities Psych: Normal affect and thought content      Kathye Cipriani M. Jimmey Ralph, MD 09/08/2022 10:07 AM

## 2022-09-08 NOTE — Assessment & Plan Note (Signed)
Check labs 

## 2022-09-08 NOTE — Assessment & Plan Note (Signed)
On Synthroid 25 mcg daily.  Check TSH.

## 2022-09-10 NOTE — Progress Notes (Signed)
Great news! Labs are all stable. She should keep up the good work and we can recheck in a year.  Katina Degree. Jimmey Ralph, MD 09/10/2022 12:34 PM

## 2022-10-11 ENCOUNTER — Other Ambulatory Visit: Payer: Self-pay | Admitting: Family Medicine

## 2022-12-12 ENCOUNTER — Other Ambulatory Visit: Payer: Self-pay | Admitting: Family Medicine

## 2023-01-11 ENCOUNTER — Ambulatory Visit (INDEPENDENT_AMBULATORY_CARE_PROVIDER_SITE_OTHER): Payer: Medicare Other

## 2023-01-11 VITALS — Wt 159.0 lb

## 2023-01-11 DIAGNOSIS — Z0000 Encounter for general adult medical examination without abnormal findings: Secondary | ICD-10-CM | POA: Diagnosis not present

## 2023-01-11 NOTE — Progress Notes (Signed)
Subjective:   Jean Bowen is a 77 y.o. female who presents for Medicare Annual (Subsequent) preventive examination.  Visit Complete: Virtual  I connected with  Jean Bowen on 01/11/23 by a audio enabled telemedicine application and verified that I am speaking with the correct person using two identifiers.  Patient Location: Home  Provider Location: Home Office  I discussed the limitations of evaluation and management by telemedicine. The patient expressed understanding and agreed to proceed.    Vital Signs: Unable to obtain new vitals due to this being a telehealth visit.  Cardiac Risk Factors include: advanced age (>64men, >60 women);hypertension;dyslipidemia     Objective:    Today's Vitals   01/11/23 1537  Weight: 159 lb (72.1 kg)   Body mass index is 24.9 kg/m.     01/11/2023    3:42 PM 11/21/2020   11:37 AM 07/03/2019   12:15 PM 06/25/2019    1:10 PM 02/23/2019    2:48 PM 03/17/2016    3:43 PM 03/19/2015    6:21 PM  Advanced Directives  Does Patient Have a Medical Advance Directive? Yes Yes No No Yes No No  Type of Estate agent of [place];Living will Living will   Living will;Healthcare Power of Attorney    Does patient want to make changes to medical advance directive?     No - Patient declined    Copy of Healthcare Power of Attorney in Chart? No - copy requested    No - copy requested    Would patient like information on creating a medical advance directive?   No - Patient declined No - Patient declined   No - patient declined information    Current Medications (verified) Outpatient Encounter Medications as of 01/11/2023  Medication Sig   acetaminophen (TYLENOL) 325 MG tablet Take 650 mg by mouth every 6 (six) hours as needed for mild pain.   acyclovir (ZOVIRAX) 400 MG tablet TAKE 2 TABLETS BY MOUTH EVERY 4 HOURS   amLODipine (NORVASC) 2.5 MG tablet Take 1 tablet (2.5 mg total) by mouth daily.   ascorbic acid (VITAMIN C) 500 MG tablet  Take 500 mg by mouth daily.   cholecalciferol (VITAMIN D3) 25 MCG (1000 UNIT) tablet Take 1,000 Units by mouth daily.   escitalopram (LEXAPRO) 10 MG tablet Take 1 tablet (10 mg total) by mouth daily.   hydrOXYzine (ATARAX) 10 MG tablet Take 1 tablet (10 mg total) by mouth 3 (three) times daily as needed for itching.   levothyroxine (SYNTHROID) 25 MCG tablet Take 1 tablet (25 mcg total) by mouth daily with breakfast.   omeprazole (PRILOSEC) 40 MG capsule Take 1 capsule (40 mg total) by mouth daily.   oxybutynin (DITROPAN) 5 MG tablet Take 1 tablet (5 mg total) by mouth 3 (three) times daily.   triamcinolone cream (KENALOG) 0.1 % Apply 1 Application topically 2 (two) times daily.   No facility-administered encounter medications on file as of 01/11/2023.    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   Stress Cardiolite  02/14/2006   TUMOR REMOVAL  2003   removal 5 lb benign tumor from abdomen   Family History  Problem Relation Age of Onset    Melanoma Mother    Lung cancer Mother    Colon cancer Father        at advanced age   Arthritis Father    Lung cancer Brother    Heart attack Other        grandfather   Diabetes Other        grandfather/grandmother   Social History   Socioeconomic History   Marital status: Widowed    Spouse name: Venisha Gillion    Number of children: Not on file   Years of education: Not on file   Highest education level: Not on file  Occupational History   Occupation: Sales executive and travel  Tobacco Use   Smoking status: Former    Current packs/day: 0.00    Average packs/day: 2.0 packs/day for 30.0 years (60.0 ttl pk-yrs)    Types: Cigarettes    Start date: 05/23/1971    Quit date: 05/22/2001    Years since quitting: 21.6   Smokeless tobacco: Never  Vaping Use   Vaping status: Never Used  Substance and Sexual Activity   Alcohol use: Yes    Alcohol/week: 3.0 standard drinks of alcohol    Types: 3 Glasses of wine per week    Comment: 1-2/week   Drug use: No   Sexual activity: Not on file  Other Topics Concern   Not on file  Social History Narrative   1 dog   Regular exercise-yes   Husband died September 25, 2018     Social Determinants of Health   Financial Resource Strain: Low Risk  (01/11/2023)   Overall Financial Resource Strain (CARDIA)    Difficulty of Paying Living Expenses: Not hard at all  Food Insecurity: No Food Insecurity (01/11/2023)   Hunger Vital Sign    Worried About Running Out of Food in the Last Year: Never true    Ran Out of Food in the Last Year: Never true  Transportation Needs: No Transportation Needs (01/11/2023)   PRAPARE - Administrator, Civil Service (Medical): No    Lack of Transportation (Non-Medical): No  Physical Activity: Sufficiently Active (01/11/2023)   Exercise Vital Sign    Days of Exercise per Week: 7 days    Minutes of Exercise per Session: 30 min  Stress: Patient Declined (01/11/2023)   [employer] of Occupational  Health - Occupational Stress Questionnaire    Feeling of Stress : Patient declined  Social Connections: Moderately Isolated (01/11/2023)   Social Connection and Isolation Panel [NHANES]    Frequency of Communication with Friends and Family: More than three times a week    Frequency of Social Gatherings with Friends and Family: More than three times a week    Attends Religious Services: Never    Active Member  of Clubs or Organizations: Yes    Attends Banker Meetings: 1 to 4 times per year    Marital Status: Widowed    Tobacco Counseling Counseling given: Not Answered   Clinical Intake:  Pre-visit preparation completed: Yes  Pain : No/denies pain     BMI - recorded: 24.9 Nutritional Status: BMI 25 -29 Overweight Nutritional Risks: None Diabetes: No  How often do you need to have someone help you when you read instructions, pamphlets, or other written materials from your doctor or pharmacy?: 1 - Never  Interpreter Needed?: No  Information entered by :: Lanier Ensign, LPN   Activities of Daily Living    01/11/2023    3:38 PM  In your present state of health, do you have any difficulty performing the following activities:  Hearing? 0  Vision? 0  Difficulty concentrating or making decisions? 0  Walking or climbing stairs? 0  Dressing or bathing? 0  Doing errands, shopping? 0  Preparing Food and eating ? N  Using the Toilet? N  In the past six months, have you accidently leaked urine? N  Do you have problems with loss of bowel control? N  Managing your Medications? N  Managing your Finances? N  Housekeeping or managing your Housekeeping? N    Patient Care Team: Ardith Dark, MD as PCP - General (Family Medicine) Ollen Gross, MD as Attending Physician (Orthopedic Surgery) Hart Carwin, MD (Inactive) as Attending Physician (Gastroenterology) Donzetta Starch, MD as Consulting Physician (Dermatology) Ernesto Rutherford, MD as Consulting Physician  (Ophthalmology) Royden Purl, AUD as Consulting Physician (Audiology)  Indicate any recent Medical Services you may have received from other than [facility] providers in the past year (date may be approximate).     Assessment:   This is a routine wellness examination for Su.  Hearing/Vision screen Hearing Screening - Comments:: Pt denies any hearing  issues  Vision Screening - Comments:: Pt follows up with Dr Dione Booze  for annual eye exams    Goals Addressed             This Visit's Progress    Patient Stated       Lose weight per pt        Depression Screen    01/11/2023    3:40 PM 09/08/2022    9:09 AM 11/20/2021   11:06 AM 11/20/2021   11:05 AM 11/21/2020   11:36 AM 03/13/2019    4:36 PM 02/23/2019    2:49 PM  PHQ 2/9 Scores  PHQ - 2 Score 0 0 0 0 1 0 0  PHQ- 9 Score  0 2   6     Fall Risk    01/11/2023    3:42 PM 09/08/2022    9:09 AM 11/20/2021   11:06 AM 11/21/2020   11:38 AM 03/13/2019    4:00 PM  Fall Risk   Falls in the past year? 1 1 1  0 0  Number falls in past yr: 1 1 1  0   Injury with Fall? 1 1 1  0   Comment injured and went to ER      Risk for fall due to : Impaired vision;History of fall(s) No Fall Risks No Fall Risks Impaired vision   Follow up Falls prevention discussed   Falls prevention discussed     MEDICARE RISK AT HOME: Medicare Risk at Home Any stairs in or around the home?: Yes If so, are there any without handrails?: No Home free of loose throw  rugs in walkways, pet beds, electrical cords, etc?: Yes Adequate lighting in your home to reduce risk of falls?: Yes Life alert?: No Use of a cane, walker or w/c?: No Grab bars in the bathroom?: Yes Shower chair or bench in shower?: Yes Elevated toilet seat or a handicapped toilet?: No  TIMED UP AND GO:  Was the test performed?  No    Cognitive Function:        01/11/2023    3:44 PM 11/21/2020   11:40 AM 02/23/2019    2:50 PM  6CIT Screen  What Year? 0 points 0 points 0 points  What month? 0  points 0 points 0 points  What time? 0 points 0 points 0 points  Count back from 20 0 points 0 points 0 points  Months in reverse 0 points 0 points 0 points  Repeat phrase 0 points 0 points 0 points  Total Score 0 points 0 points 0 points    Immunizations Immunization History  Administered Date(s) Administered   Influenza Whole 03/03/2007, 01/16/2008, 01/17/2009, 02/23/2010   Influenza, High Dose Seasonal PF 03/01/2016   Influenza,inj,Quad PF,6+ Mos 01/09/2014, 02/11/2015   Influenza-Unspecified 01/03/2012, 01/14/2017, 12/20/2017, 12/30/2018, 01/29/2022   PFIZER Comirnaty(Gray Top)Covid-19 Tri-Sucrose Vaccine 03/31/2022   PFIZER(Purple Top)SARS-COV-2 Vaccination 05/12/2019, 06/03/2019, 02/06/2020, 11/24/2020   Pneumococcal Conjugate-13 01/09/2014   Pneumococcal Polysaccharide-23 03/03/2007, 03/17/2016   Td 02/07/2006   Td (Adult), 2 Lf Tetanus Toxid, Preservative Free 02/07/2006   Tdap 02/25/2020   Unspecified SARS-COV-2 Vaccination 02/27/2021   Zoster Recombinant(Shingrix) 12/02/2016, 02/07/2017   Zoster, Live 04/19/2005    TDAP status: Up to date  Flu Vaccine status: Due, Education has been provided regarding the importance of this vaccine. Advised may receive this vaccine at local pharmacy or Health Dept. Aware to provide a copy of the vaccination record if obtained from local pharmacy or Health Dept. Verbalized acceptance and understanding.  Pneumococcal vaccine status: Up to date  Covid-19 vaccine status: Information provided on how to obtain vaccines.   Qualifies for Shingles Vaccine? Yes   Zostavax completed Yes   Shingrix Completed?: Yes  Screening Tests Health Maintenance  Topic Date Due   COVID-19 Vaccine (7 - 2023-24 season) 12/19/2022   INFLUENZA VACCINE  07/18/2023 (Originally 11/18/2022)   Medicare Annual Wellness (AWV)  01/11/2024   DTaP/Tdap/Td (4 - Td or Tdap) 02/24/2030   Pneumonia Vaccine 89+ Years old  Completed   DEXA SCAN  Completed   Hepatitis C  Screening  Completed   Zoster Vaccines- Shingrix  Completed   HPV VACCINES  Aged Out   Colonoscopy  Discontinued    Health Maintenance  Health Maintenance Due  Topic Date Due   COVID-19 Vaccine (7 - 2023-24 season) 12/19/2022    Colorectal cancer screening: No longer required.   Mammogram status: Completed 04/02/16. Repeat every year  Bone Density status: Completed 02/25/15. Results reflect: Bone density results: OSTEOPOROSIS. Repeat every 2 years.   Additional Screening:  Hepatitis C Screening:  Completed 06/11/16  Vision Screening: Recommended annual ophthalmology exams for early detection of glaucoma and other disorders of the eye. Is the patient up to date with their annual eye exam?  Yes  Who is the provider or what is the name of the office in which the patient attends annual eye exams? Dr Dione Booze  If pt is not established with a provider, would they like to be referred to a provider to establish care? No .   Dental Screening: Recommended annual dental exams for proper oral hygiene  Community Resource Referral / Chronic Care Management: CRR required this visit?  No   CCM required this visit?  No     Plan:     I have personally reviewed and noted the following in the patient's chart:   Medical and social history Use of alcohol, tobacco or illicit drugs  Current medications and supplements including opioid prescriptions. Patient is not currently taking opioid prescriptions. Functional ability and status Nutritional status Physical activity Advanced directives List of other physicians Hospitalizations, surgeries, and ER visits in previous 12 months Vitals Screenings to include cognitive, depression, and falls Referrals and appointments  In addition, I have reviewed and discussed with patient certain preventive protocols, quality metrics, and best practice recommendations. A written personalized care plan for preventive services as well as general preventive  health recommendations were provided to patient.     Marzella Schlein, LPN   1/61/0960   After Visit Summary: (MyChart) Due to this being a telephonic visit, the after visit summary with patients personalized plan was offered to patient via MyChart   Nurse Notes: none

## 2023-02-10 ENCOUNTER — Other Ambulatory Visit: Payer: Self-pay | Admitting: Family Medicine

## 2023-03-16 DIAGNOSIS — Z23 Encounter for immunization: Secondary | ICD-10-CM | POA: Diagnosis not present

## 2023-03-21 ENCOUNTER — Other Ambulatory Visit: Payer: Self-pay | Admitting: Family Medicine

## 2023-04-27 ENCOUNTER — Ambulatory Visit (INDEPENDENT_AMBULATORY_CARE_PROVIDER_SITE_OTHER): Payer: Medicare Other

## 2023-04-27 DIAGNOSIS — H43813 Vitreous degeneration, bilateral: Secondary | ICD-10-CM | POA: Diagnosis not present

## 2023-04-27 DIAGNOSIS — H04123 Dry eye syndrome of bilateral lacrimal glands: Secondary | ICD-10-CM | POA: Diagnosis not present

## 2023-05-03 ENCOUNTER — Other Ambulatory Visit: Payer: Self-pay | Admitting: Family Medicine

## 2023-05-06 DIAGNOSIS — Z23 Encounter for immunization: Secondary | ICD-10-CM | POA: Diagnosis not present

## 2023-06-13 DIAGNOSIS — L853 Xerosis cutis: Secondary | ICD-10-CM | POA: Diagnosis not present

## 2023-06-13 DIAGNOSIS — L821 Other seborrheic keratosis: Secondary | ICD-10-CM | POA: Diagnosis not present

## 2023-06-13 DIAGNOSIS — L820 Inflamed seborrheic keratosis: Secondary | ICD-10-CM | POA: Diagnosis not present

## 2023-06-13 DIAGNOSIS — Z85828 Personal history of other malignant neoplasm of skin: Secondary | ICD-10-CM | POA: Diagnosis not present

## 2023-06-13 DIAGNOSIS — D1801 Hemangioma of skin and subcutaneous tissue: Secondary | ICD-10-CM | POA: Diagnosis not present

## 2023-06-23 ENCOUNTER — Other Ambulatory Visit: Payer: Self-pay | Admitting: Family Medicine

## 2023-06-23 NOTE — Telephone Encounter (Signed)
 Pt requesting refill for Acyclovir 400 mg, 2 tablets every 4 hours. Okay to refill?

## 2023-08-10 ENCOUNTER — Other Ambulatory Visit: Payer: Self-pay | Admitting: Family Medicine

## 2023-08-11 DIAGNOSIS — Z23 Encounter for immunization: Secondary | ICD-10-CM | POA: Diagnosis not present

## 2023-09-04 ENCOUNTER — Encounter (HOSPITAL_BASED_OUTPATIENT_CLINIC_OR_DEPARTMENT_OTHER): Payer: Self-pay | Admitting: Emergency Medicine

## 2023-09-04 ENCOUNTER — Emergency Department (HOSPITAL_BASED_OUTPATIENT_CLINIC_OR_DEPARTMENT_OTHER)
Admission: EM | Admit: 2023-09-04 | Discharge: 2023-09-04 | Disposition: A | Discharge status: Home / Self Care | Attending: Emergency Medicine | Admitting: Emergency Medicine

## 2023-09-04 ENCOUNTER — Other Ambulatory Visit: Payer: Self-pay

## 2023-09-04 DIAGNOSIS — W57XXXA Bitten or stung by nonvenomous insect and other nonvenomous arthropods, initial encounter: Secondary | ICD-10-CM | POA: Diagnosis not present

## 2023-09-04 DIAGNOSIS — S30861A Insect bite (nonvenomous) of abdominal wall, initial encounter: Secondary | ICD-10-CM

## 2023-09-04 DIAGNOSIS — L299 Pruritus, unspecified: Secondary | ICD-10-CM

## 2023-09-04 MED ORDER — TRIAMCINOLONE ACETONIDE 0.1 % EX OINT: 1.0000 | TOPICAL_OINTMENT | Freq: Two times a day (BID) | CUTANEOUS | 0 refills | Status: DC

## 2023-09-04 NOTE — ED Provider Notes (Signed)
 [place] EMERGENCY DEPARTMENT AT [facility] Provider Note   CSN: [id number] Arrival date & time: 09/04/23  1201     History  Chief Complaint  Patient presents with   Insect Bite    Jean Bowen is a 78 y.o. female.  HPI Patient reports she got several tick bites on her trunk.  Patient does not know what kind of ticks they were.  These were 4 days ago.  She reports she had a neighbor removed them but she has been having very itchy spots since.  She tried hydroxyzine  but got no relief from itching.  Patient denies any other symptoms.  No fevers no chills no headaches no neck stiffness no malaise.    Home Medications Prior to Admission medications   Medication Sig Start Date End Date Taking? Authorizing Provider  triamcinolone  ointment (KENALOG ) 0.1 % Apply 1 Application topically 2 (two) times daily. 09/04/23  Yes Wynetta Heckle, MD  acetaminophen  (TYLENOL ) 325 MG tablet Take 650 mg by mouth every 6 (six) hours as needed for mild pain.    [provider]  acyclovir  (ZOVIRAX ) 400 MG tablet TAKE 2 TABLETS BY MOUTH EVERY 4 HOURS 08/10/23   Rodney Clamp, MD  amLODipine  (NORVASC ) 2.5 MG tablet Take 1 tablet (2.5 mg total) by mouth daily. 09/08/22   Rodney Clamp, MD  ascorbic acid (VITAMIN C) 500 MG tablet Take 500 mg by mouth daily.    [provider]  cholecalciferol  (VITAMIN D3) 25 MCG (1000 UNIT) tablet Take 1,000 Units by mouth daily.    [provider]  escitalopram  (LEXAPRO ) 10 MG tablet Take 1 tablet (10 mg total) by mouth daily. 09/08/22   Rodney Clamp, MD  hydrOXYzine  (ATARAX ) 10 MG tablet Take 1 tablet (10 mg total) by mouth 3 (three) times daily as needed for itching. 09/08/22   Rodney Clamp, MD  levothyroxine  (SYNTHROID ) 25 MCG tablet Take 1 tablet (25 mcg total) by mouth daily with breakfast. 09/08/22   Rodney Clamp, MD  omeprazole  (PRILOSEC) 40 MG capsule Take 1 capsule (40 mg total) by mouth daily. 10/16/14   Pietro Bridegroom, MD   oxybutynin  (DITROPAN ) 5 MG tablet Take 1 tablet (5 mg total) by mouth 3 (three) times daily. 09/08/22   Rodney Clamp, MD  triamcinolone  cream (KENALOG ) 0.1 % Apply 1 Application topically 2 (two) times daily. 09/08/22   Rodney Clamp, MD      Allergies    Vibra-tab [doxycycline], Losartan potassium, and Morphine and codeine    Review of Systems   Review of Systems  Physical Exam Updated Vital Signs BP (!) 135/96   Pulse 70   Temp 98 F (36.7 C) (Oral)   Resp 18   Ht 5\' 7"  (1.702 m)   Wt 77.1 kg   SpO2 92%   BMI 26.63 kg/m  Physical Exam Constitutional:      Comments: Alert and well in appearance.  Nontoxic.  Clear mental status.  Sitting up in the chair.  HENT:     Head: Normocephalic and atraumatic.     Mouth/Throat:     Pharynx: Oropharynx is clear.  Eyes:     Extraocular Movements: Extraocular movements intact.  Cardiovascular:     Rate and Rhythm: Normal rate and regular rhythm.  Pulmonary:     Effort: Pulmonary effort is normal.     Breath sounds: Normal breath sounds.  Musculoskeletal:        General: Normal range of motion.  Skin:  General: Skin is warm and dry.     Comments: See attached images for small papules on the trunk.  Neurological:     General: No focal deficit present.     Mental Status: She is oriented to person, place, and time.     Motor: No weakness.     Coordination: Coordination normal.     Comments: Normal cognitive function.  Up and ambulatory without difficulty.  Psychiatric:        Mood and Affect: Mood normal.        ED Results / Procedures / Treatments   Labs (all labs ordered are listed, but only abnormal results are displayed) Labs Reviewed - No data to display  EKG None  Radiology No results found.  Procedures Procedures    Medications Ordered in ED Medications - No data to display  ED Course/ Medical Decision Making/ A&P                                 Medical Decision Making  Patient presents as  reports having ticks in the areas identified and having remove them.  She does not know what species of tick it was.  At this time she has no signs of infectious illness in association with this.  She has limited papules and presented due to how pruritic they are.  At this time I do not think empiric antibiotics are indicated.  Will treat for pruritus with local triamcinolone  ointment.  Patient is aware of the need to return immediately if there is any fever malaise or any other constitutional symptoms.        Final Clinical Impression(s) / ED Diagnoses Final diagnoses:  Insect bite of abdominal wall, initial encounter  Pruritus    Rx / DC Orders ED Discharge Orders          Ordered    triamcinolone  ointment (KENALOG ) 0.1 %  2 times daily        09/04/23 1330              Wynetta Heckle, MD 09/04/23 1340

## 2023-09-04 NOTE — Discharge Instructions (Signed)
 1.  Keep the bite areas clean and dry.  Apply triamcinolone  ointment twice a day to the itchy areas. 2.  Return to the emergency department immediately for develop a fever, bull's-eye rash, other advancing or concerning rash, headache, feeling generally sick or other concerning symptoms.

## 2023-09-04 NOTE — ED Notes (Signed)
 Discharge paperwork given and verbally understood. No assessment, only D/C.Aaron AasAaron Aas

## 2023-09-04 NOTE — ED Triage Notes (Signed)
 Pt caox4, ambulatory NAD c/o pain and itching in torso since removing multiple ticks off the abd approx 3-5 days ago.

## 2023-09-13 ENCOUNTER — Encounter: Payer: Self-pay | Admitting: Physician Assistant

## 2023-09-13 ENCOUNTER — Ambulatory Visit (INDEPENDENT_AMBULATORY_CARE_PROVIDER_SITE_OTHER): Admitting: Physician Assistant

## 2023-09-13 VITALS — BP 136/88 | HR 85 | Temp 97.9°F | Ht 67.0 in | Wt 172.0 lb

## 2023-09-13 DIAGNOSIS — W57XXXD Bitten or stung by nonvenomous insect and other nonvenomous arthropods, subsequent encounter: Secondary | ICD-10-CM

## 2023-09-13 DIAGNOSIS — L299 Pruritus, unspecified: Secondary | ICD-10-CM

## 2023-09-13 DIAGNOSIS — S30861D Insect bite (nonvenomous) of abdominal wall, subsequent encounter: Secondary | ICD-10-CM

## 2023-09-13 MED ORDER — TRIAMCINOLONE ACETONIDE 0.1 % EX OINT: 1.0000 | TOPICAL_OINTMENT | Freq: Two times a day (BID) | CUTANEOUS | 1 refills | Status: AC

## 2023-09-13 NOTE — Progress Notes (Signed)
 Patient ID: Jean Bowen, female    DOB: 1945/07/18, 78 y.o.   MRN: [id number]   Assessment & Plan:  Insect bite of abdomen, subsequent encounter  Pruritus  Other orders -     Triamcinolone  Acetonide; Apply 1 Application topically 2 (two) times daily.  Dispense: 30 g; Refill: 1    Assessment & Plan Tick bites Numerous tick bites with no signs of infectious illness. No symptoms of tick-borne disease such as headaches, arthralgia, or fever. Triamcinolone  ointment effectively alleviates pruritus. No new ticks present on examination. [place] and frequently checks for ticks, with assistance from a neighbor when needed. - Refill triamcinolone  ointment for pruritus as needed - Advise to monitor for symptoms of tick-borne diseases - Schedule appointment with regular PCP, Dr. Daneil Dunker, for overdue visit and medication refills      No follow-ups on file.    Subjective:    Chief Complaint  Patient presents with   Follow-up    Pt in office for ED follow up; seen for tick bites; not seen PCP since May 2024; pt states she had several tick bites all over abdomen 1 wk ago; pt was given cream and asking for refill; pt has one other tick bite since visit to ED. Pt gave medications wanting to have refilled and advised since not seeing her PCP today, and not seeing him in over 1 yr she will need to schedule OV to see PCP for refills.     HPI Discussed the use of AI scribe software for clinical note transcription with the patient, who gave verbal consent to proceed.  History of Present Illness Jean Bowen is a 79 year old female who presents for a follow-up visit after numerous tick bites.  She was seen at the emergency department on Sep 04, 2023, for numerous tick bites. At that time, she had no signs of infectious illness and was not experiencing symptoms associated with tick-borne diseases.  She [place] with [place] and frequently checks herself for  ticks, especially after being outside. She does not have a lawn, [place] and [place], which increases her exposure to ticks. She has a neighbor who assists her in checking for ticks when needed.  Currently, she has no symptoms such as headaches, joint pain, or fevers. She uses triamcinolone  ointment, which effectively stops the itching immediately. She wears one of her husband's old shirts to prevent further irritation.  She has not experienced any tick-borne disease symptoms and continues to monitor for any new bites or symptoms. No symptoms of Lyme disease, such as bullseye rash, headaches, joint pain, or fevers.     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   Stress Cardiolite  02/14/2006   TUMOR REMOVAL  2003   removal 5 lb benign tumor from abdomen    Family History  Problem Relation Age of Onset   Melanoma Mother    Lung cancer Mother    Colon cancer Father        at advanced age   Arthritis Father    Lung cancer Brother    Heart attack Other        grandfather   Diabetes Other        grandfather/grandmother    Social  History   Tobacco Use   Smoking status: Former    Current packs/day: 0.00    Average packs/day: 2.0 packs/day for 30.0 years (60.0 ttl pk-yrs)    Types: Cigarettes    Start date: 05/23/1971    Quit date: 05/22/2001    Years since quitting: 22.3   Smokeless tobacco: Never  Vaping Use   Vaping status: Never Used  Substance Use Topics   Alcohol use: Yes    Alcohol/week: 3.0 standard drinks of alcohol    Types: 3 Glasses of wine per week    Comment: 1-2/week   Drug use: No     Allergies  Allergen Reactions   Vibra-Tab [Doxycycline] Swelling    Swelling of lips and throat   Losartan Potassium Swelling    Angioedema    Morphine And Codeine Nausea And Vomiting    Caused vomiting when given too fast in IV.    Review of Systems NEGATIVE UNLESS OTHERWISE INDICATED IN HPI      Objective:     BP 136/88 (BP Location: Left Arm, Patient Position: Sitting, Cuff Size: Normal)   Pulse 85   Temp 97.9 F (36.6 C) (Temporal)   Ht 5\' 7"  (1.702 m)   Wt 172 lb (78 kg)   SpO2 94%   BMI 26.94 kg/m   Wt Readings from Last 3 Encounters:  09/13/23 172 lb (78 kg)  09/04/23 170 lb (77.1 kg)  01/11/23 159 lb (72.1 kg)    BP Readings from Last 3 Encounters:  09/13/23 136/88  09/04/23 (!) 147/84  09/08/22 120/83     Physical Exam Vitals and nursing note reviewed.  Constitutional:      Appearance: Normal appearance.  Skin:    Findings: Lesion (several healing papular / scabbed small lesions on abdomen with no evidence of erythema or bullseye marks) present.  Neurological:     Mental Status: She is alert.  Psychiatric:        Mood and Affect: Mood normal.        Behavior: Behavior normal.             Lamin Chandley M Makhayla Mcmurry, PA-C

## 2023-09-16 ENCOUNTER — Ambulatory Visit (INDEPENDENT_AMBULATORY_CARE_PROVIDER_SITE_OTHER): Admitting: Family Medicine

## 2023-09-16 VITALS — BP 132/89 | HR 73 | Temp 97.3°F | Ht 67.0 in | Wt 172.0 lb

## 2023-09-16 DIAGNOSIS — D649 Anemia, unspecified: Secondary | ICD-10-CM | POA: Diagnosis not present

## 2023-09-16 DIAGNOSIS — E7800 Pure hypercholesterolemia, unspecified: Secondary | ICD-10-CM | POA: Diagnosis not present

## 2023-09-16 DIAGNOSIS — R739 Hyperglycemia, unspecified: Secondary | ICD-10-CM

## 2023-09-16 DIAGNOSIS — E785 Hyperlipidemia, unspecified: Secondary | ICD-10-CM | POA: Diagnosis not present

## 2023-09-16 DIAGNOSIS — L282 Other prurigo: Secondary | ICD-10-CM

## 2023-09-16 DIAGNOSIS — I10 Essential (primary) hypertension: Secondary | ICD-10-CM | POA: Diagnosis not present

## 2023-09-16 DIAGNOSIS — N3281 Overactive bladder: Secondary | ICD-10-CM | POA: Diagnosis not present

## 2023-09-16 DIAGNOSIS — L299 Pruritus, unspecified: Secondary | ICD-10-CM

## 2023-09-16 DIAGNOSIS — E039 Hypothyroidism, unspecified: Secondary | ICD-10-CM

## 2023-09-16 DIAGNOSIS — K219 Gastro-esophageal reflux disease without esophagitis: Secondary | ICD-10-CM

## 2023-09-16 DIAGNOSIS — D696 Thrombocytopenia, unspecified: Secondary | ICD-10-CM

## 2023-09-16 DIAGNOSIS — F325 Major depressive disorder, single episode, in full remission: Secondary | ICD-10-CM

## 2023-09-16 DIAGNOSIS — E213 Hyperparathyroidism, unspecified: Secondary | ICD-10-CM

## 2023-09-16 LAB — CBC
HCT: 42.9 % (ref 36.0–46.0)
Hemoglobin: 14.3 g/dL (ref 12.0–15.0)
MCHC: 33.3 g/dL (ref 30.0–36.0)
MCV: 90.5 fl (ref 78.0–100.0)
Platelets: 227 10*3/uL (ref 150.0–400.0)
RBC: 4.74 Mil/uL (ref 3.87–5.11)
RDW: 14.6 % (ref 11.5–15.5)
WBC: 4.4 10*3/uL (ref 4.0–10.5)

## 2023-09-16 LAB — COMPREHENSIVE METABOLIC PANEL WITH GFR
ALT: 13 U/L (ref 0–35)
AST: 15 U/L (ref 0–37)
Albumin: 4.1 g/dL (ref 3.5–5.2)
Alkaline Phosphatase: 64 U/L (ref 39–117)
BUN: 15 mg/dL (ref 6–23)
CO2: 30 meq/L (ref 19–32)
Calcium: 9.1 mg/dL (ref 8.4–10.5)
Chloride: 100 meq/L (ref 96–112)
Creatinine, Ser: 0.63 mg/dL (ref 0.40–1.20)
GFR: 84.89 mL/min (ref >60.00)
Glucose, Bld: 88 mg/dL (ref 70–99)
Potassium: 3.9 meq/L (ref 3.5–5.1)
Sodium: 139 meq/L (ref 135–145)
Total Bilirubin: 0.6 mg/dL (ref 0.2–1.2)
Total Protein: 7.1 g/dL (ref 6.0–8.3)

## 2023-09-16 LAB — LIPID PANEL
Cholesterol: 180 mg/dL (ref 0–200)
HDL: 63 mg/dL (ref >39.00)
LDL Cholesterol: 88 mg/dL (ref 0–99)
NonHDL: 117.39
Total CHOL/HDL Ratio: 3
Triglycerides: 149 mg/dL (ref 0.0–149.0)
VLDL: 29.8 mg/dL (ref 0.0–40.0)

## 2023-09-16 LAB — TSH: TSH: 1.78 u[IU]/mL (ref 0.35–5.50)

## 2023-09-16 LAB — HEMOGLOBIN A1C: Hgb A1c MFr Bld: 5.7 % (ref 4.6–6.5)

## 2023-09-16 MED ORDER — TRIAMCINOLONE ACETONIDE 0.1 % EX CREA: 1.0000 | TOPICAL_CREAM | Freq: Two times a day (BID) | CUTANEOUS | 0 refills | Status: AC

## 2023-09-16 MED ORDER — OXYBUTYNIN CHLORIDE 5 MG PO TABS: 5.0000 mg | ORAL_TABLET | Freq: Three times a day (TID) | ORAL | 3 refills | Status: AC

## 2023-09-16 MED ORDER — LEVOTHYROXINE SODIUM 25 MCG PO TABS: 25.0000 ug | ORAL_TABLET | Freq: Every day | ORAL | 3 refills | Status: DC

## 2023-09-16 MED ORDER — OMEPRAZOLE 40 MG PO CPDR: 40.0000 mg | DELAYED_RELEASE_CAPSULE | Freq: Every day | ORAL | 3 refills | Status: AC

## 2023-09-16 MED ORDER — ACYCLOVIR 400 MG PO TABS: 400.0000 mg | ORAL_TABLET | Freq: Two times a day (BID) | ORAL | 3 refills | Status: AC

## 2023-09-16 MED ORDER — ESCITALOPRAM OXALATE 10 MG PO TABS: 10.0000 mg | ORAL_TABLET | Freq: Every day | ORAL | 3 refills | Status: AC

## 2023-09-16 MED ORDER — AMLODIPINE BESYLATE 2.5 MG PO TABS: 2.5000 mg | ORAL_TABLET | Freq: Every day | ORAL | 3 refills | Status: AC

## 2023-09-16 MED ORDER — HYDROXYZINE HCL 10 MG PO TABS: 10.0000 mg | ORAL_TABLET | Freq: Three times a day (TID) | ORAL | 1 refills | Status: AC | PRN

## 2023-09-16 NOTE — Assessment & Plan Note (Signed)
Stable on Lexapro 10 mg daily.  Will refill today. 

## 2023-09-16 NOTE — Assessment & Plan Note (Signed)
 Check labs

## 2023-09-16 NOTE — Patient Instructions (Signed)
 It was very nice to see you today!  We will refill your medications today.  Will check blood work.  Will see you back next year.  Come back sooner if needed.  Return in about 1 year (around 09/15/2024) for Annual Physical.   Take care, Dr Daneil Dunker  PLEASE NOTE:  If you had any lab tests, please let us  know if you have not heard back within a few days. You may see your results on mychart before we have a chance to review them but we will give you a call once they are reviewed by us .   If we ordered any referrals today, please let us  know if you have not heard from their office within the next week.   If you had any urgent prescriptions sent in today, please check with the pharmacy within an hour of our visit to make sure the prescription was transmitted appropriately.   Please try these tips to maintain a healthy lifestyle:  Eat at least 3 REAL meals and 1-2 snacks per day.  Aim for no more than 5 hours between eating.  If you eat breakfast, please do so within one hour of getting up.   Each meal should contain half fruits/vegetables, one quarter protein, and one quarter carbs (no bigger than a computer mouse)  Cut down on sweet beverages. This includes juice, soda, and sweet tea.   Drink at least 1 glass of water with each meal and aim for at least 8 glasses per day  Exercise at least 150 minutes every week.

## 2023-09-16 NOTE — Assessment & Plan Note (Signed)
 Stable on oxybutynin  5 mg 3 times daily.  Will refill today.

## 2023-09-16 NOTE — Progress Notes (Signed)
 Jean Bowen is a 78 y.o. female who presents today for an office visit.  Assessment/Plan:  Chronic Problems Addressed Today: Hypothyroidism On Synthroid  25 mcg daily.  Check TSH.  HYPERCHOLESTEROLEMIA Check lipids.  Discussed lifestyle modifications.  Essential hypertension Blood pressure at goal on amlodipine  2.5 mg daily.  GERD Stable on omeprazole  40 mg daily.  Check labs.  Overactive bladder Stable on oxybutynin  5 mg 3 times daily.  Will refill today.  Thrombocytopenia (HCC) Check labs.  Hyperparathyroidism (HCC) Check labs.  Hyperglycemia Check A1c.  Anemia Check CBC.  Major depressive disorder, single episode, in remission (HCC) Stable on Lexapro  10 mg daily.  Will refill today.  Pruritus Stable on hydroxyzine  as needed and topical triamcinolone  as needed.  Will refill today.  Preventative health care-check labs today.  No longer needs colon cancer screening due to age.  UpToDate on vaccines.    Subjective:  HPI:  See A/P for status of chronic conditions.  Patient is here today for annual visit.  I last saw her over a year ago.  She is doing well today.  No acute concerns.  Was seen recently in the ED for tick bites.  Still having some irritation to the area but triamcinolone  seems to be helping.  ROS: Per HPI, otherwise a complete review of systems was negative.   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   HYPERCHOLESTEROLEMIA 02/23/2010   Hyperplastic colon polyp 12/03/2006   HYPERTENSION 12/03/2006   OSA (obstructive sleep apnea)    OSTEOPOROSIS 12/03/2006   Sleep apnea    URINARY INCONTINENCE 12/03/2006   Patient Active  Problem List   Diagnosis Date Noted   Nodule of upper lobe of left lung 11/25/2021   Aortic aneurysm, thoracic (HCC) 11/25/2021   Anemia 06/25/2019   Hyperglycemia 03/19/2019   Hyperparathyroidism (HCC) 02/12/2015   Thrombocytopenia (HCC) 02/11/2015   Dysphagia 03/02/2011   HYPERCHOLESTEROLEMIA 02/23/2010   Pruritus 01/08/2010   GERD 03/03/2007   Hypothyroidism 12/03/2006   Major depressive disorder, single episode, in remission (HCC) 12/03/2006   Essential hypertension 12/03/2006   Osteoporosis 12/03/2006   Overactive bladder 12/03/2006   Past Surgical History:  Procedure Laterality Date   BUNIONECTOMY     both feet   CATARACT EXTRACTION     Bil   CHOLECYSTECTOMY     ELECTROCARDIOGRAM  02/07/2006   ESOPHAGOGASTRODUODENOSCOPY  11/17/2006   Hx of nasal septal reconstruction for deviated septum  1980's   KNEE ARTHROSCOPY     Bilateral-torn meniscus   SKIN CANCER EXCISION     non melanoma cancer removed from right leg   Stress Cardiolite  02/14/2006   TUMOR REMOVAL  2003   removal 5 lb benign tumor from abdomen    Family History  Problem Relation Age of Onset   Melanoma Mother    Lung cancer Mother    Colon cancer Father        at advanced age   Arthritis Father    Lung cancer Brother    Heart attack Other        grandfather   Diabetes Other        grandfather/grandmother    Medications-  reviewed and updated Current Outpatient Medications  Medication Sig Dispense Refill   acetaminophen  (TYLENOL ) 325 MG tablet Take 650 mg by mouth every 6 (six) hours as needed for mild pain.     ascorbic acid (VITAMIN C) 500 MG tablet Take 500 mg by mouth daily.     cholecalciferol  (VITAMIN D3) 25 MCG (1000 UNIT) tablet Take 1,000 Units by mouth daily.     triamcinolone  ointment (KENALOG ) 0.1 % Apply 1 Application topically 2 (two) times daily. 30 g 1   acyclovir  (ZOVIRAX ) 400 MG tablet Take 1 tablet (400 mg total) by mouth 2 (two) times daily. 180 tablet 3   amLODipine   (NORVASC ) 2.5 MG tablet Take 1 tablet (2.5 mg total) by mouth daily. 90 tablet 3   escitalopram  (LEXAPRO ) 10 MG tablet Take 1 tablet (10 mg total) by mouth daily. 90 tablet 3   hydrOXYzine  (ATARAX ) 10 MG tablet Take 1 tablet (10 mg total) by mouth 3 (three) times daily as needed for itching. 90 tablet 1   levothyroxine  (SYNTHROID ) 25 MCG tablet Take 1 tablet (25 mcg total) by mouth daily with breakfast. 90 tablet 3   omeprazole  (PRILOSEC) 40 MG capsule Take 1 capsule (40 mg total) by mouth daily. 90 capsule 3   oxybutynin  (DITROPAN ) 5 MG tablet Take 1 tablet (5 mg total) by mouth 3 (three) times daily. 270 tablet 3   triamcinolone  cream (KENALOG ) 0.1 % Apply 1 Application topically 2 (two) times daily. 450 g 0   No current facility-administered medications for this visit.    Allergies-reviewed and updated Allergies  Allergen Reactions   Vibra-Tab [Doxycycline] Swelling    Swelling of lips and throat   Losartan Potassium Swelling    Angioedema    Morphine And Codeine Nausea And Vomiting    Caused vomiting when given too fast in IV.    Social History   Socioeconomic History   Marital status: Widowed    Spouse name: Nyx Keady    Number of children: Not on file   Years of education: Not on file   Highest education level: Not on file  Occupational History   Occupation: Sales executive and travel  Tobacco Use   Smoking status: Former    Current packs/day: 0.00    Average packs/day: 2.0 packs/day for 30.0 years (60.0 ttl pk-yrs)    Types: Cigarettes    Start date: 05/23/1971    Quit date: 05/22/2001    Years since quitting: 22.3   Smokeless tobacco: Never  Vaping Use   Vaping status: Never Used  Substance and Sexual Activity   Alcohol use: Yes    Alcohol/week: 3.0 standard drinks of alcohol    Types: 3 Glasses of wine per week    Comment: 1-2/week   Drug use: No   Sexual activity: Not on file  Other Topics Concern   Not on file  Social History Narrative    1 dog   Regular exercise-yes   Husband died Sep 07, 2018     Social Drivers of Corporate investment banker Strain: Low Risk  (01/11/2023)   Overall Financial Resource Strain (CARDIA)    Difficulty of Paying Living Expenses: Not hard at all  Food Insecurity: No Food Insecurity (01/11/2023)   Hunger Vital Sign    Worried About Running Out of Food in the Last Year: Never true    Ran Out of Food in the Last Year: Never true  Transportation Needs: No Transportation Needs (01/11/2023)   PRAPARE - Transportation  Lack of Transportation (Medical): No    Lack of Transportation (Non-Medical): No  Physical Activity: Sufficiently Active (01/11/2023)   Exercise Vital Sign    Days of Exercise per Week: 7 days    Minutes of Exercise per Session: 30 min  Stress: Patient Declined (01/11/2023)   [employer] of Occupational Health - Occupational Stress Questionnaire    Feeling of Stress : Patient declined  Social Connections: Moderately Isolated (01/11/2023)   Social Connection and Isolation Panel [NHANES]    Frequency of Communication with Friends and Family: More than three times a week    Frequency of Social Gatherings with Friends and Family: More than three times a week    Attends Religious Services: Never    Database administrator or Organizations: Yes    Attends Banker Meetings: 1 to 4 times per year    Marital Status: Widowed          Objective:  Physical Exam: BP 132/89   Pulse 73   Temp (!) 97.3 F (36.3 C) (Temporal)   Ht 5\' 7"  (1.702 m)   Wt 172 lb (78 kg)   SpO2 96%   BMI 26.94 kg/m   Gen: No acute distress, resting comfortably CV: Regular rate and rhythm with no murmurs appreciated Pulm: Normal work of breathing, clear to auscultation bilaterally with no crackles, wheezes, or rhonchi Neuro: Grossly normal, moves all extremities Psych: Normal affect and thought content      Makinzy Cleere M. Daneil Dunker, MD 09/16/2023 11:52 AM

## 2023-09-16 NOTE — Assessment & Plan Note (Signed)
On Synthroid 25 mcg daily.  Check TSH.

## 2023-09-16 NOTE — Assessment & Plan Note (Signed)
 Stable on hydroxyzine  as needed and topical triamcinolone  as needed.  Will refill today.

## 2023-09-16 NOTE — Assessment & Plan Note (Signed)
 Check CBC

## 2023-09-16 NOTE — Assessment & Plan Note (Signed)
Blood pressure at goal on amlodipine 2.5 mg daily.

## 2023-09-16 NOTE — Assessment & Plan Note (Signed)
 Check A1c.

## 2023-09-16 NOTE — Assessment & Plan Note (Signed)
 Check lipids. Discussed lifestyle modifications.

## 2023-09-16 NOTE — Assessment & Plan Note (Signed)
 Stable on omeprazole  40 mg daily.  Check labs.

## 2023-09-18 LAB — PTH, INTACT (ICMA) AND IONIZED CALCIUM
Calcium, Ion: 5.1 mg/dL (ref 4.7–5.5)
Calcium: 9.3 mg/dL (ref 8.6–10.4)
PTH: 72 pg/mL (ref 16–77)

## 2023-09-19 ENCOUNTER — Ambulatory Visit: Payer: Self-pay | Admitting: Family Medicine

## 2023-09-19 NOTE — Progress Notes (Signed)
 Labs are all stable.  Do not need to make any changes to treatment plan at this time.  She should keep working on diet and exercise and we can recheck in a year.

## 2023-12-21 ENCOUNTER — Encounter: Payer: Self-pay | Admitting: Internal Medicine

## 2023-12-21 ENCOUNTER — Ambulatory Visit: Payer: Self-pay

## 2023-12-21 ENCOUNTER — Ambulatory Visit (INDEPENDENT_AMBULATORY_CARE_PROVIDER_SITE_OTHER): Admitting: Internal Medicine

## 2023-12-21 VITALS — BP 134/80 | HR 73 | Temp 98.1°F | Ht 67.0 in | Wt 172.0 lb

## 2023-12-21 DIAGNOSIS — R21 Rash and other nonspecific skin eruption: Secondary | ICD-10-CM

## 2023-12-21 LAB — COMPREHENSIVE METABOLIC PANEL WITH GFR
ALT: 11 U/L (ref 0–35)
AST: 15 U/L (ref 0–37)
Albumin: 4.1 g/dL (ref 3.5–5.2)
Alkaline Phosphatase: 62 U/L (ref 39–117)
BUN: 16 mg/dL (ref 6–23)
CO2: 33 meq/L — ABNORMAL HIGH (ref 19–32)
Calcium: 9.5 mg/dL (ref 8.4–10.5)
Chloride: 100 meq/L (ref 96–112)
Creatinine, Ser: 0.65 mg/dL (ref 0.40–1.20)
GFR: 84.1 mL/min (ref >60.00)
Glucose, Bld: 96 mg/dL (ref 70–99)
Potassium: 4.3 meq/L (ref 3.5–5.1)
Sodium: 141 meq/L (ref 135–145)
Total Bilirubin: 0.5 mg/dL (ref 0.2–1.2)
Total Protein: 7.4 g/dL (ref 6.0–8.3)

## 2023-12-21 LAB — CBC WITH DIFFERENTIAL/PLATELET
Basophils Absolute: 0 K/uL (ref 0.0–0.1)
Basophils Relative: 0.4 % (ref 0.0–3.0)
Eosinophils Absolute: 0.4 K/uL (ref 0.0–0.7)
Eosinophils Relative: 6 % — ABNORMAL HIGH (ref 0.0–5.0)
HCT: 44.2 % (ref 36.0–46.0)
Hemoglobin: 14.7 g/dL (ref 12.0–15.0)
Lymphocytes Relative: 24.8 % (ref 12.0–46.0)
Lymphs Abs: 1.5 K/uL (ref 0.7–4.0)
MCHC: 33.2 g/dL (ref 30.0–36.0)
MCV: 92.1 fl (ref 78.0–100.0)
Monocytes Absolute: 0.9 K/uL (ref 0.1–1.0)
Monocytes Relative: 14.1 % — ABNORMAL HIGH (ref 3.0–12.0)
Neutro Abs: 3.4 K/uL (ref 1.4–7.7)
Neutrophils Relative %: 54.7 % (ref 43.0–77.0)
Platelets: 236 K/uL (ref 150.0–400.0)
RBC: 4.8 Mil/uL (ref 3.87–5.11)
RDW: 15.5 % (ref 11.5–15.5)
WBC: 6.2 K/uL (ref 4.0–10.5)

## 2023-12-21 LAB — SEDIMENTATION RATE: Sed Rate: 14 mm/h (ref 0–30)

## 2023-12-21 LAB — C-REACTIVE PROTEIN: CRP: <1 mg/dL (ref 0.5–20.0)

## 2023-12-21 MED ORDER — PREDNISONE 10 MG PO TABS: ORAL_TABLET | ORAL | 0 refills | Status: AC

## 2023-12-21 NOTE — Patient Instructions (Addendum)
      Blood work was ordered.       Medications changes include :   prednisone taper    Return if symptoms worsen or fail to improve.

## 2023-12-21 NOTE — Progress Notes (Signed)
 Subjective:    Patient ID: Jean Bowen, female    DOB: 20-Nov-1945, 78 y.o.   MRN: [id number]      HPI Pleshette is here for  Chief Complaint  Patient presents with   Rash    Rash with blisters that are both legs and spread up to groin and underneath her bra    Discussed the use of AI scribe software for clinical note transcription with the patient, who gave verbal consent to proceed.  History of Present Illness Kristan Brummitt is a 78 year old female who presents with a spreading blister rash .  The rash began last Thursday or Friday, 5-6 days ago.  She had a small blister on the lateral distal dorsal aspect of her left foot.  No rash has progressively worsened and spread to both legs, groin, abdomen, and up to the bottom of her bra. She also noticed a blister on her shoulder today.  The rash is characterized by red dots and blisters, which are itchy and painful, and bothers her when trying to sleep.   No fever, headaches, body aches, sore throat, or other systemic symptoms.  No numbness, tingling, or burning sensations associated with the rash.  She denies any new joint pain, muscle pain or muscle weakness.  No oral sores.  No genital sores.  No dysuria.  She tried hydroxyzine  for itching,  and triamcinolone  cream, neither of which has helped.    She denies any new medications, products, or foods that could have triggered the rash.  She denies travel.  She denies sick contacts or exposure to new environments.     Medications and allergies reviewed with patient and updated if appropriate.  Current Outpatient Medications on File Prior to Visit  Medication Sig Dispense Refill   acetaminophen  (TYLENOL ) 325 MG tablet Take 650 mg by mouth every 6 (six) hours as needed for mild pain.     acyclovir  (ZOVIRAX ) 400 MG tablet Take 1 tablet (400 mg total) by mouth 2 (two) times daily. 180 tablet 3   amLODipine  (NORVASC ) 2.5 MG tablet Take 1 tablet (2.5 mg total) by mouth daily. 90 tablet 3    ascorbic acid (VITAMIN C) 500 MG tablet Take 500 mg by mouth daily.     cholecalciferol  (VITAMIN D3) 25 MCG (1000 UNIT) tablet Take 1,000 Units by mouth daily.     escitalopram  (LEXAPRO ) 10 MG tablet Take 1 tablet (10 mg total) by mouth daily. 90 tablet 3   hydrOXYzine  (ATARAX ) 10 MG tablet Take 1 tablet (10 mg total) by mouth 3 (three) times daily as needed for itching. 90 tablet 1   levothyroxine  (SYNTHROID ) 25 MCG tablet Take 1 tablet (25 mcg total) by mouth daily with breakfast. 90 tablet 3   omeprazole  (PRILOSEC) 40 MG capsule Take 1 capsule (40 mg total) by mouth daily. 90 capsule 3   oxybutynin  (DITROPAN ) 5 MG tablet Take 1 tablet (5 mg total) by mouth 3 (three) times daily. 270 tablet 3   triamcinolone  cream (KENALOG ) 0.1 % Apply 1 Application topically 2 (two) times daily. 450 g 0   triamcinolone  ointment (KENALOG ) 0.1 % Apply 1 Application topically 2 (two) times daily. 30 g 1   No current facility-administered medications on file prior to visit.    Review of Systems  Constitutional:  Negative for fever.  HENT:  Negative for congestion, mouth sores, sinus pressure and sore throat.   Respiratory:  Negative for cough, shortness of breath and wheezing.   Cardiovascular:  Negative for chest pain and palpitations.  Genitourinary:  Negative for dysuria.  Musculoskeletal:  Negative for myalgias.  Skin:  Positive for rash.  Neurological:  Negative for headaches.       Objective:   Vitals:   12/21/23 1411  BP: 134/80  Pulse: 73  Temp: 98.1 F (36.7 C)  SpO2: 93%   BP Readings from Last 3 Encounters:  12/21/23 134/80  09/16/23 132/89  09/13/23 136/88   Wt Readings from Last 3 Encounters:  12/21/23 172 lb (78 kg)  09/16/23 172 lb (78 kg)  09/13/23 172 lb (78 kg)   Body mass index is 26.94 kg/m.    Physical Exam Constitutional:      General: She is not in acute distress.    Appearance: Normal appearance. She is not ill-appearing.  HENT:     Head: Normocephalic  and atraumatic.  Skin:    General: Skin is warm and dry.     Findings: Rash present.     Comments: Rash present on bilateral lower extremities, abdomen up to breasts, possible new lesion on shoulder. Several blisters-different sizes-some of which are leaking yellowish serous fluid. Several erythematous patches with developing or possible residual blisters. No generalized erythema. No hive-like lesions. No infection noted  Neurological:     Mental Status: She is alert.              Assessment & Plan:    Encounter Diagnosis  Name Primary?   Blistering rash Yes   Acute - started one week ago Started on her left foot and has spread to both lower extremities, abdomen and up to her breasts, possible new lesion on the shoulder today Very itchy and a little painful No mucous membrane involvement No improvement with hydroxyzine  or triamcinolone  cream No sick contacts, travel or new medications/supplements or products History of chickenpox Not immunocompromised No exposures to suggest allergic reaction ?  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

## 2023-12-21 NOTE — Telephone Encounter (Signed)
 Appt today

## 2023-12-21 NOTE — Telephone Encounter (Signed)
 FYI Only or Action Required?: FYI only for provider.  Patient was last seen in primary care on 09/16/2023 by Kennyth Worth HERO, MD.  Called Nurse Triage reporting Rash, burning with rash, Pruritis, and blistering.  Symptoms began several days ago.  Interventions attempted: OTC medications: hydroxyzine .  Symptoms are: gradually worsening.  Triage Disposition: See HCP Within 4 Hours (Or PCP Triage)  Patient/caregiver understands and will follow disposition?: Yes     Copied from CRM #[id number]. Topic: Clinical - Red Word Triage >> Dec 21, 2023  1:13 PM Robinson H wrote: [facility] that prompted transfer to Nurse Triage: Rash all over body starting to spread, states it hurts burns everything. Reason for Disposition  Large or small blisters on skin (i.e., fluid filled bubbles or sacs)  Answer Assessment - Initial Assessment Questions 1. APPEARANCE of RASH: What does the rash look like? (e.g., blisters, dry flaky skin, red spots, redness, sores)     Basically single little dots, some have grown together, now have little blister effects, liquid and broken, got socks on to protect it, really ugly 2. SIZE: How big are the spots? (e.g., tip of pen, eraser, coin; inches, centimeters)     eraser 3. LOCATION: Where is the rash located?     Started on left foot now up left leg and creeping all over body, reached bottom of bra, it's killing me, on genitals yes but not on face 4. COLOR: What color is the rash? (Note: It is difficult to assess rash color in people with darker-colored skin. When this situation occurs, simply ask the caller to describe what they see.)     Reddish, goes from pink to having liquid filled blisters 5. ONSET: When did the rash begin?     Before labor day weekend, just little bit didn't think much of it 6. FEVER: Do you have a fever? If Yes, ask: What is your temperature, how was it measured, and when did it start?     No fever 7. ITCHING: Does the rash itch? If  Yes, ask: How bad is the itch? (Scale 1-10; or mild, moderate, severe)     Yeah oh yes oh yes 7-8/10 8. CAUSE: What do you think is causing the rash?     nope 9. MEDICINE FACTORS: Have you started any new medicines within the last 2 weeks? (e.g., antibiotics)      No 10. OTHER SYMPTOMS: Do you have any other symptoms? (e.g., dizziness, headache, sore throat, joint pain)       Burning wherever rash is 4-5/10 No headache, dizziness, joint pain, sore throat Tried hydroxyzine  didn't help Driving me nuts   Advised pt be examined in next 4 hours, no PCP availability, scheduled for this afternoon with [facility], confirmed location/appt info.  Protocols used: Rash or Redness - [facility]

## 2023-12-22 ENCOUNTER — Ambulatory Visit: Payer: Self-pay | Admitting: Internal Medicine

## 2023-12-22 DIAGNOSIS — S80861A Insect bite (nonvenomous), right lower leg, initial encounter: Secondary | ICD-10-CM | POA: Diagnosis not present

## 2023-12-22 DIAGNOSIS — Z85828 Personal history of other malignant neoplasm of skin: Secondary | ICD-10-CM | POA: Diagnosis not present

## 2023-12-25 LAB — ANTI-NUCLEAR AB-TITER (ANA TITER)
ANA TITER: 1:40 {titer} — ABNORMAL HIGH
ANA Titer 1: 1:80 {titer} — ABNORMAL HIGH

## 2023-12-25 LAB — ANA: Anti Nuclear Antibody (ANA): POSITIVE — AB

## 2024-01-17 DIAGNOSIS — L120 Bullous pemphigoid: Secondary | ICD-10-CM | POA: Diagnosis not present

## 2024-01-17 DIAGNOSIS — Z85828 Personal history of other malignant neoplasm of skin: Secondary | ICD-10-CM | POA: Diagnosis not present

## 2024-01-17 DIAGNOSIS — L308 Other specified dermatitis: Secondary | ICD-10-CM | POA: Diagnosis not present

## 2024-02-07 DIAGNOSIS — Z85828 Personal history of other malignant neoplasm of skin: Secondary | ICD-10-CM | POA: Diagnosis not present

## 2024-03-07 DIAGNOSIS — L308 Other specified dermatitis: Secondary | ICD-10-CM | POA: Diagnosis not present

## 2024-03-07 DIAGNOSIS — Z85828 Personal history of other malignant neoplasm of skin: Secondary | ICD-10-CM | POA: Diagnosis not present

## 2024-03-07 DIAGNOSIS — L120 Bullous pemphigoid: Secondary | ICD-10-CM | POA: Diagnosis not present

## 2024-03-21 DIAGNOSIS — Z85828 Personal history of other malignant neoplasm of skin: Secondary | ICD-10-CM | POA: Diagnosis not present

## 2024-03-21 DIAGNOSIS — L308 Other specified dermatitis: Secondary | ICD-10-CM | POA: Diagnosis not present

## 2024-03-26 ENCOUNTER — Other Ambulatory Visit: Payer: Self-pay | Admitting: Family Medicine

## 2024-03-26 NOTE — Telephone Encounter (Signed)
 Pharmacy comment: DUE TO SUPPLY CHAIN ISSUES, WE HAVE HAD TO CHANGE MANUFACTURERS FROM [facility] TO [facility]; BECAUSE THIS IS A NARROW THERAPEUTIC MEDICATION, WE MUST HAVE PROVIDER CONSENT; PATIENT MAY NEED FOLLOW-UP LABS.

## 2024-03-26 NOTE — Telephone Encounter (Signed)
 That is fine but recommend she come here in a few weeks to recheck TSH
